# Patient Record
Sex: Female | Born: 1955 | Race: White | Hispanic: No | Marital: Married | Smoking: Never smoker
Health system: Southern US, Community
[De-identification: ages and names within clinical notes are randomized; demographics above are authoritative.]

## PROBLEM LIST (undated history)

## (undated) DIAGNOSIS — J302 Other seasonal allergic rhinitis: Secondary | ICD-10-CM

## (undated) DIAGNOSIS — K219 Gastro-esophageal reflux disease without esophagitis: Secondary | ICD-10-CM

## (undated) DIAGNOSIS — C50919 Malignant neoplasm of unspecified site of unspecified female breast: Secondary | ICD-10-CM

## (undated) HISTORY — PX: VEIN SURGERY: SHX48

## (undated) HISTORY — PX: BREAST BIOPSY: SHX20

## (undated) HISTORY — PX: BREAST EXCISIONAL BIOPSY: SUR124

## (undated) HISTORY — PX: ABDOMINAL HYSTERECTOMY: SHX81

## (undated) HISTORY — PX: BREAST LUMPECTOMY: SHX2

---

## 2003-09-12 ENCOUNTER — Ambulatory Visit (HOSPITAL_BASED_OUTPATIENT_CLINIC_OR_DEPARTMENT_OTHER): Admission: RE | Admit: 2003-09-12 | Discharge: 2003-09-12 | Payer: Self-pay | Admitting: Family Medicine

## 2004-06-12 ENCOUNTER — Encounter: Admission: RE | Admit: 2004-06-12 | Discharge: 2004-06-12 | Payer: Self-pay | Admitting: Family Medicine

## 2004-08-17 ENCOUNTER — Ambulatory Visit (HOSPITAL_COMMUNITY): Admission: RE | Admit: 2004-08-17 | Discharge: 2004-08-17 | Payer: Self-pay | Admitting: Gastroenterology

## 2005-06-20 ENCOUNTER — Encounter: Admission: RE | Admit: 2005-06-20 | Discharge: 2005-06-20 | Payer: Self-pay | Admitting: Family Medicine

## 2005-09-11 ENCOUNTER — Encounter: Admission: RE | Admit: 2005-09-11 | Discharge: 2005-09-11 | Payer: Self-pay | Admitting: Family Medicine

## 2006-10-18 ENCOUNTER — Encounter: Admission: RE | Admit: 2006-10-18 | Discharge: 2006-10-18 | Payer: Self-pay | Admitting: Family Medicine

## 2007-10-22 ENCOUNTER — Ambulatory Visit (HOSPITAL_COMMUNITY): Admission: RE | Admit: 2007-10-22 | Discharge: 2007-10-22 | Payer: Self-pay | Admitting: Family Medicine

## 2008-10-25 ENCOUNTER — Ambulatory Visit (HOSPITAL_COMMUNITY): Admission: RE | Admit: 2008-10-25 | Discharge: 2008-10-25 | Payer: Self-pay | Admitting: Family Medicine

## 2008-11-01 ENCOUNTER — Encounter: Admission: RE | Admit: 2008-11-01 | Discharge: 2008-11-01 | Payer: Self-pay | Admitting: Family Medicine

## 2009-04-25 ENCOUNTER — Encounter: Admission: RE | Admit: 2009-04-25 | Discharge: 2009-04-25 | Payer: Self-pay | Admitting: Family Medicine

## 2009-06-21 ENCOUNTER — Emergency Department (HOSPITAL_COMMUNITY): Admission: EM | Admit: 2009-06-21 | Discharge: 2009-06-21 | Payer: Self-pay | Admitting: Family Medicine

## 2009-11-09 ENCOUNTER — Encounter: Admission: RE | Admit: 2009-11-09 | Discharge: 2009-11-09 | Payer: Self-pay | Admitting: Family Medicine

## 2010-06-09 NOTE — Procedures (Signed)
NAME:  Kendra Norman, Kendra Norman           ACCOUNT NO.:  [id number]   MEDICAL RECORD NO.:  [id number]          PATIENT TYPE:  OUT   LOCATION:  SLEEP CENTER                 FACILITY:  [facility]   PHYSICIAN:  Clinton D. Maple Hudson, M.D. DATE OF BIRTH:  [birth year]   DATE OF ADMISSION:  09/12/2003  DATE OF DISCHARGE:  09/12/2003                              NOCTURNAL POLYSOMNOGRAM   REFERRING PHYSICIAN:  Dr. Maryelizabeth Rowan   INDICATION FOR STUDY:  Hypersomnia with sleep apnea, complaint of snoring.   EPWORTH SLEEPINESS SCORE:  10/24   NECK SIZE:  14 inches   BODY MASS INDEX:  27   WEIGHT:  190 pounds   MEDICATION LIST:  Prilosec, Zyrtec, Estratest, Naprosyn, multivitamins,  Tylenol, Flex-a-min.  No sleep med was taken for this study.   SLEEP ARCHITECTURE:  Total sleep time 338 minutes with sleep efficiency 65%.  Stage I was 15%, Stage II 68%, Stages III and IV 5%, REM was 12% of total  sleep time.  Latency to sleep onset 118 minutes.  Latency to REM was 223  minutes.  Awake after sleep onset 72 minutes.  Arousal index 36.9/hr.   RESPIRATORY DATA:  RDI 2.5/hr reflecting a total of 14 hypopneas.  This is  within normal limits.  Events were not positional.  She slept mostly on her  left side.  REM RDI was 2.9/hr.   OXYGEN DATA:  Mild snoring with normal oxygenation.  Mean oxygen saturation  through the study was 98% and lowest recorded saturation was 91%.   CARDIAC DATA:  Normal sinus rhythm.   MOVEMENT/PARASOMNIA:  One hundred fourteen limb jerks were recorded of which  27 were associated with arousal or awakening for a periodic limb movement  with arousal index of 4.8/hr.  Bathroom x1.   IMPRESSION/RECOMMENDATION:  Unremarkable respiratory pattern with occasional  hypopneas, respiratory disturbance index 2.5/hr which is within normal  limits.  Mild snoring and normal oxygenation.  Sleep was quite fragmented by  nonspecific brief arousals and awakenings.  If this is representative of her  home pattern she may  benefit from treatment for insomnia.  Periodic limb movement syndrome was  recorded with an arousal index of 4.8/hr.  Consider a therapeutic trial of  clonazepam or Requip if this appears clinically significant.                                   ______________________________                                Rennis Chris. Maple Hudson, M.D.                                Diplomate, American Board of Sleep Medicine    CDY/MEDQ  D:  09/19/2003 10:24:10  T:  09/20/2003 12:03:35  Job:  [id number]

## 2010-10-06 ENCOUNTER — Other Ambulatory Visit (HOSPITAL_COMMUNITY): Payer: Self-pay | Admitting: Family Medicine

## 2010-10-06 DIAGNOSIS — Z1231 Encounter for screening mammogram for malignant neoplasm of breast: Secondary | ICD-10-CM

## 2010-11-14 ENCOUNTER — Ambulatory Visit (HOSPITAL_COMMUNITY)
Admission: RE | Admit: 2010-11-14 | Discharge: 2010-11-14 | Disposition: A | Discharge status: Home / Self Care | Source: Ambulatory Visit | Attending: Family Medicine | Admitting: Family Medicine

## 2010-11-14 DIAGNOSIS — Z1231 Encounter for screening mammogram for malignant neoplasm of breast: Secondary | ICD-10-CM | POA: Insufficient documentation

## 2010-11-23 ENCOUNTER — Inpatient Hospital Stay (INDEPENDENT_AMBULATORY_CARE_PROVIDER_SITE_OTHER)
Admission: RE | Admit: 2010-11-23 | Discharge: 2010-11-23 | Disposition: A | Discharge status: Home / Self Care | Source: Ambulatory Visit | Attending: Family Medicine | Admitting: Family Medicine

## 2010-11-23 DIAGNOSIS — M7020 Olecranon bursitis, unspecified elbow: Secondary | ICD-10-CM

## 2011-04-25 ENCOUNTER — Emergency Department (INDEPENDENT_AMBULATORY_CARE_PROVIDER_SITE_OTHER)

## 2011-04-25 ENCOUNTER — Emergency Department (INDEPENDENT_AMBULATORY_CARE_PROVIDER_SITE_OTHER)
Admission: EM | Admit: 2011-04-25 | Discharge: 2011-04-25 | Disposition: A | Discharge status: Home / Self Care | Source: Home / Self Care | Attending: Emergency Medicine | Admitting: Emergency Medicine

## 2011-04-25 ENCOUNTER — Encounter (HOSPITAL_COMMUNITY): Payer: Self-pay | Admitting: Emergency Medicine

## 2011-04-25 DIAGNOSIS — IMO0002 Reserved for concepts with insufficient information to code with codable children: Secondary | ICD-10-CM

## 2011-04-25 DIAGNOSIS — S62619A Displaced fracture of proximal phalanx of unspecified finger, initial encounter for closed fracture: Secondary | ICD-10-CM

## 2011-04-25 HISTORY — DX: Gastro-esophageal reflux disease without esophagitis: K21.9

## 2011-04-25 HISTORY — DX: Other seasonal allergic rhinitis: J30.2

## 2011-04-25 MED ORDER — IBUPROFEN 600 MG PO TABS: 600.0000 mg | ORAL_TABLET | Freq: Four times a day (QID) | ORAL | Status: AC | PRN

## 2011-04-25 MED ORDER — OXYCODONE-ACETAMINOPHEN 5-325 MG PO TABS: 1.0000 | ORAL_TABLET | Freq: Four times a day (QID) | ORAL | Status: AC | PRN

## 2011-04-25 NOTE — ED Provider Notes (Signed)
History     CSN: [id number]  Arrival date & time 04/25/11  0800   First MD Initiated Contact with Patient 04/25/11 0805      Chief Complaint  Patient presents with  . Hand Pain    (Consider location/radiation/quality/duration/timing/severity/associated sxs/prior treatment) HPI Comments: Patient is a right-handed female who states that she was running, and fell on the lateral aspect of her left hand last night. Also sustained multiple superficial abrasions on arm, legs. Now reports pain, bruising, swelling at the fifth metacarpal and at the left little finger. No numbness, paresthesias, weakness. Recent hand, wrist, forearm, elbow without injury. Patient has been applying ice and took Tylenol last night with mild relief. Pain is worse with use and palpation.  ROS as noted in HPI. All other ROS negative.   Patient is a 56 y.o. female presenting with hand pain. The history is provided by the patient.  Hand Pain This is a new problem. The current episode started yesterday. The problem occurs constantly. The problem has not changed since onset.She has tried acetaminophen for the symptoms.    Past Medical History  Diagnosis Date  . GERD (gastroesophageal reflux disease)   . Seasonal allergies     Past Surgical History  Procedure Date  . Abdominal hysterectomy     History reviewed. No pertinent family history.  History  Substance Use Topics  . Smoking status: Never Smoker   . Smokeless tobacco: Not on file  . Alcohol Use: Yes    OB History    Grav Para Term Preterm Abortions TAB SAB Ect Mult Living                  Review of Systems  Allergies  Review of patient's allergies indicates no known allergies.  Home Medications   Current Outpatient Rx  Name Route Sig Dispense Refill  . EST ESTROGENS-METHYLTEST 0.625-1.25 MG PO TABS Oral Take 1 tablet by mouth daily.    Marland Kitchen OMEPRAZOLE 10 MG PO CPDR Oral Take 10 mg by mouth daily.    Marland Kitchen PREDNISONE 2.5 MG PO TABS Oral Take  2.5 mg by mouth daily.    . IBUPROFEN 600 MG PO TABS Oral Take 1 tablet (600 mg total) by mouth every 6 (six) hours as needed for pain. 30 tablet 0  . OXYCODONE-ACETAMINOPHEN 5-325 MG PO TABS Oral Take 1-2 tablets by mouth every 6 (six) hours as needed for pain. 20 tablet 0    BP 124/77  Pulse 84  Temp(Src) 97 F (36.1 C) (Oral)  Resp 18  SpO2 100%  Physical Exam  Nursing note and vitals reviewed. Constitutional: She is oriented to person, place, and time. She appears well-developed and well-nourished. No distress.  HENT:  Head: Normocephalic and atraumatic.  Eyes: Conjunctivae and EOM are normal.  Neck: Normal range of motion.  Cardiovascular: Normal rate.   Pulmonary/Chest: Effort normal.  Abdominal: She exhibits no distension.  Musculoskeletal: Normal range of motion.       Hands:      Bruising, tenderness at fifth MCP and base of left little finger, PIP. Motor and sensation otherwise intact in median/radial/ulnar distribution. Refill is 2 seconds. Wrist and hand, wrist, forearm, elbow within normal limits.  Neurological: She is alert and oriented to person, place, and time.  Skin: Skin is warm and dry.  Psychiatric: She has a normal mood and affect. Her behavior is normal. Judgment and thought content normal.    ED Course  Procedures (including critical care time)  Labs  Reviewed - No data to display Dg Hand Complete Left  04/25/2011  *RADIOLOGY REPORT*  Clinical Data: Fall, pain, swelling laterally  LEFT HAND - COMPLETE 3+ VIEW  Comparison: None.  Findings: There is an acute transverse nondisplaced fracture of the left fifth finger proximal phalanx close to the MCP joint. Fracture does not appear intra-articular.  No associated subluxation or dislocation.  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  IMPRESSION: Acute nondisplaced fracture left fifth proximal phalanx.  Osteoarthritis  Original Report Authenticated By: Judie Petit. Ruel Favors, M.D.     1. Fracture of proximal phalanx of left hand     X-ray reviewed by myself. Nondisplaced Fracture base of proximal phalanx. Full report per radiologist  MDM  Patient has bruising, tenderness at MCP, PIP. Will place in a ulnar gutter splint, even if patient has no fracture Patient declined medication. X-ray to rule out fracture.  Discussed imaging results with the patient. Will have her followup with Dr. Mina Marble, hand on call, in several days. Patient agrees with plan  Luiz Blare, MD 04/25/11 [phone]

## 2011-04-25 NOTE — Discharge Instructions (Signed)
Take the medication as written. Take 1 gram of tylenol with the motrin up to 4 times a day as needed for pain and fever. This is an effective combination for pain. Take the percocet only for severe pain. Do not take the tylenol and percocet as they both have tylenol in them and too much can hurt your liver. Return if you get worse, have a  fever >100.4, or for any concerns.   Go to www.goodrx.com to look up your medications. This will give you a list of where you can find your prescriptions at the most affordable prices.

## 2011-04-25 NOTE — ED Notes (Signed)
Ppt. Stated, I was running and I fell and i have some abrasions everywhere, but my lt. Little finger hurts the worse

## 2011-10-11 ENCOUNTER — Ambulatory Visit (INDEPENDENT_AMBULATORY_CARE_PROVIDER_SITE_OTHER): Admitting: Sports Medicine

## 2011-10-11 ENCOUNTER — Ambulatory Visit
Admission: RE | Admit: 2011-10-11 | Discharge: 2011-10-11 | Disposition: A | Discharge status: Home / Self Care | Source: Ambulatory Visit | Attending: Sports Medicine | Admitting: Sports Medicine

## 2011-10-11 VITALS — BP 110/70 | Ht 70.0 in | Wt 186.0 lb

## 2011-10-11 DIAGNOSIS — M545 Low back pain, unspecified: Secondary | ICD-10-CM

## 2011-10-12 NOTE — Progress Notes (Addendum)
  Subjective:    Patient ID: Kendra Norman, female    DOB: 11/01/1955, 56 y.o.   MRN: [id number]  HPI chief complaint: Low back and left hamstring pain  56 year old female comes in today complaining of several months of low back pain and left hamstring pain. Back in 2009 she had an episode of low back pain that responded to oral prednisone. Was asymptomatic up until last October. While running her left leg gave way and she fell hyperextending her left knee and hyperflexing her left hip. Since that time she's had intermittent pain is worse with activity. She enjoys running and noted the pain is worse with running [place]. She also has pain with going from a seated to standing position. Pain is aching in quality and localized across the low back. Pain will radiate into the left hamstring and down to the left knee. No radiating pain past the knee. No associated numbness or tingling. No groin pain. Patient states that she had x-rays back in 2009 which were normal per her report. They were x-rays of her lumbar spine. Her primary care physician has recently placed her on oral prednisone but has been only minimally effective. She takes 500 mg of Naprosyn daily and this does seem to help somewhat her pain.  Medical history significant for reflux disease, restless leg syndrome, and seasonal allergies. Medications include Prilosec, Estratest Claritin-D, Naprosyn, glucosamine, aspirin, calcium, multivitamin, Colace, and MiraLax. She also takes Valium 2.5 mg when necessary for muscle spasm No known drug allergies Socially she does not smoke, drinks alcohol on occasion. And works as an Charity fundraiser for [employer]    Review of Systems     Objective:   Physical Exam Well-developed, well-nourished. No acute distress. Awake alert and oriented x3. Vital signs are reviewed.  Lumbar spine shows full lumbar mobility. Left-sided low back pain with extension. No tenderness to palpation or percussion along the lumbar midline. No  spasm. Left hip demonstrates smooth painless hip range of motion with a negative log roll. No tenderness over the greater trochanteric bursa. She has a negative straight leg raise bilaterally. Strength is 5/5 both lower extremities. Reflexes are trace but equal at the Achilles and patellar tendons bilaterally. Sensation is intact to light-touch distally. Left hamstring shows no palpable tenderness or defect. Good hamstring strength. No tenderness at the ischial tuberosity.        Assessment & Plan:  1. Low back pain with radiating pain into the left hamstring  I am suspicious that facet arthropathy may be responsible for her discomfort. She has no pain with flexion of her lumbar spine and all of her symptoms seem to be reproducible with extension. I've asked that she modify her workouts and avoid any activity that places her back and extension. I think she should also try to avoid hills when running. I'm going to get an AP and lateral x-ray of her lumbar spine and I'll see her back in the office in 4 weeks. If her symptoms persist and her pain warrants, we may consider merits of further diagnostic imaging of her lumbar spine.

## 2011-10-23 ENCOUNTER — Telehealth: Payer: Self-pay | Admitting: *Deleted

## 2011-10-23 NOTE — Telephone Encounter (Signed)
Message copied by Mora Bellman on Tue Oct 23, 2011  3:29 PM ------      Message from: Ralene Cork      Created: Tue Oct 23, 2011  2:51 PM      Regarding: RE: Herby Abraham RESULTS      Contact: [phone]       Xrays show a little arthritis but not bad. Tell her to be sure to follow up with me as scheduled.      ----- Message -----         From: Mora Bellman, RN         Sent: 10/23/2011  10:35 AM           To: Ralene Cork, DO      Subject: Annell Greening: XRAY RESULTS                                                     ----- Message -----         From: Lizbeth Bark         Sent: 10/23/2011  10:06 AM           To: Mora Bellman, RN      Subject: XRAY RESULTS                                             Pt would like her xray results.  She saw Dr. Margaretha Sheffield

## 2011-10-23 NOTE — Telephone Encounter (Signed)
Spoke with pt- gave her x-ray results.  She states her back is still bothering her, she will discuss with Dr. Margaretha Sheffield 11/07/11.

## 2011-10-27 ENCOUNTER — Encounter (HOSPITAL_COMMUNITY): Payer: Self-pay | Admitting: *Deleted

## 2011-10-27 ENCOUNTER — Emergency Department (INDEPENDENT_AMBULATORY_CARE_PROVIDER_SITE_OTHER)
Admission: EM | Admit: 2011-10-27 | Discharge: 2011-10-27 | Disposition: A | Discharge status: Home / Self Care | Source: Home / Self Care | Attending: Family Medicine | Admitting: Family Medicine

## 2011-10-27 DIAGNOSIS — M545 Low back pain, unspecified: Secondary | ICD-10-CM

## 2011-10-27 DIAGNOSIS — M62830 Muscle spasm of back: Secondary | ICD-10-CM

## 2011-10-27 DIAGNOSIS — M5380 Other specified dorsopathies, site unspecified: Secondary | ICD-10-CM

## 2011-10-27 MED ORDER — NAPROXEN 500 MG PO TABS: 500.0000 mg | ORAL_TABLET | Freq: Two times a day (BID) | ORAL | Status: DC

## 2011-10-27 MED ORDER — HYDROCODONE-ACETAMINOPHEN 5-500 MG PO TABS: 1.0000 | ORAL_TABLET | Freq: Three times a day (TID) | ORAL | Status: DC | PRN

## 2011-10-27 MED ORDER — CYCLOBENZAPRINE HCL 10 MG PO TABS: 10.0000 mg | ORAL_TABLET | Freq: Two times a day (BID) | ORAL | Status: DC | PRN

## 2011-10-27 NOTE — ED Provider Notes (Signed)
History     CSN: [id number]  Arrival date & time 10/27/11  1041   First MD Initiated Contact with Patient 10/27/11 1139      Chief Complaint  Patient presents with  . Back Pain    (Consider location/radiation/quality/duration/timing/severity/associated sxs/prior treatment) HPI Comments: 56 year old female with history of chronic low back pain. Comes today complaining of low back pain exacerbation since yesterday. Patient reports she woke up with pain in both sides of her low back but more on the left. Pain worse and with movement. Denies dysuria, frequency or hematuria. No headache fever or chills. She denies any known injury or recent falls. No recent pulling or lifting. She is a runner but has been cutting on her running routine due to intermittent back pain episodes. She's been followed by a orthopedist for chronic back pain. Has taken naproxen heating pads with short lived relief. Denies pain radiation to lower extremities. Denies  numbnes low extremity numbness,  tingling or weakness   Past Medical History  Diagnosis Date  . GERD (gastroesophageal reflux disease)   . Seasonal allergies     Past Surgical History  Procedure Date  . Abdominal hysterectomy   . Vein surgery   . Breast biopsy     No family history on file.  History  Substance Use Topics  . Smoking status: Never Smoker   . Smokeless tobacco: Not on file  . Alcohol Use: Yes    OB History    Grav Para Term Preterm Abortions TAB SAB Ect Mult Living                  Review of Systems  Constitutional: Negative for fever, chills and appetite change.  Gastrointestinal: Negative for nausea, vomiting, abdominal pain and diarrhea.  Genitourinary: Negative for dysuria, hematuria and flank pain.  Musculoskeletal: Positive for back pain.  Skin: Negative for rash.  Neurological: Negative for headaches.  All other systems reviewed and are negative.    Allergies  Review of patient's allergies indicates no  known allergies.  Home Medications   Current Outpatient Rx  Name Route Sig Dispense Refill  . ASPIRIN 81 MG PO TABS Oral Take 81 mg by mouth daily.    . CYCLOBENZAPRINE HCL 10 MG PO TABS Oral Take 1 tablet (10 mg total) by mouth 2 (two) times daily as needed for muscle spasms. 20 tablet 0  . EST ESTROGENS-METHYLTEST 0.625-1.25 MG PO TABS Oral Take 1 tablet by mouth daily.    Marland Kitchen HYDROCODONE-ACETAMINOPHEN 5-500 MG PO TABS Oral Take 1 tablet by mouth every 8 (eight) hours as needed for pain. 15 tablet 0  . NAPROXEN 500 MG PO TABS Oral Take 1 tablet (500 mg total) by mouth 2 (two) times daily with a meal. 20 tablet 0  . OMEPRAZOLE 10 MG PO CPDR Oral Take 10 mg by mouth daily.    Marland Kitchen PREDNISONE 2.5 MG PO TABS Oral Take 2.5 mg by mouth daily.      BP 120/67  Pulse 81  Temp 97.9 F (36.6 C) (Oral)  Resp 16  SpO2 100%  Physical Exam  Nursing note and vitals reviewed. Constitutional: She is oriented to person, place, and time. She appears well-developed and well-nourished. No distress.  HENT:  Head: Normocephalic and atraumatic.  Neck: Neck supple.  Cardiovascular: Normal heart sounds.   Pulmonary/Chest: Breath sounds normal.  Abdominal: Soft. There is no tenderness.       No CVT  Musculoskeletal:       Spine central:  No obvious scoliosis or kyphosis. No pain over bone processes in entire spine.  Good range of motion.  Able to bend forward (flexion) past 90 degrees and posteriorly (extension) 25 degrees.  Normal bilateral lateralization 25 degress. Despite good range of motion there is reported pain in left lower lateral back with lateralization towards left side specially if rotation of torso in same direction at same time. Increased muscle tone and tenderness to palpation in low lumbar  Paravertebral muscles.      Neurological: She is alert and oriented to person, place, and time.       Symmtric DTRs bilateral in lower extremities. No saddle anesthesia Intact superficial and deep  proprioception in bilateral lower extremities.  Skin: No rash noted.    ED Course  Procedures (including critical care time)  Labs Reviewed - No data to display No results found.   1. Low back pain   2. Spasm of muscle, back       MDM  Normal neurologic examination. Prescribed Flexeril and Vicodin. Refilled naproxen. Low back rehabilitation exercises discussed with patient and provided in writing. Supportive/preventive care discussed with patient and provided in writing. Asked to followup with her orthopedist if not improving or worsening symptoms despite following treatment        Sharin Grave, MD 10/30/11 1146

## 2011-10-27 NOTE — ED Notes (Signed)
Pt  Reports     Low  Back  Pain         - she  Reports       She  Woke up  With  The  Symptoms       This  Am       She  denys  Any  spedefic  Injury       She  Reports  Symptoms  Not  releived   By  otc  meds      And  Heat     -  Pt  Has  History  Of  Back  Problems  In  Past          - pt  denys  Any  Urinary  Symptoms      -  Pt         Reports  Pain is  Worse  On  Movement

## 2011-10-29 ENCOUNTER — Other Ambulatory Visit (HOSPITAL_COMMUNITY): Payer: Self-pay | Admitting: Family Medicine

## 2011-10-29 DIAGNOSIS — Z1231 Encounter for screening mammogram for malignant neoplasm of breast: Secondary | ICD-10-CM

## 2011-10-30 ENCOUNTER — Ambulatory Visit (INDEPENDENT_AMBULATORY_CARE_PROVIDER_SITE_OTHER): Admitting: Sports Medicine

## 2011-10-30 ENCOUNTER — Encounter: Payer: Self-pay | Admitting: Sports Medicine

## 2011-10-30 VITALS — BP 146/90 | HR 83 | Ht 70.0 in | Wt 186.0 lb

## 2011-10-30 DIAGNOSIS — M545 Low back pain, unspecified: Secondary | ICD-10-CM

## 2011-10-30 NOTE — Progress Notes (Signed)
  Subjective:    Patient ID: Kendra Norman, female    DOB: [birth year], 56 y.o.   MRN: [id number]  HPI Patient comes in today with worsening low back pain. Recent x-rays showed some minimal degenerative changes but nothing severe. She had an acute episode of back spasms which required a visit to the urgent care last weekend. She was given prescriptions for Naprosyn, Flexeril, and Vicodin. She has not been taking the Vicodin. Her pain is worse with activity particularly with running even though she may be modifications I suggested and has been running a softer surface and avoiding hills. Still getting pain in the left hamstring with running as well but no associated numbness or tingling.    Review of Systems     Objective:   Physical Exam Well-developed, well-nourished. No acute distress. Awake alert and oriented x3  Lumbar spine shows full range of motion but pain with flexion and extension. She has a mild amount of spasm diffusely of the paraspinal muscles. Positive straight leg raise on the left, negative on the right.. Strength is 5/5 both lower extremities. Reflexes are trace but equal at the Achilles and patellar tendons bilaterally. Sensation is intact to light-touch grossly. Palpation of the left hamstring shows no palpable defect. No tenderness to palpation. No soft tissue swelling. Patient walks without a severe limp       Assessment & Plan:  1. 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 After reviewing that study I will give her a call. The pain in her left hamstring may be referred from her lumbar spine. We may discuss the merits of a diagnostic/therapeutic lumbar ESI. She will continue on her Naprosyn and when necessary Flexeril. She will avoid aggravating activities and we will delineate further treatment based on her MRI findings.

## 2011-10-30 NOTE — Patient Instructions (Addendum)
You have been scheduled for an appointment for MRI of your lumbar spine on 11/01/11 at 1pm at [facility].  Please arrive at radiology on 1st floor at 12:45pm.    Dr. Margaretha Sheffield will contact you with MRI results  Thank you for seeing [place] today!

## 2011-11-01 ENCOUNTER — Ambulatory Visit (HOSPITAL_COMMUNITY)
Admission: RE | Admit: 2011-11-01 | Discharge: 2011-11-01 | Disposition: A | Discharge status: Home / Self Care | Source: Ambulatory Visit | Attending: Sports Medicine | Admitting: Sports Medicine

## 2011-11-01 DIAGNOSIS — IMO0002 Reserved for concepts with insufficient information to code with codable children: Secondary | ICD-10-CM | POA: Insufficient documentation

## 2011-11-01 DIAGNOSIS — M545 Low back pain, unspecified: Secondary | ICD-10-CM

## 2011-11-01 DIAGNOSIS — D1809 Hemangioma of other sites: Secondary | ICD-10-CM | POA: Insufficient documentation

## 2011-11-05 ENCOUNTER — Telehealth: Payer: Self-pay | Admitting: *Deleted

## 2011-11-05 ENCOUNTER — Telehealth: Payer: Self-pay | Admitting: Sports Medicine

## 2011-11-05 NOTE — Telephone Encounter (Signed)
I spoke with the patient on the phone today regarding MRI findings of her lumbar spine. She has multilevel degenerative disc disease with some mild foraminal impingement but nothing marked. I still think she may benefit from a lumbar ESI both for diagnostic as well as therapeutic reasons. At this point, I've elected to refer her to Dr. Maurice Small for his input. I will refer her for evaluation and treatment and I will defer further treatment to his discretion.

## 2011-11-05 NOTE — Telephone Encounter (Signed)
Message copied by Mora Bellman on Mon Nov 05, 2011  4:14 PM ------      Message from: Ralene Cork      Created: Mon Nov 05, 2011  1:27 PM      Regarding: dr Maurice Small       Please refer to Dr Maurice Small for evaluation and treatment of lumbar DDD      ----- Message -----         From: Rad Results In Interface         Sent: 11/01/2011   2:31 PM           To: Ralene Cork, DO

## 2011-11-05 NOTE — Telephone Encounter (Signed)
Referred tues 11/12 @ 1:30pm for paperwork  Dr. Maurice Small @ [facility] Left pt a VM regarding appt info.

## 2011-11-07 ENCOUNTER — Ambulatory Visit: Admitting: Sports Medicine

## 2011-11-07 NOTE — Telephone Encounter (Signed)
Called back to [facility]- they said have pt call Monday and might be able to work her in this Tues with Dr. Maurice Small.  If not- moved her appt to 11/29/11 at 9am.  Pt notified.

## 2011-11-20 ENCOUNTER — Ambulatory Visit (HOSPITAL_COMMUNITY)
Admission: RE | Admit: 2011-11-20 | Discharge: 2011-11-20 | Disposition: A | Discharge status: Home / Self Care | Source: Ambulatory Visit | Attending: Family Medicine | Admitting: Family Medicine

## 2011-11-20 DIAGNOSIS — Z1231 Encounter for screening mammogram for malignant neoplasm of breast: Secondary | ICD-10-CM | POA: Insufficient documentation

## 2012-01-18 ENCOUNTER — Ambulatory Visit: Attending: Physical Medicine and Rehabilitation | Admitting: Physical Therapy

## 2012-01-18 DIAGNOSIS — M545 Low back pain, unspecified: Secondary | ICD-10-CM | POA: Insufficient documentation

## 2012-01-18 DIAGNOSIS — IMO0001 Reserved for inherently not codable concepts without codable children: Secondary | ICD-10-CM | POA: Insufficient documentation

## 2012-01-21 ENCOUNTER — Ambulatory Visit: Admitting: Physical Therapy

## 2012-01-25 ENCOUNTER — Ambulatory Visit: Attending: Physical Medicine and Rehabilitation

## 2012-01-25 DIAGNOSIS — M545 Low back pain, unspecified: Secondary | ICD-10-CM | POA: Insufficient documentation

## 2012-01-25 DIAGNOSIS — IMO0001 Reserved for inherently not codable concepts without codable children: Secondary | ICD-10-CM | POA: Insufficient documentation

## 2012-01-29 ENCOUNTER — Ambulatory Visit: Admitting: Physical Therapy

## 2012-02-01 ENCOUNTER — Ambulatory Visit: Admitting: Physical Therapy

## 2012-02-04 ENCOUNTER — Ambulatory Visit: Admitting: Physical Therapy

## 2012-02-07 ENCOUNTER — Ambulatory Visit: Admitting: Physical Therapy

## 2012-02-12 ENCOUNTER — Ambulatory Visit: Admitting: Physical Therapy

## 2012-02-15 ENCOUNTER — Ambulatory Visit: Admitting: Physical Therapy

## 2012-02-19 ENCOUNTER — Ambulatory Visit: Admitting: Physical Therapy

## 2012-02-26 ENCOUNTER — Ambulatory Visit: Attending: Sports Medicine | Admitting: Physical Therapy

## 2012-02-26 DIAGNOSIS — IMO0001 Reserved for inherently not codable concepts without codable children: Secondary | ICD-10-CM | POA: Insufficient documentation

## 2012-02-26 DIAGNOSIS — M545 Low back pain, unspecified: Secondary | ICD-10-CM | POA: Insufficient documentation

## 2012-03-04 ENCOUNTER — Ambulatory Visit: Admitting: Physical Therapy

## 2012-03-08 ENCOUNTER — Other Ambulatory Visit: Payer: Self-pay

## 2012-10-27 ENCOUNTER — Other Ambulatory Visit (HOSPITAL_COMMUNITY): Payer: Self-pay | Admitting: Family Medicine

## 2012-10-27 DIAGNOSIS — Z1231 Encounter for screening mammogram for malignant neoplasm of breast: Secondary | ICD-10-CM

## 2012-11-21 ENCOUNTER — Ambulatory Visit (HOSPITAL_COMMUNITY)
Admission: RE | Admit: 2012-11-21 | Discharge: 2012-11-21 | Disposition: A | Discharge status: Home / Self Care | Source: Ambulatory Visit | Attending: Family Medicine | Admitting: Family Medicine

## 2012-11-21 DIAGNOSIS — Z1231 Encounter for screening mammogram for malignant neoplasm of breast: Secondary | ICD-10-CM | POA: Insufficient documentation

## 2012-11-27 ENCOUNTER — Other Ambulatory Visit: Payer: Self-pay

## 2013-07-09 ENCOUNTER — Ambulatory Visit: Attending: Family Medicine | Admitting: Physical Therapy

## 2013-07-09 DIAGNOSIS — M545 Low back pain, unspecified: Secondary | ICD-10-CM | POA: Insufficient documentation

## 2013-07-09 DIAGNOSIS — IMO0001 Reserved for inherently not codable concepts without codable children: Secondary | ICD-10-CM | POA: Insufficient documentation

## 2013-07-09 DIAGNOSIS — M25659 Stiffness of unspecified hip, not elsewhere classified: Secondary | ICD-10-CM | POA: Diagnosis not present

## 2013-07-15 ENCOUNTER — Ambulatory Visit: Admitting: Physical Therapy

## 2013-07-15 DIAGNOSIS — IMO0001 Reserved for inherently not codable concepts without codable children: Secondary | ICD-10-CM | POA: Diagnosis not present

## 2013-07-31 ENCOUNTER — Ambulatory Visit: Attending: Family Medicine | Admitting: Physical Therapy

## 2013-07-31 DIAGNOSIS — M25659 Stiffness of unspecified hip, not elsewhere classified: Secondary | ICD-10-CM | POA: Insufficient documentation

## 2013-07-31 DIAGNOSIS — M545 Low back pain, unspecified: Secondary | ICD-10-CM | POA: Insufficient documentation

## 2013-07-31 DIAGNOSIS — IMO0001 Reserved for inherently not codable concepts without codable children: Secondary | ICD-10-CM | POA: Insufficient documentation

## 2013-08-10 ENCOUNTER — Ambulatory Visit: Admitting: Physical Therapy

## 2013-08-18 ENCOUNTER — Ambulatory Visit: Admitting: Physical Therapy

## 2013-08-24 ENCOUNTER — Ambulatory Visit: Admitting: Physical Therapy

## 2013-08-27 ENCOUNTER — Ambulatory Visit: Attending: Family Medicine | Admitting: Physical Therapy

## 2013-08-27 DIAGNOSIS — M25659 Stiffness of unspecified hip, not elsewhere classified: Secondary | ICD-10-CM | POA: Insufficient documentation

## 2013-08-27 DIAGNOSIS — M545 Low back pain, unspecified: Secondary | ICD-10-CM | POA: Diagnosis not present

## 2013-08-27 DIAGNOSIS — IMO0001 Reserved for inherently not codable concepts without codable children: Secondary | ICD-10-CM | POA: Insufficient documentation

## 2013-08-31 ENCOUNTER — Ambulatory Visit: Admitting: Physical Therapy

## 2013-08-31 DIAGNOSIS — IMO0001 Reserved for inherently not codable concepts without codable children: Secondary | ICD-10-CM | POA: Diagnosis not present

## 2013-09-08 ENCOUNTER — Ambulatory Visit: Admitting: Physical Therapy

## 2013-09-08 DIAGNOSIS — IMO0001 Reserved for inherently not codable concepts without codable children: Secondary | ICD-10-CM | POA: Diagnosis not present

## 2013-09-11 ENCOUNTER — Encounter: Admitting: Physical Therapy

## 2013-09-15 ENCOUNTER — Ambulatory Visit: Admitting: Physical Therapy

## 2013-09-15 DIAGNOSIS — IMO0001 Reserved for inherently not codable concepts without codable children: Secondary | ICD-10-CM | POA: Diagnosis not present

## 2013-09-18 ENCOUNTER — Ambulatory Visit: Admitting: Physical Therapy

## 2013-09-18 DIAGNOSIS — IMO0001 Reserved for inherently not codable concepts without codable children: Secondary | ICD-10-CM | POA: Diagnosis not present

## 2013-10-01 ENCOUNTER — Ambulatory Visit: Attending: Family Medicine | Admitting: Physical Therapy

## 2013-10-01 DIAGNOSIS — IMO0001 Reserved for inherently not codable concepts without codable children: Secondary | ICD-10-CM | POA: Diagnosis not present

## 2013-10-01 DIAGNOSIS — M25659 Stiffness of unspecified hip, not elsewhere classified: Secondary | ICD-10-CM | POA: Insufficient documentation

## 2013-10-01 DIAGNOSIS — M545 Low back pain, unspecified: Secondary | ICD-10-CM | POA: Insufficient documentation

## 2013-10-06 ENCOUNTER — Ambulatory Visit: Admitting: Physical Therapy

## 2013-10-06 DIAGNOSIS — IMO0001 Reserved for inherently not codable concepts without codable children: Secondary | ICD-10-CM | POA: Diagnosis not present

## 2013-10-15 ENCOUNTER — Ambulatory Visit: Admitting: Physical Therapy

## 2013-10-15 DIAGNOSIS — IMO0001 Reserved for inherently not codable concepts without codable children: Secondary | ICD-10-CM | POA: Diagnosis not present

## 2013-10-19 ENCOUNTER — Other Ambulatory Visit (HOSPITAL_COMMUNITY): Payer: Self-pay | Admitting: Family Medicine

## 2013-10-19 DIAGNOSIS — Z1231 Encounter for screening mammogram for malignant neoplasm of breast: Secondary | ICD-10-CM

## 2013-10-20 ENCOUNTER — Ambulatory Visit: Admitting: Physical Therapy

## 2013-10-20 DIAGNOSIS — IMO0001 Reserved for inherently not codable concepts without codable children: Secondary | ICD-10-CM | POA: Diagnosis not present

## 2013-11-06 ENCOUNTER — Encounter: Admitting: Physical Therapy

## 2013-11-06 ENCOUNTER — Other Ambulatory Visit: Payer: Self-pay

## 2013-11-12 ENCOUNTER — Encounter: Admitting: Physical Therapy

## 2013-11-25 ENCOUNTER — Ambulatory Visit (HOSPITAL_COMMUNITY)

## 2014-01-27 ENCOUNTER — Ambulatory Visit (HOSPITAL_COMMUNITY)
Admission: RE | Admit: 2014-01-27 | Discharge: 2014-01-27 | Disposition: A | Discharge status: Home / Self Care | Source: Ambulatory Visit | Attending: Family Medicine | Admitting: Family Medicine

## 2014-01-27 ENCOUNTER — Ambulatory Visit (HOSPITAL_COMMUNITY)

## 2014-01-27 DIAGNOSIS — Z1231 Encounter for screening mammogram for malignant neoplasm of breast: Secondary | ICD-10-CM | POA: Diagnosis not present

## 2014-03-03 ENCOUNTER — Emergency Department (INDEPENDENT_AMBULATORY_CARE_PROVIDER_SITE_OTHER)
Admission: EM | Admit: 2014-03-03 | Discharge: 2014-03-03 | Disposition: A | Discharge status: Home / Self Care | Source: Home / Self Care | Attending: Family Medicine | Admitting: Family Medicine

## 2014-03-03 ENCOUNTER — Encounter (HOSPITAL_COMMUNITY): Payer: Self-pay

## 2014-03-03 DIAGNOSIS — J209 Acute bronchitis, unspecified: Secondary | ICD-10-CM

## 2014-03-03 HISTORY — DX: Malignant neoplasm of unspecified site of unspecified female breast: C50.919

## 2014-03-03 MED ORDER — AZITHROMYCIN 250 MG PO TABS: ORAL_TABLET | ORAL | Status: DC

## 2014-03-03 MED ORDER — HYDROCOD POLST-CHLORPHEN POLST 10-8 MG/5ML PO LQCR: 5.0000 mL | Freq: Two times a day (BID) | ORAL | Status: DC | PRN

## 2014-03-03 NOTE — Discharge Instructions (Signed)
Only start the antibiotic if you start to feel sicker and develop symptoms of pneumonia that we discussed.  Acute Bronchitis Bronchitis is inflammation of the airways that extend from the windpipe into the lungs (bronchi). The inflammation often causes mucus to develop. This leads to a cough, which is the most common symptom of bronchitis.  In acute bronchitis, the condition usually develops suddenly and goes away over time, usually in a couple weeks. Smoking, allergies, and asthma can make bronchitis worse. Repeated episodes of bronchitis may cause further lung problems.  CAUSES Acute bronchitis is most often caused by the same virus that causes a cold. The virus can spread from person to person (contagious) through coughing, sneezing, and touching contaminated objects. SIGNS AND SYMPTOMS   Cough.   Fever.   Coughing up mucus.   Body aches.   Chest congestion.   Chills.   Shortness of breath.   Sore throat.  DIAGNOSIS  Acute bronchitis is usually diagnosed through a physical exam. Your health care provider will also ask you questions about your medical history. Tests, such as chest X-rays, are sometimes done to rule out other conditions.  TREATMENT  Acute bronchitis usually goes away in a couple weeks. Oftentimes, no medical treatment is necessary. Medicines are sometimes given for relief of fever or cough. Antibiotic medicines are usually not needed but may be prescribed in certain situations. In some cases, an inhaler may be recommended to help reduce shortness of breath and control the cough. A cool mist vaporizer may also be used to help thin bronchial secretions and make it easier to clear the chest.  HOME CARE INSTRUCTIONS  Get plenty of rest.   Drink enough fluids to keep your urine clear or pale yellow (unless you have a medical condition that requires fluid restriction). Increasing fluids may help thin your respiratory secretions (sputum) and reduce chest  congestion, and it will prevent dehydration.   Take medicines only as directed by your health care provider.  If you were prescribed an antibiotic medicine, finish it all even if you start to feel better.  Avoid smoking and secondhand smoke. Exposure to cigarette smoke or irritating chemicals will make bronchitis worse. If you are a smoker, consider using nicotine gum or skin patches to help control withdrawal symptoms. Quitting smoking will help your lungs heal faster.   Reduce the chances of another bout of acute bronchitis by washing your hands frequently, avoiding people with cold symptoms, and trying not to touch your hands to your mouth, nose, or eyes.   Keep all follow-up visits as directed by your health care provider.  SEEK MEDICAL CARE IF: Your symptoms do not improve after 1 week of treatment.  SEEK IMMEDIATE MEDICAL CARE IF:  You develop an increased fever or chills.   You have chest pain.   You have severe shortness of breath.  You have bloody sputum.   You develop dehydration.  You faint or repeatedly feel like you are going to pass out.  You develop repeated vomiting.  You develop a severe headache. MAKE SURE YOU:   Understand these instructions.  Will watch your condition.  Will get help right away if you are not doing well or get worse. Document Released: 02/16/2004 Document Revised: 05/25/2013 Document Reviewed: 07/01/2012 [facility] Patient Information 2015 [place], [place]. This information is not intended to replace advice given to you by your health care provider. Make sure you discuss any questions you have with your health care provider.

## 2014-03-03 NOTE — ED Provider Notes (Signed)
CSN: [id number]     Arrival date & time 03/03/14  0920 History   First MD Initiated Contact with Patient 03/03/14 1019     Chief Complaint  Patient presents with  . URI   (Consider location/radiation/quality/duration/timing/severity/associated sxs/prior Treatment) HPI         59 year old female presents complaining of cough, congestion, sore throat, and bilateral ear discomfort. This initially started one week ago. She initially had chills and body aches but those have mostly got better. She felt like she was getting better but last night the cough got a lot worse and kept her up all night. No chest pain or shortness of breath, no fever, no nausea or vomiting. No recent travel or sick contacts. Over-the-counter medications are not helping significantly with her symptoms  Past Medical History  Diagnosis Date  . GERD (gastroesophageal reflux disease)   . Seasonal allergies   . Breast cancer    Past Surgical History  Procedure Laterality Date  . Abdominal hysterectomy    . Vein surgery    . Breast biopsy     History reviewed. No pertinent family history. History  Substance Use Topics  . Smoking status: Never Smoker   . Smokeless tobacco: Never Used  . Alcohol Use: Yes   OB History    No data available     Review of Systems  HENT: Positive for congestion, ear pain and sore throat. Negative for ear discharge.   Respiratory: Positive for cough.   Cardiovascular: Negative for chest pain.  Gastrointestinal: Negative for nausea, vomiting and diarrhea.  All other systems reviewed and are negative.   Allergies  Review of patient's allergies indicates no known allergies.  Home Medications   Prior to Admission medications   Medication Sig Start Date End Date Taking? Authorizing Provider  aspirin 81 MG tablet Take 81 mg by mouth daily.   Yes Historical Provider, MD  estrogen-methylTESTOSTERone (ESTRATEST HS) 0.625-1.25 MG per tablet Take 1 tablet by mouth daily.   Yes Historical  Provider, MD  loratadine-pseudoephedrine (CLARITIN-D 24-HOUR) 10-240 MG per 24 hr tablet Take 1 tablet by mouth daily.   Yes Historical Provider, MD  naproxen (NAPROSYN) 500 MG tablet Take 1 tablet (500 mg total) by mouth 2 (two) times daily with a meal. 10/27/11  Yes Adlih Moreno-Coll, MD  omeprazole (PRILOSEC) 10 MG capsule Take 10 mg by mouth daily.   Yes Historical Provider, MD  azithromycin (ZITHROMAX Z-PAK) 250 MG tablet Use as directed 03/03/14   Liam Graham, PA-C  chlorpheniramine-HYDROcodone (TUSSIONEX PENNKINETIC ER) 10-8 MG/5ML LQCR Take 5 mLs by mouth every 12 (twelve) hours as needed for cough. 03/03/14   Liam Graham, PA-C  cyclobenzaprine (FLEXERIL) 10 MG tablet Take 1 tablet (10 mg total) by mouth 2 (two) times daily as needed for muscle spasms. 10/27/11   Adlih Moreno-Coll, MD  HYDROcodone-acetaminophen (VICODIN) 5-500 MG per tablet Take 1 tablet by mouth every 8 (eight) hours as needed for pain. 10/27/11   Adlih Moreno-Coll, MD  predniSONE (DELTASONE) 2.5 MG tablet Take 2.5 mg by mouth daily.    Historical Provider, MD   BP 125/85 mmHg  Pulse 78  Temp(Src) 97.8 F (36.6 C) (Oral)  Resp 16  SpO2 99% Physical Exam  Constitutional: She is oriented to person, place, and time. Vital signs are normal. She appears well-developed and well-nourished. No distress.  HENT:  Head: Normocephalic and atraumatic.  Right Ear: External ear normal.  Left Ear: External ear normal.  Nose: Nose normal.  Mouth/Throat:  Oropharynx is clear and moist. No oropharyngeal exudate.  Eyes: Conjunctivae are normal. Right eye exhibits no discharge. Left eye exhibits no discharge.  Neck: Normal range of motion. Neck supple.  Cardiovascular: Normal rate, regular rhythm and normal heart sounds.   Pulmonary/Chest: Effort normal and breath sounds normal. No respiratory distress.  Lymphadenopathy:    She has no cervical adenopathy.  Neurological: She is alert and oriented to person, place, and time. She  has normal strength. Coordination normal.  Skin: Skin is warm and dry. No rash noted. She is not diaphoretic.  Psychiatric: She has a normal mood and affect. Judgment normal.  Nursing note and vitals reviewed.   ED Course  Procedures (including critical care time) Labs Review Labs Reviewed - No data to display  Imaging Review No results found.   MDM   1. Acute bronchitis, unspecified organism    Physical exam is entirely normal. Most likely viral bronchitis. Treat with Tussionex cough syrup, over-the-counter medications. I will give her a prescription for azithromycin to take if she starts to get worse, but advised her to not take this for the cough, it will not help. 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 She may follow-up as needed.   New Prescriptions   AZITHROMYCIN (ZITHROMAX Z-PAK) 250 MG TABLET    Use as directed   CHLORPHENIRAMINE-HYDROCODONE (TUSSIONEX PENNKINETIC ER) 10-8 MG/5ML LQCR    Take 5 mLs by mouth every 12 (twelve) hours as needed for cough.      Liam Graham, PA-C 03/03/14 1045

## 2014-03-03 NOTE — ED Notes (Signed)
C/o cough, congestion since 2-5. C/o unable to sleep last night

## 2014-12-31 ENCOUNTER — Telehealth: Payer: Self-pay | Admitting: Family

## 2014-12-31 DIAGNOSIS — J0120 Acute ethmoidal sinusitis, unspecified: Secondary | ICD-10-CM

## 2014-12-31 MED ORDER — AMOXICILLIN-POT CLAVULANATE 875-125 MG PO TABS: 1.0000 | ORAL_TABLET | Freq: Two times a day (BID) | ORAL | Status: AC

## 2014-12-31 NOTE — Progress Notes (Signed)
We are sorry that you are not feeling well.  Here is how we plan to help!  *To answer your question, it has been going on long enough that it would fall into the bacterial category, not viral. That being said, see treatment plan below.   Based on what you have shared with me it looks like you have sinusitis.  Sinusitis is inflammation and infection in the sinus cavities of the head.  Based on your presentation I believe you most likely have Acute Bacterial Sinusitis.  This is an infection caused by bacteria and is treated with antibiotics. I have prescribed Augmentin, an antibiotic in the penicillin family, one tablet twice daily with food, for 7 days. You may use an oral decongestant such as Mucinex D or if you have glaucoma or high blood pressure use plain Mucinex. Saline nasal spray help and can safely be used as often as needed for congestion.  If you develop worsening sinus pain, fever or notice severe headache and vision changes, or if symptoms are not better after completion of antibiotic, please schedule an appointment with a health care provider.    Sinus infections are not as easily transmitted as other respiratory infection, however we still recommend that you avoid close contact with loved ones, especially the very young and elderly.  Remember to wash your hands thoroughly throughout the day as this is the number one way to prevent the spread of infection!  Home Care:  Only take medications as instructed by your medical team.  Complete the entire course of an antibiotic.  Do not take these medications with alcohol.  A steam or ultrasonic humidifier can help congestion.  You can place a towel over your head and breathe in the steam from hot water coming from a faucet.  Avoid close contacts especially the very young and the elderly.  Cover your mouth when you cough or sneeze.  Always remember to wash your hands.  Get Help Right Away If:  You develop worsening fever or sinus  pain.  You develop a severe head ache or visual changes.  Your symptoms persist after you have completed your treatment plan.  Make sure you  Understand these instructions.  Will watch your condition.  Will get help right away if you are not doing well or get worse.  Your e-visit answers were reviewed by a board certified advanced clinical practitioner to complete your personal care plan.  Depending on the condition, your plan could have included both over the counter or prescription medications.  If there is a problem please reply  once you have received a response from your provider.  Your safety is important to [place].  If you have drug allergies check your prescription carefully.    You can use MyChart to ask questions about today's visit, request a non-urgent call back, or ask for a work or school excuse for 24 hours related to this e-Visit. If it has been greater than 24 hours you will need to follow up with your provider, or enter a new e-Visit to address those concerns.  You will get an e-mail in the next two days asking about your experience.  I hope that your e-visit has been valuable and will speed your recovery. Thank you for using e-visits.

## 2015-01-04 ENCOUNTER — Other Ambulatory Visit: Payer: Self-pay

## 2015-01-04 DIAGNOSIS — Z1231 Encounter for screening mammogram for malignant neoplasm of breast: Secondary | ICD-10-CM

## 2015-02-10 ENCOUNTER — Ambulatory Visit
Admission: RE | Admit: 2015-02-10 | Discharge: 2015-02-10 | Disposition: A | Discharge status: Home / Self Care | Source: Ambulatory Visit

## 2015-02-10 DIAGNOSIS — Z1231 Encounter for screening mammogram for malignant neoplasm of breast: Secondary | ICD-10-CM

## 2015-06-24 ENCOUNTER — Encounter: Payer: Self-pay | Admitting: Podiatry

## 2015-06-24 ENCOUNTER — Ambulatory Visit (INDEPENDENT_AMBULATORY_CARE_PROVIDER_SITE_OTHER)

## 2015-06-24 ENCOUNTER — Ambulatory Visit (INDEPENDENT_AMBULATORY_CARE_PROVIDER_SITE_OTHER): Admitting: Podiatry

## 2015-06-24 VITALS — Ht 70.0 in | Wt 211.0 lb

## 2015-06-24 DIAGNOSIS — M2041 Other hammer toe(s) (acquired), right foot: Secondary | ICD-10-CM

## 2015-06-24 DIAGNOSIS — M79671 Pain in right foot: Secondary | ICD-10-CM

## 2015-06-24 DIAGNOSIS — M779 Enthesopathy, unspecified: Secondary | ICD-10-CM

## 2015-06-24 MED ORDER — TRIAMCINOLONE ACETONIDE 10 MG/ML IJ SUSP
10.0000 mg | Freq: Once | INTRAMUSCULAR | Status: AC
  Administered 2015-06-24: 10 mg

## 2015-06-24 NOTE — Progress Notes (Signed)
   Subjective:    Patient ID: Kendra Norman, female    DOB: 02/27/1955, 60 y.o.   MRN: [id number]  HPI Chief Complaint  Patient presents with  . Foot Pain    Right foot; plantar forefoot; pt stated, "Feels like has Morton's Neuroma; Toes 2&3 hurt and feels like a pinching feeling in toes"; x5-6 weeks      Review of Systems  HENT: Positive for nosebleeds.   All other systems reviewed and are negative.      Objective:   Physical Exam        Assessment & Plan:

## 2015-06-27 NOTE — Progress Notes (Signed)
Subjective:     Patient ID: Kendra Norman, female   DOB: April 24, 1955, 60 y.o.   MRN: [id number]  HPI patient presents stating she's getting a lot of pain under her forefoot right and it's been going on now for several months and is worse when she stands on it or walks   Review of Systems  All other systems reviewed and are negative.      Objective:   Physical Exam  Constitutional: She is oriented to person, place, and time.  Cardiovascular: Intact distal pulses.   Musculoskeletal: Normal range of motion.  Neurological: She is oriented to person, place, and time.  Skin: Skin is warm.  Nursing note and vitals reviewed.  neurovascular status intact muscle strength adequate range of motion within normal limits with patient found to have exquisite irritation right second MPJ with fluid buildup around the joint and pain when palpated     Assessment:     Inflammatory capsulitis second MPJ right with fluid buildup    Plan:     H&P condition reviewed and at this point recommended capsular injection and explained risk to patient. I did a proximal nerve block of the area I then aspirated the second MPJ getting out of small amount of clear fluid and injected with half cc of dexamethasone with a small amount of Kenalog and applied thick padding. Instructed on not going nonweightbearing and wearing supportive shoes  X-ray report was negative for signs of fracture with mild deviation of the second digit noted

## 2015-06-30 ENCOUNTER — Ambulatory Visit (INDEPENDENT_AMBULATORY_CARE_PROVIDER_SITE_OTHER): Admitting: Podiatry

## 2015-06-30 DIAGNOSIS — M779 Enthesopathy, unspecified: Secondary | ICD-10-CM | POA: Diagnosis not present

## 2015-06-30 DIAGNOSIS — M79671 Pain in right foot: Secondary | ICD-10-CM

## 2015-06-30 DIAGNOSIS — M2041 Other hammer toe(s) (acquired), right foot: Secondary | ICD-10-CM | POA: Diagnosis not present

## 2015-06-30 NOTE — Progress Notes (Signed)
Subjective:     Patient ID: Kendra Norman, female   DOB: [birth year], 60 y.o.   MRN: [id number]  HPI patient presents stating I'm improved but still hurting if I'm active or playing golf   Review of Systems     Objective:   Physical Exam Neurovascular status intact muscle strength adequate with continued discomfort in the second metatarsophalangeal joint right with inflammation fluid buildup upon deep palpation. Patient is noted to have mild elevation of the digit    Assessment:     Inflammatory capsulitis right second MPJ improved but still present    Plan:     H&P conditions reviewed and went ahead today and scanned for custom orthotics to reduce stress on the joint surface. Dispensed metatarsal pads with instructions on usage and reappoint when orthotics are ready

## 2015-07-27 ENCOUNTER — Ambulatory Visit (INDEPENDENT_AMBULATORY_CARE_PROVIDER_SITE_OTHER): Admitting: Podiatry

## 2015-07-27 DIAGNOSIS — M79671 Pain in right foot: Secondary | ICD-10-CM

## 2015-07-27 DIAGNOSIS — M779 Enthesopathy, unspecified: Secondary | ICD-10-CM

## 2015-07-27 DIAGNOSIS — M2041 Other hammer toe(s) (acquired), right foot: Secondary | ICD-10-CM

## 2015-07-27 NOTE — Progress Notes (Signed)
   Subjective:    Patient ID: Kendra Norman, female    DOB: 09-28-1955, 60 y.o.   MRN: [id number]  HPI "It still hurts but I want to see how the orthotics do before I see Dr. Paulla Dolly again."  Patient presents to pick up orthotics.  Wearing instructions were given.    Review of Systems     Objective:   Physical Exam        Assessment & Plan:

## 2015-07-27 NOTE — Patient Instructions (Signed)

## 2015-08-24 ENCOUNTER — Ambulatory Visit: Admitting: Podiatry

## 2015-09-07 ENCOUNTER — Ambulatory Visit (INDEPENDENT_AMBULATORY_CARE_PROVIDER_SITE_OTHER)

## 2015-09-07 ENCOUNTER — Ambulatory Visit (INDEPENDENT_AMBULATORY_CARE_PROVIDER_SITE_OTHER): Admitting: Podiatry

## 2015-09-07 ENCOUNTER — Encounter: Payer: Self-pay | Admitting: Podiatry

## 2015-09-07 VITALS — BP 135/81 | HR 67 | Resp 16

## 2015-09-07 DIAGNOSIS — M779 Enthesopathy, unspecified: Secondary | ICD-10-CM

## 2015-09-07 DIAGNOSIS — M2041 Other hammer toe(s) (acquired), right foot: Secondary | ICD-10-CM | POA: Diagnosis not present

## 2015-09-07 MED ORDER — TRIAMCINOLONE ACETONIDE 10 MG/ML IJ SUSP
10.0000 mg | Freq: Once | INTRAMUSCULAR | Status: AC
  Administered 2015-09-07: 10 mg

## 2015-09-07 NOTE — Progress Notes (Signed)
Subjective:     Patient ID: Kendra Norman, female   DOB: 12/07/1955, 60 y.o.   MRN: [id number]  HPI patient presents stating that her foot continues to be sore and it's making it difficult for her to walk comfortably   Review of Systems     Objective:   Physical Exam  Neurovascular status intact muscle strength adequate range of motion within normal limits with patient still having inflammatory changes around the second metatarsophalangeal joint with fluid buildup that is making it very hard for her to be comfortable. States that it has not gotten better with previous treatments    Assessment:     Inflammatory capsulitis that so far has not responded to medication and to orthotic therapy    Plan:     Reviewed condition at great length and patient would like to have correction at one point but needs something temporary as she has several trips planned. I've discussed surgery and I recommended shortening osteotomy along with flushing of the joint and I explained procedure and she wants to get this done in future border to try to hold off and give her temporary relief. I did a proximal nerve block right I then aspirated the joint getting out a small amount of clear fluid and injected with quarter cc dexamethasone Kenalog and applied air fracture walker to completely immobilize. Reappoint for [place] to recheck again in October with surgery tenably scheduled for November  X-ray report indicates that the bone is in good structural position but slightly long gaited with what appears to be distention of the joint

## 2015-09-08 ENCOUNTER — Telehealth: Payer: Self-pay | Admitting: *Deleted

## 2015-09-08 NOTE — Telephone Encounter (Signed)
Pt states saw Dr. Paulla Dolly yesterday for capsulitis, and was put in a boot for 10 -14 days then to go back in to  Shoes with orthotics,  Pt asked was she to wear the boot until she came back for appt. Dr. Paulla Dolly states stay in boot for 2 weeks then begin to wear orthotics in athletic shoes as long as comfortable, once uncomfortable go back in the boot, until she is wear athletic shoes with orthotics all day. Informed pt.

## 2015-10-19 ENCOUNTER — Ambulatory Visit (INDEPENDENT_AMBULATORY_CARE_PROVIDER_SITE_OTHER): Admitting: Podiatry

## 2015-10-19 DIAGNOSIS — M779 Enthesopathy, unspecified: Secondary | ICD-10-CM | POA: Diagnosis not present

## 2015-10-19 DIAGNOSIS — M79671 Pain in right foot: Secondary | ICD-10-CM

## 2015-10-19 NOTE — Patient Instructions (Signed)
Pre-Operative Instructions  Congratulations, you have decided to take an important step to improving your quality of life.  You can be assured that the doctors of [facility] will be with you every step of the way.  1. Plan to be at the surgery center/hospital at least 1 (one) hour prior to your scheduled time unless otherwise directed by the surgical center/hospital staff.  You must have a responsible adult accompany you, remain during the surgery and drive you home.  Make sure you have directions to the surgical center/hospital and know how to get there on time. 2. For hospital based surgery you will need to obtain a history and physical form from your family physician within 1 month prior to the date of surgery- we will give you a form for you primary physician.  3. We make every effort to accommodate the date you request for surgery.  There are however, times where surgery dates or times have to be moved.  We will contact you as soon as possible if a change in schedule is required.   4. No Aspirin/Ibuprofen for one week before surgery.  If you are on aspirin, any non-steroidal anti-inflammatory medications (Mobic, Aleve, Ibuprofen) you should stop taking it 7 days prior to your surgery.  You make take Tylenol  For pain prior to surgery.  5. Medications- If you are taking daily heart and blood pressure medications, seizure, reflux, allergy, asthma, anxiety, pain or diabetes medications, make sure the surgery center/hospital is aware before the day of surgery so they may notify you which medications to take or avoid the day of surgery. 6. No food or drink after midnight the night before surgery unless directed otherwise by surgical center/hospital staff. 7. No alcoholic beverages 24 hours prior to surgery.  No smoking 24 hours prior to or 24 hours after surgery. 8. Wear loose pants or shorts- loose enough to fit over bandages, boots, and casts. 9. No slip on shoes, sneakers are best. 10. Bring  your boot with you to the surgery center/hospital.  Also bring crutches or a walker if your physician has prescribed it for you.  If you do not have this equipment, it will be provided for you after surgery. 11. If you have not been contracted by the surgery center/hospital by the day before your surgery, call to confirm the date and time of your surgery. 12. Leave-time from work may vary depending on the type of surgery you have.  Appropriate arrangements should be made prior to surgery with your employer. 13. Prescriptions will be provided immediately following surgery by your doctor.  Have these filled as soon as possible after surgery and take the medication as directed. 14. Remove nail polish on the operative foot. 15. Wash the night before surgery.  The night before surgery wash the foot and leg well with the antibacterial soap provided and water paying special attention to beneath the toenails and in between the toes.  Rinse thoroughly with water and dry well with a towel.  Perform this wash unless told not to do so by your physician.  Enclosed: 1 Ice pack (please put in freezer the night before surgery)   1 Hibiclens skin cleaner   Pre-op Instructions  If you have any questions regarding the instructions, do not hesitate to call our office.  [place]: [address] [place], [place] [address] [phone]  [place]: [address] [place], [place] [address] [phone]  [place]: [address]  [place], [place] [address] [phone]   Dr.   Norman Regal DPM, Dr. Matthew Wagoner DPM, Dr. M. Todd Hyatt DPM, Dr. Titorya Stover DPM 

## 2015-10-20 NOTE — Progress Notes (Signed)
Subjective:     Patient ID: Kendra Norman, female   DOB: April 04, 1955, 60 y.o.   MRN: [id number]  HPI patient complains of continued discomfort in the right second MPJ and states that immobilization and padding has not been successful along with the previous injections   Review of Systems     Objective:   Physical Exam Neurovascular status intact muscle strength adequate with inflammatory changes second MPJ right which we may remain quite discomforting despite numerous conservative treatments    Assessment:     Chronic capsulitis second MPJ right    Plan:     Discussed condition at great length and failure so far to respond to conservative treatment. I have recommended at this time consideration of shortening osteotomy to decompress the joint and patient wants this done due to long-term pain and I allowed her to read consent form reviewing alternative treatments and complications. She understands there is no guarantee as far as success of procedure and that total recovery can take 6 months to one year and she signs consent form after extensive review. She was encouraged to call with questions and is scheduled for after she returns from her vacation

## 2015-11-30 ENCOUNTER — Telehealth: Payer: Self-pay | Admitting: *Deleted

## 2015-11-30 NOTE — Telephone Encounter (Signed)
"  Dr. Paulla Dolly is doing surgery on me December 14.  When I talked to [facility] they told me you guys would be the ones to tell me which medications to take.  Is it okay for me to continue taking the baby Aspirin and Naprosyn?  Please give me a call."

## 2015-12-01 NOTE — Telephone Encounter (Signed)
Stop the baby aspirin 3 days before and naprosyn couple days before. Has to be careful about taking both medications together for long periods of time

## 2015-12-02 NOTE — Telephone Encounter (Signed)
I called and left patient a message of Dr. Mellody Drown response.

## 2015-12-05 ENCOUNTER — Telehealth: Payer: Self-pay | Admitting: *Deleted

## 2015-12-05 NOTE — Telephone Encounter (Signed)
I received your call on Friday about taking the Naprosyn and my Aspirin.  By time I got your message on Friday it was too late to call. I had another question.  I have not received anything about my insurance, whether or not it will cover my surgery.  I don't think it needs to be authorized but not sure.  I don't want to be surprised and asked for $500 or something like that."  I have it right here in front of me to check.  [employer] does not normally have to be authorized.  "Right, I think because the surgery is done outpatient.  Thank you so much."

## 2015-12-06 ENCOUNTER — Encounter: Payer: Self-pay | Admitting: Podiatry

## 2015-12-06 DIAGNOSIS — M21541 Acquired clubfoot, right foot: Secondary | ICD-10-CM | POA: Diagnosis not present

## 2015-12-14 ENCOUNTER — Ambulatory Visit (INDEPENDENT_AMBULATORY_CARE_PROVIDER_SITE_OTHER)

## 2015-12-14 ENCOUNTER — Ambulatory Visit (INDEPENDENT_AMBULATORY_CARE_PROVIDER_SITE_OTHER): Admitting: Podiatry

## 2015-12-14 DIAGNOSIS — M779 Enthesopathy, unspecified: Secondary | ICD-10-CM | POA: Diagnosis not present

## 2015-12-14 DIAGNOSIS — Z9889 Other specified postprocedural states: Secondary | ICD-10-CM

## 2015-12-14 NOTE — Progress Notes (Signed)
Subjective:     Patient ID: Kendra Norman, female   DOB: January 12, 1956, 60 y.o.   MRN: [id number]  HPI patient states she's doing real well with her right foot with minimal discomfort   Review of Systems     Objective:   Physical Exam Neurovascular status intact with patient noted to have well-healed surgical site right second metatarsal negative Homans sign noted and moderate elevation of the second digit    Assessment:     Doing well overall osteotomy right second metatarsal with moderate lifting of the toe    Plan:     Applied sterile dressing instructed on elevation and dispensed a BioSkin ankle brace to lower the second digit with instructions on usage. I then went ahead and dispensed surgical shoe and advised on continued elevation compression  X-ray indicates excellent healing second metatarsal right with screw in place no signs of movement and good alignment with elevation of the toe noted

## 2015-12-20 ENCOUNTER — Telehealth: Payer: Self-pay | Admitting: *Deleted

## 2015-12-20 NOTE — Telephone Encounter (Signed)
Pt asked if when she was to wear the Bioskin and when she was to wear the ace wrap. I spoke with Gretta Arab, RN and she states Dr. Paulla Dolly has pt wear the Bioskin at night and the ace wrap during the day. I informed the pt,pt states understanding.

## 2015-12-30 ENCOUNTER — Encounter: Payer: Self-pay | Admitting: Podiatry

## 2016-01-04 ENCOUNTER — Ambulatory Visit (INDEPENDENT_AMBULATORY_CARE_PROVIDER_SITE_OTHER): Admitting: Podiatry

## 2016-01-04 ENCOUNTER — Encounter: Payer: Self-pay | Admitting: Podiatry

## 2016-01-04 ENCOUNTER — Ambulatory Visit (INDEPENDENT_AMBULATORY_CARE_PROVIDER_SITE_OTHER)

## 2016-01-04 VITALS — BP 107/72 | HR 71 | Resp 16

## 2016-01-04 DIAGNOSIS — Z9889 Other specified postprocedural states: Secondary | ICD-10-CM

## 2016-01-04 DIAGNOSIS — M7751 Other enthesopathy of right foot: Secondary | ICD-10-CM | POA: Diagnosis not present

## 2016-01-04 NOTE — Progress Notes (Signed)
Subjective:     Patient ID: Kendra Norman, female   DOB: 1955-05-02, 60 y.o.   MRN: [id number]  HPI patient states the right foot is doing well with mild swelling   Review of Systems     Objective:   Physical Exam Neurovascular status intact muscle strength adequate with patient's right foot doing well with the second toe slightly elevated with minimal discomfort and swelling of the second MPJ    Assessment:     Doing well post osteotomy second metatarsal right with mild elevation of the toe    Plan:     Instructed on continuing to pull the second toe down reviewed x-rays with patient and applied anklet to provide compression. Explained elevation and reappoint  X-ray report indicate screws in place with no indications of movement with good alignment of the underlying metatarsal

## 2016-01-09 NOTE — Progress Notes (Signed)
DOS 11.14.2017 Shortening Osteotomy with Screw Fixation 2nd Metatarsal Right

## 2016-02-17 ENCOUNTER — Ambulatory Visit (INDEPENDENT_AMBULATORY_CARE_PROVIDER_SITE_OTHER)

## 2016-02-17 ENCOUNTER — Ambulatory Visit (INDEPENDENT_AMBULATORY_CARE_PROVIDER_SITE_OTHER): Payer: Self-pay | Admitting: Podiatry

## 2016-02-17 DIAGNOSIS — Z9889 Other specified postprocedural states: Secondary | ICD-10-CM

## 2016-02-18 NOTE — Progress Notes (Signed)
Subjective:     Patient ID: Margretta Ditty, female   DOB: 03-28-1955, 61 y.o.   MRN: [id number]  HPI patient states she's doing real well with the right foot and having minimal discomfort   Review of Systems     Objective:   Physical Exam Neurovascular status intact muscle strength adequate with patient noted to have well-healing surgical sites right foot with wound edges well coapted and minimal edema and no pain    Assessment:     Doing well with foot surgery right    Plan:     Final x-ray reviewed and allow patient to return to normal activity and reappoint as needed  X-ray indicates screw was in place no indications of movement with good alignment

## 2016-02-21 ENCOUNTER — Other Ambulatory Visit: Payer: Self-pay | Admitting: Obstetrics and Gynecology

## 2016-02-21 DIAGNOSIS — Z1231 Encounter for screening mammogram for malignant neoplasm of breast: Secondary | ICD-10-CM

## 2016-03-12 ENCOUNTER — Ambulatory Visit: Payer: PRIVATE HEALTH INSURANCE

## 2016-03-14 ENCOUNTER — Ambulatory Visit
Admission: RE | Admit: 2016-03-14 | Discharge: 2016-03-14 | Disposition: A | Discharge status: Home / Self Care | Payer: PRIVATE HEALTH INSURANCE | Source: Ambulatory Visit | Attending: Obstetrics and Gynecology | Admitting: Obstetrics and Gynecology

## 2016-03-14 DIAGNOSIS — Z1231 Encounter for screening mammogram for malignant neoplasm of breast: Secondary | ICD-10-CM

## 2016-06-06 ENCOUNTER — Telehealth: Admitting: Family

## 2016-06-06 DIAGNOSIS — L255 Unspecified contact dermatitis due to plants, except food: Secondary | ICD-10-CM

## 2016-06-06 MED ORDER — PREDNISONE 10 MG (21) PO TBPK: ORAL_TABLET | ORAL | 0 refills | Status: DC

## 2016-06-06 NOTE — Progress Notes (Signed)
E Visit for Rash  We are sorry that you are not feeling well. Here is how we plan to help!  Based on what you shared with me it looks like you have contact dermatitis.  Contact dermatitis is a skin rash caused by something that touches the skin and causes irritation or inflammation.  Your skin may be red, swollen, dry, cracked, and itch.  The rash should go away in a few days but can last a few weeks.  If you get a rash, it's important to figure out what caused it so the irritant can be avoided in the future.  Sterapred 10 mg dose pak    HOME CARE:   Take cool showers and avoid direct sunlight.  Apply cool compress or wet dressings.  Take a bath in an oatmeal bath.  Sprinkle content of one Aveeno packet under running faucet with comfortably warm water.  Bathe for 15-20 minutes, 1-2 times daily.  Pat dry with a towel. Do not rub the rash.  Use hydrocortisone cream.  Take an antihistamine like Benadryl for widespread rashes that itch.  The adult dose of Benadryl is 25-50 mg by mouth 4 times daily.  Caution:  This type of medication may cause sleepiness.  Do not drink alcohol, drive, or operate dangerous machinery while taking antihistamines.  Do not take these medications if you have prostate enlargement.  Read package instructions thoroughly on all medications that you take.  GET HELP RIGHT AWAY IF:   Symptoms don't go away after treatment.  Severe itching that persists.  If you rash spreads or swells.  If you rash begins to smell.  If it blisters and opens or develops a yellow-brown crust.  You develop a fever.  You have a sore throat.  You become short of breath.  MAKE SURE YOU:  Understand these instructions. Will watch your condition. Will get help right away if you are not doing well or get worse.  Thank you for choosing an e-visit. Your e-visit answers were reviewed by a board certified advanced clinical practitioner to complete your personal care plan.  Depending upon the condition, your plan could have included both over the counter or prescription medications. Please review your pharmacy choice. Be sure that the pharmacy you have chosen is open so that you can pick up your prescription now.  If there is a problem you may message your provider in [place] to have the prescription routed to another pharmacy. Your safety is important to [place]. If you have drug allergies check your prescription carefully.  For the next 24 hours, you can use MyChart to ask questions about today's visit, request a non-urgent call back, or ask for a work or school excuse from your e-visit provider. You will get an email in the next two days asking about your experience. I hope that your e-visit has been valuable and will speed your recovery.

## 2017-02-04 ENCOUNTER — Other Ambulatory Visit: Payer: Self-pay | Admitting: Obstetrics and Gynecology

## 2017-02-04 DIAGNOSIS — Z1231 Encounter for screening mammogram for malignant neoplasm of breast: Secondary | ICD-10-CM

## 2017-03-18 ENCOUNTER — Ambulatory Visit
Admission: RE | Admit: 2017-03-18 | Discharge: 2017-03-18 | Disposition: A | Discharge status: Home / Self Care | Payer: PRIVATE HEALTH INSURANCE | Source: Ambulatory Visit | Attending: Obstetrics and Gynecology | Admitting: Obstetrics and Gynecology

## 2017-03-18 DIAGNOSIS — Z1231 Encounter for screening mammogram for malignant neoplasm of breast: Secondary | ICD-10-CM

## 2018-01-22 ENCOUNTER — Telehealth: Payer: PRIVATE HEALTH INSURANCE | Admitting: Family

## 2018-01-22 DIAGNOSIS — J019 Acute sinusitis, unspecified: Secondary | ICD-10-CM

## 2018-01-22 MED ORDER — AMOXICILLIN-POT CLAVULANATE 875-125 MG PO TABS: 1.0000 | ORAL_TABLET | Freq: Two times a day (BID) | ORAL | 0 refills | Status: DC

## 2018-01-22 MED ORDER — AMOXICILLIN-POT CLAVULANATE 875-125 MG PO TABS
1.0000 | ORAL_TABLET | Freq: Two times a day (BID) | ORAL | 0 refills | Status: DC
Start: 2018-01-22 — End: 2018-01-22

## 2018-01-22 NOTE — Addendum Note (Signed)
Addended by: Evelina Dun A on: 01/22/2018 03:01 PM   Modules accepted: Orders

## 2018-01-22 NOTE — Progress Notes (Signed)

## 2018-02-28 ENCOUNTER — Other Ambulatory Visit: Payer: Self-pay | Admitting: Obstetrics and Gynecology

## 2018-02-28 DIAGNOSIS — Z1231 Encounter for screening mammogram for malignant neoplasm of breast: Secondary | ICD-10-CM

## 2018-04-02 ENCOUNTER — Other Ambulatory Visit: Payer: Self-pay

## 2018-04-02 ENCOUNTER — Ambulatory Visit
Admission: RE | Admit: 2018-04-02 | Discharge: 2018-04-02 | Disposition: A | Discharge status: Home / Self Care | Source: Ambulatory Visit | Attending: Obstetrics and Gynecology | Admitting: Obstetrics and Gynecology

## 2018-04-02 DIAGNOSIS — Z1231 Encounter for screening mammogram for malignant neoplasm of breast: Secondary | ICD-10-CM

## 2018-10-16 ENCOUNTER — Other Ambulatory Visit: Payer: Self-pay

## 2018-10-16 ENCOUNTER — Ambulatory Visit (INDEPENDENT_AMBULATORY_CARE_PROVIDER_SITE_OTHER): Payer: Medicare Other | Admitting: Sports Medicine

## 2018-10-16 VITALS — BP 124/80 | Ht 70.0 in | Wt 216.0 lb

## 2018-10-16 DIAGNOSIS — S39012A Strain of muscle, fascia and tendon of lower back, initial encounter: Secondary | ICD-10-CM | POA: Diagnosis not present

## 2018-10-16 NOTE — Assessment & Plan Note (Addendum)
History and exam consistent with lumbar muscular strain with some components and history suggestive of radicular pathology.  In acute setting, conservative approach to management was decided on with discussion with patient on options. Hip pathology was ruled out with an equivocal hip exam.  With a history of breast cancer, suspicion for malignancy contribution is high. If patient is not improved in short time with conservative measures, will have low threshold to move forward with diagnostic imaging of lumbar/pelvic region. - refer to physical therapy - continue activity as tolerated - may use ice and/or heat as comfortable - continue to use NSAIDs modestly for pain - return in about 4 weeks to discuss progression

## 2018-10-16 NOTE — Progress Notes (Signed)
PCP: [facility]  Subjective:   CC: Patient is a 63 y.o. female here for right lower back pain.  HPI: Kendra Norman is a 63 year old female here for new onset right lower back pain.  Approximately a month ago patient was swinging a golf club for a long drive and noticed a popping sensation in her back.  She has this constant aching in that portion which has intermediate changes of severity and reminds her of her previous back problems years ago.  Patient also describes this new sensation as a burning of her hip and thigh.  Some days patient can walk long distances without any aggravation and other days she cannot even walk a quarter of a mile without aggravating pain.  Surprisingly, swinging a golf club has no effect on the pain at this time.  Patient states that pain is worsened by walking and standing for prolonged periods of time.  Patient denies any radiation down her legs, weakness, numbness or tingling, loss of control of bowel or bladder.  Patient was prescribed a steroid pack from PCP which had some initial improvement but has not resolved her symptoms.  ROS: See HPI above. I have personally reviewed pertinent past medical history, surgical, family, and social history as appropriate.     Objective:  BP 124/80   Ht 5\' 10"  (1.778 m)   Wt 216 lb (98 kg)   BMI 30.99 kg/m   Physical Exam: Gen: NAD, comfortable in exam room  Right hip: Observation: Negative for erythema, edema, deformity Palpation: Negative for tenderness along greater trochanter and IT band ROM: Full flexion, extension, abduction Strength: Full in flexion, extension, abduction and abduction.  Patient endorses tenderness to gluteus area with resisted abduction Sensation: Intact Special tests: Negative logroll, Corky Sox, Fadir  Lumbosacral spine: Observation: Negative for erythema, edema, deformity Palpation:Palpation of lumbosacral spine negative for tenderness ROM: Patient had painless  flexion, extension, side bending, rotation of lumbar spine. Special tests: Negative straight leg raise test bilaterally   Assessment & Plan:  Low back pain likely secondary to lumbar strain  Patient's symptoms are similar to what she experienced in 2013.  She will start formal physical therapy and will follow-up in 4 weeks.  Consider imaging if no improvement.  Doristine Mango, [facility] PGY-2  Patient seen and evaluated with the resident.  I agree with the above plan of care.  Patient's symptoms are similar to the pain she experienced in 2013.  She will start physical therapy and will follow-up with me in 4 weeks.  If symptoms persist or worsen then I would consider merits of updated imaging of her lumbar spine.  Call with questions or concerns in the interim.

## 2018-10-22 ENCOUNTER — Other Ambulatory Visit: Payer: Self-pay

## 2018-10-22 ENCOUNTER — Ambulatory Visit: Payer: Medicare Other | Attending: Sports Medicine

## 2018-10-22 DIAGNOSIS — M545 Low back pain, unspecified: Secondary | ICD-10-CM

## 2018-10-22 DIAGNOSIS — R252 Cramp and spasm: Secondary | ICD-10-CM | POA: Insufficient documentation

## 2018-10-22 NOTE — Patient Instructions (Signed)
Perform all exercises below:  Hold _20___ seconds. Repeat _3___ times.  Do __3__ sessions per day. CAUTION: Movement should be gentle, steady and slow.  Knee to Chest  Lying supine, bend involved knee to chest. Perform with each leg.    Lumbar Rotation: Caudal - Bilateral (Supine)  Feet and knees together, arms outstretched, rotate knees left, turning head in opposite direction, until stretch is felt.      HIP: Hamstrings - Short Sitting   Rest leg on raised surface. Keep knee straight. Lift chest.  Piriformis Stretch, Sitting       Copyright  VHI. All rights reserved.    [facility] [address], [place], [place] [address] Phone # [phone] Fax [phone]

## 2018-10-22 NOTE — Therapy (Signed)
[facility] [address] [place], [place], [address] Phone: [phone]   Fax:  [phone]  Physical Therapy Evaluation  Patient Details  Name: Kendra Norman MRN: [id number] Date of Birth: 22-Mar-1955 Referring Provider (PT): Lilia Argue, MD   Encounter Date: 10/22/2018  PT End of Session - 10/22/18 1008    Visit Number  1    Date for PT Re-Evaluation  12/17/18    Authorization Type  Medicare/Tricare    PT Start Time  0931    PT Stop Time  1009    PT Time Calculation (min)  38 min    Activity Tolerance  Patient tolerated treatment well    Behavior During Therapy  [facility] for tasks assessed/performed       Past Medical History:  Diagnosis Date  . Breast cancer ([place])   . GERD (gastroesophageal reflux disease)   . Seasonal allergies     Past Surgical History:  Procedure Laterality Date  . ABDOMINAL HYSTERECTOMY    . BREAST BIOPSY    . BREAST EXCISIONAL BIOPSY Bilateral   . VEIN SURGERY      There were no vitals filed for this visit.   Subjective Assessment - 10/22/18 0936    Subjective  Pt reports to PT with LBP that began after hitting a golf club and jarred the club on the ground.  Pt had pain at that time and continued to play that round. Pt took oral steroids and this helped the pain during that time and took a break from playing golf.   Pt is now experiencing LBP and Lt gluteal pain with walking. Pt played golf for the first time yesterday and experienced LBP and Lt gluteal pain that radiates to the anterior thigh.    How long can you stand comfortably?  no limits now    How long can you walk comfortably?  no limits now    Diagnostic tests  none    Patient Stated Goals  reduce LBP, play golf without LBP after, walk without pain.    Currently in Pain?  Yes    Pain Score  0-No pain   up to 7/10 with exaccerbated   Pain Location  Back    Pain Orientation  Left    Pain Descriptors / Indicators   Stabbing;Aching;Sore    Pain Type  Chronic pain    Pain Radiating Towards  Lt buttock, lateral and anterior thigh    Pain Onset  More than a month ago    Pain Frequency  Intermittent    Aggravating Factors   after playing golf, standing in the kitchen to cook, walking long periods- this varies    Pain Relieving Factors  heat/ice, Naproxen, Biofreeze, sitting down         [facility] PT Assessment - 10/22/18 0001      Assessment   Medical Diagnosis  strain of lumbar region    Referring Provider (PT)  Lilia Argue, MD    Onset Date/Surgical Date  09/21/18    Next MD Visit  10/2018    Prior Therapy  none      Precautions   Precautions  None      Restrictions   Weight Bearing Restrictions  No      Balance Screen   Has the patient fallen in the past 6 months  No    Has the patient had a decrease in activity level because of a fear of falling?   No  Is the patient reluctant to leave their home because of a fear of falling?   No      Home Film/video editor residence    Living Arrangements  Spouse/significant other    Luana to enter    Home Layout  Two level      Prior Function   Level of Independence  Independent    Vocation  Retired    Office manager, walk, Programmer, applications   Overall Cognitive Status  Within Functional Limits for tasks assessed      Observation/Other Assessments   Focus on Therapeutic Outcomes (FOTO)   35% limitation      Posture/Postural Control   Posture/Postural Control  Postural limitations    Postural Limitations  Flexed trunk      ROM / Strength   AROM / PROM / Strength  AROM;PROM;Strength      AROM   Overall AROM   Within functional limits for tasks performed    Overall AROM Comments  full lumbar A/ROM without pain.  Reduced lumbar segmental mobility with sidebending bilaterally      PROM   Overall PROM   Deficits    Overall PROM Comments  hamstring flexibility limited by 25% bilaterally       Strength   Overall Strength  Within functional limits for tasks performed    Overall Strength Comments  4+/5 hip, 5/5 knee strength                Objective measurements completed on examination: See above findings.              PT Education - 10/22/18 1005    Education Details  HEP- Medbridge not availaible    Person(s) Educated  Patient    Methods  Explanation;Demonstration;Handout    Comprehension  Verbalized understanding;Returned demonstration       PT Short Term Goals - 10/22/18 0950      PT SHORT TERM GOAL #1   Title  be independent in initiation    Time  4    Period  Weeks    Status  New    Target Date  11/19/18      PT SHORT TERM GOAL #2   Title  report a 30% reduction in LBP with standing and walking    Time  4    Period  Weeks    Status  New    Target Date  11/19/18      PT SHORT TERM GOAL #3   Title  play 9 holes of golf and report < or = to 4/10 LBP after    Time  4    Period  Weeks    Status  New    Target Date  11/19/18      PT SHORT TERM GOAL #4   Title  walk for exercise with < or = to 3/10 LBP    Time  4    Period  Weeks    Status  New    Target Date  11/19/18        PT Long Term Goals - 10/22/18 0951      PT LONG TERM GOAL #1   Title  be independent in advanced HEP    Time  8    Period  Weeks    Status  New    Target Date  12/17/18      PT LONG TERM GOAL #  2   Title  reduce FOTO to < or = to 25% limitation    Time  8    Period  Weeks    Status  New    Target Date  12/17/18      PT LONG TERM GOAL #3   Title  walk for exercise without limitation due to LBP    Time  8    Period  Weeks    Status  New    Target Date  12/17/18      PT LONG TERM GOAL #4   Title  play a full round of golf and report < or = to 2/10 LBP after    Time  8    Period  Weeks    Status  New    Target Date  12/17/18      PT LONG TERM GOAL #5   Title  report < or = to 2/10 max LBP with standing to prepare a meal    Time  8     Period  Weeks    Status  New    Target Date  12/17/18             Plan - 10/22/18 1113    Clinical Impression Statement  Pt presents to PT with onset of LBP that began ~4 weeks ago when swinging a golf club.  Pt continued to play golf for a couple of week and had pain.  Pt took oral steroid and took a break from golf with some pain reduction at this time.  Pt returned to playing golf and had pain after her round.  Pt reports up to 7/10 LBP and Lt gluteal/thigh pain that is most prominent with walking and standing.  Pt demonstrates full and pain free lumbar A/ROM today.  Pt demonstrates reduced hamstring length bilaterally without pain. Pt with palpable tenderness over Lt lumbar paraspinals L3-5 and reduced PA mobility in lumbar spinal segments.  Pt will benefit from skilled PT for manual, flexibility, strength and body mechanics training to allow for return to golf and standing/walking without increased pain.    Examination-Activity Limitations  Stand;Locomotion Level    Stability/Clinical Decision Making  Stable/Uncomplicated    Clinical Decision Making  Low    Rehab Potential  Excellent    PT Frequency  2x / week    PT Duration  8 weeks    PT Treatment/Interventions  ADLs/Self Care Home Management;Electrical Stimulation;Moist Heat;Gait training;Neuromuscular re-education;Therapeutic exercise;Therapeutic activities;Functional mobility training;Patient/family education;Manual techniques;Taping;Dry needling;Spinal Manipulations;Joint Manipulations    PT Next Visit Plan  dry needling to bil lumbar spine and Lt gluteals, review HEP, hamstring/ITB flexibility    PT Home Exercise Plan  medbridge not working- in [employer] and Agree with Plan of Care  Patient       Patient will benefit from skilled therapeutic intervention in order to improve the following deficits and impairments:  Decreased activity tolerance, Difficulty walking, Impaired flexibility, Increased muscle spasms, Improper  body mechanics  Visit Diagnosis: Acute bilateral low back pain without sciatica - Plan: PT plan of care cert/re-cert  Cramp and spasm - Plan: PT plan of care cert/re-cert     Problem List Patient Active Problem List   Diagnosis Date Noted  . Strain of muscle, fascia and tendon of lower back, initial encounter 10/16/2018    Sigurd Sos, PT 10/22/18 11:25 AM  [facility] [address], [place], [place], [address] Phone: [phone]  Fax:  [phone]  Name: ABELLA EUCEDA MRN: [id number] Date of Birth: 1955-03-18

## 2018-10-31 ENCOUNTER — Other Ambulatory Visit: Payer: Self-pay

## 2018-10-31 ENCOUNTER — Ambulatory Visit: Payer: Medicare Other | Attending: Sports Medicine | Admitting: Physical Therapy

## 2018-10-31 DIAGNOSIS — R252 Cramp and spasm: Secondary | ICD-10-CM

## 2018-10-31 DIAGNOSIS — M545 Low back pain, unspecified: Secondary | ICD-10-CM

## 2018-10-31 NOTE — Therapy (Signed)
[facility] [address] [place], [place], [address] Phone: [phone]   Fax:  [phone]  Physical Therapy Treatment  Patient Details  Name: Kendra Norman MRN: [id number] Date of Birth: 1955-06-29 Referring Provider (PT): Lilia Argue, MD   Encounter Date: 10/31/2018  PT End of Session - 10/31/18 1206    Visit Number  2    Date for PT Re-Evaluation  12/17/18    Authorization Type  Medicare/Tricare    PT Start Time  0845    PT Stop Time  0928    PT Time Calculation (min)  43 min    Activity Tolerance  Patient tolerated treatment well       Past Medical History:  Diagnosis Date  . Breast cancer ([place])   . GERD (gastroesophageal reflux disease)   . Seasonal allergies     Past Surgical History:  Procedure Laterality Date  . ABDOMINAL HYSTERECTOMY    . BREAST BIOPSY    . BREAST EXCISIONAL BIOPSY Bilateral   . VEIN SURGERY      There were no vitals filed for this visit.  Subjective Assessment - 10/31/18 0845    Subjective  I played golf yesterday so I'm a little sore.  It doesn't bother me on the golf course but it bothers me later.  Going to water aerobics today.   Had Dn in the past.  It was painful but helpful.     Patient Stated Goals  reduce LBP, play golf without LBP after, walk without pain.    Currently in Pain?  Yes    Pain Score  2     Pain Location  Back    Pain Orientation  Left    Pain Type  Chronic pain    Pain Radiating Towards  lateral hip                       [facility] Adult PT Treatment/Exercise - 10/31/18 0001      Knee/Hip Exercises: Stretches   ITB Stretch  Left;2 reps;30 seconds    ITB Stretch Limitations  standing    Other Knee/Hip Stretches  review of initial HEP     Other Knee/Hip Stretches  quadruped rocks with left bias and quadruped piriformis stretch per HEP       Moist Heat Therapy   Number Minutes Moist Heat  5 Minutes    Moist Heat Location  Lumbar  Spine;Hip      Manual Therapy   Joint Mobilization  left hip long axis distraction, inferior hip mob and AP in slight internal rotation 3x 30s sec grade 3/4    Soft tissue mobilization  left QL, paraspinals, gluteals, piriformis       Trigger Point Dry Needling - 10/31/18 0001    Consent Given?  Yes    Education Handout Provided  Previously provided    Muscles Treated Back/Hip  Gluteus minimus;Gluteus medius;Gluteus maximus;Piriformis    Dry Needling Comments  left     Other Dry Needling  lumbar paraspinal threading in sidelying position;  hip muscles also performed in sidelying     Gluteus Minimus Response  Twitch response elicited;Palpable increased muscle length    Gluteus Medius Response  Twitch response elicited;Palpable increased muscle length    Gluteus Maximus Response  Twitch response elicited;Palpable increased muscle length    Piriformis Response  Twitch response elicited;Palpable increased muscle length           PT Education -  10/31/18 0931    Education Details  Access Code: [email or web address] URL: [email or web address] Date: 10/31/2018 Prepared by: Ruben Im  Exercises Standing ITB Stretch - 3 reps - 1 sets - 30 hold - 1x daily - 7x weekly Quadruped Rockback Posterior Hip Capsule Stretch - 3 reps - 1 sets - 30 hold - 1x daily - 7x weekly Hip External Rotation Stretch (Piriformis Stretch, Quadruped Figure 4 Stretch) - 3 reps - 1 sets - 30 hold - 1x daily - 7x weekly    Person(s) Educated  Patient    Methods  Explanation;Demonstration;Handout    Comprehension  Returned demonstration;Verbalized understanding       PT Short Term Goals - 10/22/18 0950      PT SHORT TERM GOAL #1   Title  be independent in initiation    Time  4    Period  Weeks    Status  New    Target Date  11/19/18      PT SHORT TERM GOAL #2   Title  report a 30% reduction in LBP with standing and walking    Time  4    Period  Weeks    Status  New    Target Date  11/19/18      PT  SHORT TERM GOAL #3   Title  play 9 holes of golf and report < or = to 4/10 LBP after    Time  4    Period  Weeks    Status  New    Target Date  11/19/18      PT SHORT TERM GOAL #4   Title  walk for exercise with < or = to 3/10 LBP    Time  4    Period  Weeks    Status  New    Target Date  11/19/18        PT Long Term Goals - 10/22/18 0951      PT LONG TERM GOAL #1   Title  be independent in advanced HEP    Time  8    Period  Weeks    Status  New    Target Date  12/17/18      PT LONG TERM GOAL #2   Title  reduce FOTO to < or = to 25% limitation    Time  8    Period  Weeks    Status  New    Target Date  12/17/18      PT LONG TERM GOAL #3   Title  walk for exercise without limitation due to LBP    Time  8    Period  Weeks    Status  New    Target Date  12/17/18      PT LONG TERM GOAL #4   Title  play a full round of golf and report < or = to 2/10 LBP after    Time  8    Period  Weeks    Status  New    Target Date  12/17/18      PT LONG TERM GOAL #5   Title  report < or = to 2/10 max LBP with standing to prepare a meal    Time  8    Period  Weeks    Status  New    Target Date  12/17/18            Plan - 10/31/18 1201    Clinical Impression Statement  The patient reports decreasing  intensity of pain today even after playing golf yesterday.  She has numerous tender points in left upper lumbar paraspinals, gluteals, piriformis muscles.  Following manual therapy and DN much improved joint and soft tissue mobility noted.  Demonstrates good compliance with initial HEP.  Therapist closely monitoring response with all treatment interventions.    Rehab Potential  Excellent    PT Frequency  2x / week    PT Treatment/Interventions  ADLs/Self Care Home Management;Electrical Stimulation;Moist Heat;Gait training;Neuromuscular re-education;Therapeutic exercise;Therapeutic activities;Functional mobility training;Patient/family education;Manual techniques;Taping;Dry  needling;Spinal Manipulations;Joint Manipulations    PT Next Visit Plan  assess response to dry needling #1 to bil lumbar spine and Lt gluteals, review HEP and progress, hamstring/ITB flexibility       Patient will benefit from skilled therapeutic intervention in order to improve the following deficits and impairments:  Decreased activity tolerance, Difficulty walking, Impaired flexibility, Increased muscle spasms, Improper body mechanics  Visit Diagnosis: Acute bilateral low back pain without sciatica  Cramp and spasm     Problem List Patient Active Problem List   Diagnosis Date Noted  . Strain of muscle, fascia and tendon of lower back, initial encounter 10/16/2018   Ruben Im, PT 10/31/18 12:08 PM Phone: [phone] Fax: [phone] Alvera Singh 10/31/2018, 12:07 PM  [facility] [address] [place], [place], [address] Phone: [phone]   Fax:  [phone]  Name: Kendra Norman MRN: [id number] Date of Birth: August 14, 1955

## 2018-10-31 NOTE — Patient Instructions (Signed)
Access Code: [email or web address]  URL: [email or web address]  Date: 10/31/2018  Prepared by: Ruben Im   Exercises  Standing ITB Stretch - 3 reps - 1 sets - 30 hold - 1x daily - 7x weekly  Quadruped Rockback Posterior Hip Capsule Stretch - 3 reps - 1 sets - 30 hold - 1x daily - 7x weekly  Hip External Rotation Stretch (Piriformis Stretch, Quadruped Figure 4 Stretch) - 3 reps - 1 sets - 30 hold - 1x daily - 7x weekly

## 2018-11-04 ENCOUNTER — Encounter: Payer: Self-pay | Admitting: Physical Therapy

## 2018-11-04 ENCOUNTER — Other Ambulatory Visit: Payer: Self-pay

## 2018-11-04 ENCOUNTER — Ambulatory Visit: Payer: Medicare Other | Admitting: Physical Therapy

## 2018-11-04 DIAGNOSIS — M545 Low back pain, unspecified: Secondary | ICD-10-CM

## 2018-11-04 DIAGNOSIS — R252 Cramp and spasm: Secondary | ICD-10-CM

## 2018-11-04 NOTE — Therapy (Signed)
[facility] [address], [place], [place], [address] Phone: [phone]   Fax:  [phone]  Physical Therapy Treatment  Patient Details  Name: Kendra Norman MRN: [id number] Date of Birth: 09/03/1955 Referring Provider (PT): Lilia Argue, MD   Encounter Date: 11/04/2018  PT End of Session - 11/04/18 2038    Visit Number  3    Date for PT Re-Evaluation  12/17/18    Authorization Type  Medicare/Tricare    PT Start Time  1519    PT Stop Time  1605    PT Time Calculation (min)  46 min    Activity Tolerance  Patient tolerated treatment well       Past Medical History:  Diagnosis Date  . Breast cancer ([place])   . GERD (gastroesophageal reflux disease)   . Seasonal allergies     Past Surgical History:  Procedure Laterality Date  . ABDOMINAL HYSTERECTOMY    . BREAST BIOPSY    . BREAST EXCISIONAL BIOPSY Bilateral   . VEIN SURGERY      There were no vitals filed for this visit.  Subjective Assessment - 11/04/18 1520    Subjective  Not as sore b/c I haven't played golf b/c of the rain.  Tournament this weekend.  No problem after the needling.   Did well with water aerobics this morning.  I like Med[employer] Go app.    Currently in Pain?  Yes    Pain Score  1     Pain Location  Back    Pain Orientation  Left    Pain Type  Chronic pain                       [facility] Adult PT Treatment/Exercise - 11/04/18 0001      Knee/Hip Exercises: Standing   SLS with Vectors  right and left SLS with contralateral 4 ways     Other Standing Knee Exercises  "stand tall" 10x rigth/left       Knee/Hip Exercises: Sidelying   Clams  with and without green band10x each       Moist Heat Therapy   Number Minutes Moist Heat  5 Minutes    Moist Heat Location  Lumbar Spine;Hip      Manual Therapy   Soft tissue mobilization  Addaday instrument assisted soft tissue work left QL, paraspinals, gluteals, piriformis        Trigger Point Dry Needling - 11/04/18 0001    Consent Given?  Yes    Muscles Treated Back/Hip  Lumbar multifidi    Lumbar multifidi Response  Palpable increased muscle length           PT Education - 11/04/18 1609    Education Details  Access Code: [email or web address] URL: [email or web address] Date: 11/04/2018 Prepared by: Ruben Im  Exercises Standing ITB Stretch - 3 reps - 1 sets - 30 hold - 1x daily - 7x weekly Quadruped Rockback Posterior Hip Capsule Stretch - 3 reps - 1 sets - 30 hold - 1x daily - 7x weekly Hip External Rotation Stretch (Piriformis Stretch, Quadruped Figure 4 Stretch) - 3 reps - 1 sets - 30 hold - 1x daily - 7x weekly Single Knee to Chest Stretch - 3 reps - 1 sets - 30 hold - 1x daily - 7x weekly Supine Lower Trunk Rotation - 3 reps - 1 sets - 30 hold - 1x daily - 7x weekly Seated Hamstring Stretch - 3 reps - 1  sets - 30 hold - 1x daily - 7x weekly Seated Piriformis Stretch - 3 reps - 1 sets - 30 hold - 1x daily - 7x weekly Single Leg Stance with Support - 10 reps - 1 sets - 1x daily - 7x weekly Clamshell with Resistance - 10 reps - 1 sets - 1x daily - 7x weekly    Person(s) Educated  Patient    Methods  Explanation;Demonstration;Handout    Comprehension  Returned demonstration;Verbalized understanding       PT Short Term Goals - 10/22/18 0950      PT SHORT TERM GOAL #1   Title  be independent in initiation    Time  4    Period  Weeks    Status  New    Target Date  11/19/18      PT SHORT TERM GOAL #2   Title  report a 30% reduction in LBP with standing and walking    Time  4    Period  Weeks    Status  New    Target Date  11/19/18      PT SHORT TERM GOAL #3   Title  play 9 holes of golf and report < or = to 4/10 LBP after    Time  4    Period  Weeks    Status  New    Target Date  11/19/18      PT SHORT TERM GOAL #4   Title  walk for exercise with < or = to 3/10 LBP    Time  4    Period  Weeks    Status  New    Target Date  11/19/18         PT Long Term Goals - 10/22/18 0951      PT LONG TERM GOAL #1   Title  be independent in advanced HEP    Time  8    Period  Weeks    Status  New    Target Date  12/17/18      PT LONG TERM GOAL #2   Title  reduce FOTO to < or = to 25% limitation    Time  8    Period  Weeks    Status  New    Target Date  12/17/18      PT LONG TERM GOAL #3   Title  walk for exercise without limitation due to LBP    Time  8    Period  Weeks    Status  New    Target Date  12/17/18      PT LONG TERM GOAL #4   Title  play a full round of golf and report < or = to 2/10 LBP after    Time  8    Period  Weeks    Status  New    Target Date  12/17/18      PT LONG TERM GOAL #5   Title  report < or = to 2/10 max LBP with standing to prepare a meal    Time  8    Period  Weeks    Status  New    Target Date  12/17/18            Plan - 11/04/18 1609    Clinical Impression Statement  The patient reports she has no hip discomfort today and mild left tighness/tenderness only.  She does have tender points along lumbar paraspinals and she is receptive to DN  this area today in conjunction with manual therapy.  No tender points identified in gluteals or pirformis today.  She is receptive to initiation of glute medius strengthening in standing and in sideyling.  She needs moderate verbal and tactile cues to avoid compensatory trunk rotation.  She would benefit from a progression of strengthening as patient had a previous episode of a similar issue.  Improved core strength would decrease incidence of recurrence.    PT Frequency  2x / week    PT Duration  8 weeks    PT Treatment/Interventions  ADLs/Self Care Home Management;Electrical Stimulation;Moist Heat;Gait training;Neuromuscular re-education;Therapeutic exercise;Therapeutic activities;Functional mobility training;Patient/family education;Manual techniques;Taping;Dry needling;Spinal Manipulations;Joint Manipulations    PT Next Visit Plan  assess  response to dry needling #2 to bil lumbar spine and previously Lt gluteals, review HEP and progress gluteal strengthening    PT Home Exercise Plan  Access Code: [email or web address] URL: [email or web address] Date: 11/04/2018 Prepared by: Ruben Im  Exercises Standing ITB Stretch - 3 reps - 1 sets - 30 hold - 1x daily - 7x weekly Quadruped Rockback Posterior Hip Capsule Stretch - 3 reps - 1 sets - 30 hold - 1x daily - 7x weekly Hip External Rotation Stretch (Piriformis Stretch, Quadruped Figure 4 Stretch) - 3 reps - 1 sets - 30 hold - 1x daily - 7x weekly Single Knee to Chest Stretch - 3 reps - 1 sets - 30 hold - 1x daily - 7x weekly Supine Lower Trunk Rotation - 3 reps - 1 sets - 30 hold - 1x daily - 7x weekly Seated Hamstring Stretch - 3 reps - 1 sets - 30 hold - 1x daily - 7x weekly Seated Piriformis Stretch - 3 reps - 1 sets - 30 hold - 1x daily - 7x weekly Single Leg Stance with Support - 10 reps - 1 sets - 1x daily - 7x weekly Clamshell with Resistance - 10 reps - 1 sets - 1x daily - 7x weekly       Patient will benefit from skilled therapeutic intervention in order to improve the following deficits and impairments:  Decreased activity tolerance, Difficulty walking, Impaired flexibility, Increased muscle spasms, Improper body mechanics  Visit Diagnosis: Acute bilateral low back pain without sciatica  Cramp and spasm     Problem List Patient Active Problem List   Diagnosis Date Noted  . Strain of muscle, fascia and tendon of lower back, initial encounter 10/16/2018   Ruben Im, PT 11/04/18 8:46 PM Phone: [phone] Fax: [phone] Alvera Singh 11/04/2018, 8:45 PM   [facility] [address] [place], [place], [address] Phone: [phone]   Fax:  [phone]  Name: Kendra Norman MRN: [id number] Date of Birth: Nov 22, 1955

## 2018-11-04 NOTE — Patient Instructions (Signed)
Access Code: [email or web address]  URL: [email or web address]  Date: 11/04/2018  Prepared by: Ruben Im   Exercises  Standing ITB Stretch - 3 reps - 1 sets - 30 hold - 1x daily - 7x weekly  Quadruped Rockback Posterior Hip Capsule Stretch - 3 reps - 1 sets - 30 hold - 1x daily - 7x weekly  Hip External Rotation Stretch (Piriformis Stretch, Quadruped Figure 4 Stretch) - 3 reps - 1 sets - 30 hold - 1x daily - 7x weekly  Single Knee to Chest Stretch - 3 reps - 1 sets - 30 hold - 1x daily - 7x weekly  Supine Lower Trunk Rotation - 3 reps - 1 sets - 30 hold - 1x daily - 7x weekly  Seated Hamstring Stretch - 3 reps - 1 sets - 30 hold - 1x daily - 7x weekly  Seated Piriformis Stretch - 3 reps - 1 sets - 30 hold - 1x daily - 7x weekly  Single Leg Stance with Support - 10 reps - 1 sets - 1x daily - 7x weekly  Clamshell with Resistance - 10 reps - 1 sets - 1x daily - 7x weekly

## 2018-11-07 ENCOUNTER — Ambulatory Visit: Payer: Medicare Other | Admitting: Physical Therapy

## 2018-11-07 ENCOUNTER — Other Ambulatory Visit: Payer: Self-pay

## 2018-11-07 DIAGNOSIS — R252 Cramp and spasm: Secondary | ICD-10-CM

## 2018-11-07 DIAGNOSIS — M545 Low back pain, unspecified: Secondary | ICD-10-CM

## 2018-11-07 NOTE — Therapy (Signed)
[facility] [address], [place], [place], [address] Phone: [phone]   Fax:  [phone]  Physical Therapy Treatment  Patient Details  Name: Kendra Norman MRN: [id number] Date of Birth: 11-Mar-1955 Referring Provider (PT): Lilia Argue, MD   Encounter Date: 11/07/2018  PT End of Session - 11/07/18 1314    Visit Number  4    Date for PT Re-Evaluation  12/17/18    Authorization Type  Medicare/Tricare    PT Start Time  0755    PT Stop Time  0842    PT Time Calculation (min)  47 min    Activity Tolerance  Patient tolerated treatment well       Past Medical History:  Diagnosis Date  . Breast cancer ([place])   . GERD (gastroesophageal reflux disease)   . Seasonal allergies     Past Surgical History:  Procedure Laterality Date  . ABDOMINAL HYSTERECTOMY    . BREAST BIOPSY    . BREAST EXCISIONAL BIOPSY Bilateral   . VEIN SURGERY      There were no vitals filed for this visit.  Subjective Assessment - 11/07/18 0757    Subjective  Left tightness in back but not painful after playing golf yesterday.  Playing in a golf tournament this weekend.    Patient Stated Goals  reduce LBP, play golf without LBP after, walk without pain.    Currently in Pain?  Yes    Pain Location  Back                       [facility] Adult PT Treatment/Exercise - 11/07/18 0001      Therapeutic Activites    Lifting  hip hinge with golf club 2x 10;  dead lift chair 8# 10x dead lift to mid shin 8# 10x       Lumbar Exercises: Quadruped   Opposite Arm/Leg Raise  Right arm/Left leg;Left arm/Right leg;5 reps      Knee/Hip Exercises: Stretches   Other Knee/Hip Stretches  doorway psoas stretch with UE movements 3x5     Other Knee/Hip Stretches  childs pose with bias       Knee/Hip Exercises: Standing   SLS  SLS with opp hand holding green band diagonals 2x 10 each side     Other Standing Knee Exercises  single leg dead lift  with other foot on wall 8# 10x     Other Standing Knee Exercises  comprehensive review of HEP including shuffling to group them by position as patient likes to follow along and do them with the [place] app       Manual Therapy   Soft tissue mobilization  Addaday to left lumbar region with childs pose stretch              PT Education - 11/07/18 0838    Education Details  Access Code: [email or web address] URL: [email or web address] Date: 11/07/2018 Prepared by: Ruben Im  Exercises Standing ITB Stretch - 3 reps - 1 sets - 30 hold - 1x daily - 7x weekly Seated Piriformis Stretch - 3 reps - 1 sets - 30 hold - 1x daily - 7x weekly Seated Hamstring Stretch - 3 reps - 1 sets - 30 hold - 1x daily - 7x weekly Single Knee to Chest Stretch - 3 reps - 1 sets - 30 hold - 1x daily - 7x weekly Supine Lower Trunk Rotation - 3 reps - 1 sets - 30 hold - 1x daily -  7x weekly Clamshell with Resistance - 10 reps - 1 sets - 1x daily - 7x weekly Quadruped Rockback Posterior Hip Capsule Stretch - 3 reps - 1 sets - 30 hold - 1x daily - 7x weekly Hip External Rotation Stretch (Piriformis Stretch, Quadruped Figure 4 Stretch) - 3 reps - 1 sets - 30 hold - 1x daily - 7x weekly Bird Dog - 10 reps - 1 sets - 1x daily - 7x weekly Child's Pose with Sidebending - 3 reps - 1 sets - 30 hold - 1x daily - 7x weekly Single Leg Stance with Support - 10 reps - 1 sets - 1x daily - 7x weekly Doorway Hip Flexor Stretch with Chair - 5 reps - 6 sets - 1x daily - 7x weekly Standing Hip Hinge with Dowel - 10 reps - 1 sets - 1x daily - 7x weekly Half Dead Lift with Kettlebell - 10 reps - 1 sets - 1x daily - 7x weekly Single Leg Deadlift with Kettlebell - 10 reps - 2 sets - 1x daily - 7x weekly    Person(s) Educated  Patient    Methods  Explanation;Demonstration;Handout    Comprehension  Returned demonstration;Verbalized understanding       PT Short Term Goals - 10/22/18 0950      PT SHORT TERM GOAL #1   Title  be independent in  initiation    Time  4    Period  Weeks    Status  New    Target Date  11/19/18      PT SHORT TERM GOAL #2   Title  report a 30% reduction in LBP with standing and walking    Time  4    Period  Weeks    Status  New    Target Date  11/19/18      PT SHORT TERM GOAL #3   Title  play 9 holes of golf and report < or = to 4/10 LBP after    Time  4    Period  Weeks    Status  New    Target Date  11/19/18      PT SHORT TERM GOAL #4   Title  walk for exercise with < or = to 3/10 LBP    Time  4    Period  Weeks    Status  New    Target Date  11/19/18        PT Long Term Goals - 10/22/18 0951      PT LONG TERM GOAL #1   Title  be independent in advanced HEP    Time  8    Period  Weeks    Status  New    Target Date  12/17/18      PT LONG TERM GOAL #2   Title  reduce FOTO to < or = to 25% limitation    Time  8    Period  Weeks    Status  New    Target Date  12/17/18      PT LONG TERM GOAL #3   Title  walk for exercise without limitation due to LBP    Time  8    Period  Weeks    Status  New    Target Date  12/17/18      PT LONG TERM GOAL #4   Title  play a full round of golf and report < or = to 2/10 LBP after    Time  8  Period  Weeks    Status  New    Target Date  12/17/18      PT LONG TERM GOAL #5   Title  report < or = to 2/10 max LBP with standing to prepare a meal    Time  8    Period  Weeks    Status  New    Target Date  12/17/18            Plan - 11/07/18 [id number]    Clinical Impression Statement  The patient continues to report decreasing back and hip pain as she returns to playing more golf.  She is able to progress intensity of lumbo/pelvic/hip strengthening today without production of pain.  She is receptive to initiation of hip hinging and dead lifting for strengthening and functional mobility for golf.  Therapist providing verbal and tactile cues for positioning and technique.    Rehab Potential  Excellent    PT Frequency  2x / week    PT  Duration  8 weeks    PT Treatment/Interventions  ADLs/Self Care Home Management;Electrical Stimulation;Moist Heat;Gait training;Neuromuscular re-education;Therapeutic exercise;Therapeutic activities;Functional mobility training;Patient/family education;Manual techniques;Taping;Dry needling;Spinal Manipulations;Joint Manipulations    PT Next Visit Plan  DN as needed for lumbar and hip;  core stabilization progression;  review hip hinge and dead lifting    PT Home Exercise Plan  Access Code: [email or web address]         Patient will benefit from skilled therapeutic intervention in order to improve the following deficits and impairments:  Decreased activity tolerance, Difficulty walking, Impaired flexibility, Increased muscle spasms, Improper body mechanics  Visit Diagnosis: Acute bilateral low back pain without sciatica  Cramp and spasm     Problem List Patient Active Problem List   Diagnosis Date Noted  . Strain of muscle, fascia and tendon of lower back, initial encounter 10/16/2018   Ruben Im, PT 11/07/18 1:23 PM Phone: [phone] Fax: [phone] Alvera Singh 11/07/2018, 1:23 PM  [facility] [address], [place], [place], [address] Phone: [phone]   Fax:  [phone]  Name: Kendra Norman MRN: [id number] Date of Birth: 1955/03/08

## 2018-11-07 NOTE — Patient Instructions (Signed)
Access Code: [email or web address]  URL: [email or web address]  Date: 11/07/2018  Prepared by: Ruben Im   Exercises  Standing ITB Stretch - 3 reps - 1 sets - 30 hold - 1x daily - 7x weekly  Seated Piriformis Stretch - 3 reps - 1 sets - 30 hold - 1x daily - 7x weekly  Seated Hamstring Stretch - 3 reps - 1 sets - 30 hold - 1x daily - 7x weekly  Single Knee to Chest Stretch - 3 reps - 1 sets - 30 hold - 1x daily - 7x weekly  Supine Lower Trunk Rotation - 3 reps - 1 sets - 30 hold - 1x daily - 7x weekly  Clamshell with Resistance - 10 reps - 1 sets - 1x daily - 7x weekly  Quadruped Rockback Posterior Hip Capsule Stretch - 3 reps - 1 sets - 30 hold - 1x daily - 7x weekly  Hip External Rotation Stretch (Piriformis Stretch, Quadruped Figure 4 Stretch) - 3 reps - 1 sets - 30 hold - 1x daily - 7x weekly  Bird Dog - 10 reps - 1 sets - 1x daily - 7x weekly  Child's Pose with Sidebending - 3 reps - 1 sets - 30 hold - 1x daily - 7x weekly  Single Leg Stance with Support - 10 reps - 1 sets - 1x daily - 7x weekly  Doorway Hip Flexor Stretch with Chair - 5 reps - 6 sets - 1x daily - 7x weekly  Standing Hip Hinge with Dowel - 10 reps - 1 sets - 1x daily - 7x weekly  Half Dead Lift with Kettlebell - 10 reps - 1 sets - 1x daily - 7x weekly  Single Leg Deadlift with Kettlebell - 10 reps - 2 sets - 1x daily - 7x weekly

## 2018-11-17 ENCOUNTER — Ambulatory Visit: Payer: Medicare Other

## 2018-11-17 ENCOUNTER — Other Ambulatory Visit: Payer: Self-pay

## 2018-11-17 DIAGNOSIS — M545 Low back pain, unspecified: Secondary | ICD-10-CM

## 2018-11-17 DIAGNOSIS — R252 Cramp and spasm: Secondary | ICD-10-CM

## 2018-11-17 NOTE — Therapy (Signed)
[facility] [address], [place], [place], [address] Phone: [phone]   Fax:  [phone]  Physical Therapy Treatment  Patient Details  Name: Kendra Norman MRN: [id number] Date of Birth: [birth year] Referring Provider (PT): Lilia Argue, MD   Encounter Date: 11/17/2018  PT End of Session - 11/17/18 0843    Visit Number  5    Date for PT Re-Evaluation  12/17/18    Authorization Type  Medicare/Tricare    PT Start Time  0801    PT Stop Time  0852    PT Time Calculation (min)  51 min    Activity Tolerance  Patient tolerated treatment well    Behavior During Therapy  [facility] for tasks assessed/performed       Past Medical History:  Diagnosis Date  . Breast cancer ([place])   . GERD (gastroesophageal reflux disease)   . Seasonal allergies     Past Surgical History:  Procedure Laterality Date  . ABDOMINAL HYSTERECTOMY    . BREAST BIOPSY    . BREAST EXCISIONAL BIOPSY Bilateral   . VEIN SURGERY      There were no vitals filed for this visit.  Subjective Assessment - 11/17/18 0802    Subjective  I didn't have pain last week until after I played golf.  No pain in the legs at all, just LBP.    Patient Stated Goals  reduce LBP, play golf without LBP after, walk without pain.    Currently in Pain?  Yes    Pain Score  3     Pain Location  Back    Pain Orientation  Left    Pain Descriptors / Indicators  Sharp    Pain Type  Chronic pain    Pain Frequency  Intermittent    Aggravating Factors   after playing golf, walking, standing too long    Pain Relieving Factors  heat/ice, Naproxen, Biofreeze                       [facility] Adult PT Treatment/Exercise - 11/17/18 0001      Knee/Hip Exercises: Stretches   Active Hamstring Stretch  Left;Right;3 reps;20 seconds    Piriformis Stretch  Both;3 reps;20 seconds      Moist Heat Therapy   Number Minutes Moist Heat  10 Minutes    Moist Heat Location  Lumbar  Spine;Hip      Manual Therapy   Manual Therapy  Joint mobilization;Soft tissue mobilization    Soft tissue mobilization  elongation and trigger point release to bil lumbar spine and gluteals       Trigger Point Dry Needling - 11/17/18 0001    Consent Given?  Yes    Muscles Treated Back/Hip  Lumbar multifidi    Gluteus Minimus Response  Twitch response elicited;Palpable increased muscle length    Gluteus Medius Response  Twitch response elicited;Palpable increased muscle length    Gluteus Maximus Response  Twitch response elicited;Palpable increased muscle length    Piriformis Response  Twitch response elicited;Palpable increased muscle length    Lumbar multifidi Response  Palpable increased muscle length;Twitch response elicited             PT Short Term Goals - 11/17/18 0804      PT SHORT TERM GOAL #1   Title  be independent in initiation of HEP    Status  Achieved      PT SHORT TERM GOAL #2   Title  report a 30% reduction in LBP with standing and walking    Baseline  50-80% overall improvement      PT SHORT TERM GOAL #3   Title  play 9 holes of golf and report < or = to 4/10 LBP after    Baseline  played 18 holes with increased walking- 6/10, played a par 3 course- no pain    Status  Achieved      PT SHORT TERM GOAL #4   Title  walk for exercise with < or = to 3/10 LBP    Status  Achieved        PT Long Term Goals - 10/22/18 0951      PT LONG TERM GOAL #1   Title  be independent in advanced HEP    Time  8    Period  Weeks    Status  New    Target Date  12/17/18      PT LONG TERM GOAL #2   Title  reduce FOTO to < or = to 25% limitation    Time  8    Period  Weeks    Status  New    Target Date  12/17/18      PT LONG TERM GOAL #3   Title  walk for exercise without limitation due to LBP    Time  8    Period  Weeks    Status  New    Target Date  12/17/18      PT LONG TERM GOAL #4   Title  play a full round of golf and report < or = to 2/10 LBP after     Time  8    Period  Weeks    Status  New    Target Date  12/17/18      PT LONG TERM GOAL #5   Title  report < or = to 2/10 max LBP with standing to prepare a meal    Time  8    Period  Weeks    Status  New    Target Date  12/17/18            Plan - 11/17/18 0813    Clinical Impression Statement  Pt is making steady progress toward goals.  Pt reports 50-80% overall improvement in lumbar symptoms since the start of care.  Pt is able to walk for exercise without increased pain. Pt played golf (18 holes) and reports 3/10 LBP now.  Pt denies any LE pain at this time and pain has centralized to the lumbar spine.  Session focused on flexibility and manual therapy to address lumbar and gluteal tension.  Pt with tension and trigger points in lumbar spine and proximal gluteals and demonstrated improved tissue mobility and reduced pain after manual therapy today.  Pt will continue to benefit from skilled PT to address core and hip strength, flexibility and manual therapy to address LBP.    Rehab Potential  Excellent    PT Frequency  2x / week    PT Duration  8 weeks    PT Treatment/Interventions  ADLs/Self Care Home Management;Electrical Stimulation;Moist Heat;Gait training;Neuromuscular re-education;Therapeutic exercise;Therapeutic activities;Functional mobility training;Patient/family education;Manual techniques;Taping;Dry needling;Spinal Manipulations;Joint Manipulations    PT Next Visit Plan  assess response to dry needling, core stabilization, review hip hinge and dead lift    PT Home Exercise Plan  [email or web address]    Consulted and Agree with Plan of Care  Patient       Patient  will benefit from skilled therapeutic intervention in order to improve the following deficits and impairments:  Decreased activity tolerance, Difficulty walking, Impaired flexibility, Increased muscle spasms, Improper body mechanics  Visit Diagnosis: Cramp and spasm  Acute bilateral low back pain without  sciatica     Problem List Patient Active Problem List   Diagnosis Date Noted  . Strain of muscle, fascia and tendon of lower back, initial encounter 10/16/2018     Sigurd Sos, PT 11/17/18 8:45 AM  [facility] [address], [place], [place], [address] Phone: [phone]   Fax:  [phone]  Name: HILDE CASHMORE MRN: [id number] Date of Birth: [birth year]

## 2018-11-18 ENCOUNTER — Ambulatory Visit: Payer: Medicare Other | Admitting: Sports Medicine

## 2018-11-19 ENCOUNTER — Ambulatory Visit: Payer: Medicare Other

## 2018-11-19 ENCOUNTER — Other Ambulatory Visit: Payer: Self-pay

## 2018-11-19 DIAGNOSIS — M545 Low back pain, unspecified: Secondary | ICD-10-CM

## 2018-11-19 DIAGNOSIS — R252 Cramp and spasm: Secondary | ICD-10-CM

## 2018-11-19 NOTE — Therapy (Signed)
[facility] [address], [place], [place], [address] Phone: [phone]   Fax:  [phone]  Physical Therapy Treatment  Patient Details  Name: Kendra Norman MRN: [id number] Date of Birth: Jan 03, 1956 Referring Provider (PT): Lilia Argue, MD   Encounter Date: 11/19/2018  PT End of Session - 11/19/18 0927    Visit Number  6    Date for PT Re-Evaluation  12/17/18    Authorization Type  Medicare/Tricare    PT Start Time  0846    PT Stop Time  0931    PT Time Calculation (min)  45 min    Activity Tolerance  Patient tolerated treatment well    Behavior During Therapy  [facility] for tasks assessed/performed       Past Medical History:  Diagnosis Date  . Breast cancer ([place])   . GERD (gastroesophageal reflux disease)   . Seasonal allergies     Past Surgical History:  Procedure Laterality Date  . ABDOMINAL HYSTERECTOMY    . BREAST BIOPSY    . BREAST EXCISIONAL BIOPSY Bilateral   . VEIN SURGERY      There were no vitals filed for this visit.  Subjective Assessment - 11/19/18 0853    Subjective  I am having increased LBP today.  I felt better after dry needling.    Currently in Pain?  Yes    Pain Score  3     Pain Location  Back    Pain Orientation  Left    Pain Descriptors / Indicators  Sharp    Pain Type  Chronic pain    Pain Onset  More than a month ago    Pain Frequency  Intermittent    Aggravating Factors   walking, standing too long    Pain Relieving Factors  heat/ice, Naproxen, Biofreeze         [facility] PT Assessment - 11/19/18 0001      Assessment   Medical Diagnosis  strain of lumbar region    Referring Provider (PT)  Lilia Argue, MD    Onset Date/Surgical Date  09/21/18      Observation/Other Assessments   Focus on Therapeutic Outcomes (FOTO)   33% limitation                   [facility] Adult PT Treatment/Exercise - 11/19/18 0001      Exercises   Exercises  Lumbar      Lumbar Exercises: Stretches   Active Hamstring Stretch  Left;Right;3 reps;20 seconds   supine with strap   Piriformis Stretch  Both;3 reps;20 seconds      Lumbar Exercises: Aerobic   Nustep  Level 2x 8 minutes    PT present to assess     Lumbar Exercises: Standing   Other Standing Lumbar Exercises  dead lift 10# x10      Lumbar Exercises: Supine   Ab Set  20 reps;5 seconds    AB Set Limitations  with ball squeeze      Lumbar Exercises: Sidelying   Clam  Both;20 reps   green band   Other Sidelying Lumbar Exercises  open book x 10 bil       Manual Therapy   Manual Therapy  Soft tissue mobilization;Myofascial release    Manual therapy comments  Addaday to lumbar paraspinals and gluteals               PT Short Term Goals - 11/17/18 [id number]      PT  SHORT TERM GOAL #1   Title  be independent in initiation of HEP    Status  Achieved      PT SHORT TERM GOAL #2   Title  report a 30% reduction in LBP with standing and walking    Baseline  50-80% overall improvement      PT SHORT TERM GOAL #3   Title  play 9 holes of golf and report < or = to 4/10 LBP after    Baseline  played 18 holes with increased walking- 6/10, played a par 3 course- no pain    Status  Achieved      PT SHORT TERM GOAL #4   Title  walk for exercise with < or = to 3/10 LBP    Status  Achieved        PT Long Term Goals - 10/22/18 0951      PT LONG TERM GOAL #1   Title  be independent in advanced HEP    Time  8    Period  Weeks    Status  New    Target Date  12/17/18      PT LONG TERM GOAL #2   Title  reduce FOTO to < or = to 25% limitation    Time  8    Period  Weeks    Status  New    Target Date  12/17/18      PT LONG TERM GOAL #3   Title  walk for exercise without limitation due to LBP    Time  8    Period  Weeks    Status  New    Target Date  12/17/18      PT LONG TERM GOAL #4   Title  play a full round of golf and report < or = to 2/10 LBP after    Time  8    Period  Weeks     Status  New    Target Date  12/17/18      PT LONG TERM GOAL #5   Title  report < or = to 2/10 max LBP with standing to prepare a meal    Time  8    Period  Weeks    Status  New    Target Date  12/17/18            Plan - 11/19/18 [id number]    Clinical Impression Statement  Pt is making steady progress toward goals.  Pt reports 50-80% overall improvement in lumbar symptoms since the start of care.  Pt is able to walk for exercise without increased pain. Pt has had increased LBP over the past 2 days.  Pt denies any LE pain at this time and pain has centralized to the lumbar spine.  Session focused on flexibility, core strength, manual therapy to address lumbar and gluteal tension.  Pt requires tactile and verbal cues for technique with hamstring stretch and sidelying clam.  Pt will continue to benefit from skilled PT to address core and hip strength, flexibility and manual therapy to address LBP.    PT Frequency  2x / week    PT Duration  8 weeks    PT Treatment/Interventions  ADLs/Self Care Home Management;Electrical Stimulation;Moist Heat;Gait training;Neuromuscular re-education;Therapeutic exercise;Therapeutic activities;Functional mobility training;Patient/family education;Manual techniques;Taping;Dry needling;Spinal Manipulations;Joint Manipulations    PT Next Visit Plan  dry needling to lumbar and gluteals, core strength, flexibility    PT Home Exercise Plan  [id number]    Recommended Other  Services  initial certification is signed    Consulted and Agree with Plan of Care  Patient       Patient will benefit from skilled therapeutic intervention in order to improve the following deficits and impairments:  Decreased activity tolerance, Difficulty walking, Impaired flexibility, Increased muscle spasms, Improper body mechanics  Visit Diagnosis: Cramp and spasm  Acute bilateral low back pain without sciatica     Problem List Patient Active Problem List   Diagnosis Date Noted  .  Strain of muscle, fascia and tendon of lower back, initial encounter 10/16/2018     Sigurd Sos, PT 11/19/18 9:40 AM  [facility] [address] [place], [place], [address] Phone: [phone]   Fax:  [phone]  Name: SHONYA REWERTS MRN: [id number] Date of Birth: 21-Mar-1955

## 2018-11-25 ENCOUNTER — Ambulatory Visit (INDEPENDENT_AMBULATORY_CARE_PROVIDER_SITE_OTHER): Payer: Medicare Other | Admitting: Sports Medicine

## 2018-11-25 ENCOUNTER — Other Ambulatory Visit: Payer: Self-pay

## 2018-11-25 VITALS — BP 126/78 | Ht 70.0 in | Wt 217.0 lb

## 2018-11-25 DIAGNOSIS — S39012D Strain of muscle, fascia and tendon of lower back, subsequent encounter: Secondary | ICD-10-CM

## 2018-11-26 ENCOUNTER — Ambulatory Visit: Payer: Medicare Other | Attending: Sports Medicine | Admitting: Physical Therapy

## 2018-11-26 ENCOUNTER — Encounter: Payer: Self-pay | Admitting: Physical Therapy

## 2018-11-26 DIAGNOSIS — R252 Cramp and spasm: Secondary | ICD-10-CM | POA: Insufficient documentation

## 2018-11-26 DIAGNOSIS — M545 Low back pain, unspecified: Secondary | ICD-10-CM

## 2018-11-26 NOTE — Progress Notes (Signed)
   Subjective:    Patient ID: Kendra Norman, female    DOB: 1955/10/25, 63 y.o.   MRN: [id number]  HPI   Patient comes in today for follow-up on a lumbar strain.  Overall she seems to be improving.  She did have a setback a little over a week ago while playing golf in [place].  Since returning to [facility] her pain has improved.  She continues to work with physical therapy and has noticed some improvement with this.  She also takes naproxen sodium daily for general aches and pains but will take it twice a day if needed.  She continues to deny radiating pain into her legs.  No associated numbness or tingling.  Symptoms are worse with standing and walking and improve at rest.   Review of Systems    As above Objective:   Physical Exam  Well-developed, well-nourished.  No acute distress.  Awake alert and oriented x3.  Vital signs reviewed.  Lumbar spine: Full lumbar range of motion.  No tenderness to palpation. Neurological exam: No focal neurological deficit of either lower extremity grossly.      Assessment & Plan:   Improving low back pain secondary to lumbar strain versus facet arthropathy  Given the patient's overall improvement I am going to hold on any further work-up or treatment at this time.  I think she should continue working with physical therapy until ready for discharge to home exercise program.  She may need to discontinue golf for a few weeks to let this episode of pain settle down but if she finds that she is unable to return to her previous level of activity after this period of relative rest that I recommended that we consider diagnostic imaging at that time before deciding on further treatment.  Patient is in agreement with that plan.  Follow-up as needed.

## 2018-11-26 NOTE — Therapy (Signed)
[facility] [address], [place], [place], [address] Phone: [phone]   Fax:  [phone]  Physical Therapy Treatment  Patient Details  Name: Kendra Norman MRN: [id number] Date of Birth: 1955/11/08 Referring Provider (PT): Lilia Argue, MD   Encounter Date: 11/26/2018  PT End of Session - 11/26/18 0924    Visit Number  7    Date for PT Re-Evaluation  12/17/18    Authorization Type  Medicare/Tricare    PT Start Time  0845    PT Stop Time  0923    PT Time Calculation (min)  38 min    Activity Tolerance  Patient tolerated treatment well;No increased pain    Behavior During Therapy  [facility] for tasks assessed/performed       Past Medical History:  Diagnosis Date  . Breast cancer ([place])   . GERD (gastroesophageal reflux disease)   . Seasonal allergies     Past Surgical History:  Procedure Laterality Date  . ABDOMINAL HYSTERECTOMY    . BREAST BIOPSY    . BREAST EXCISIONAL BIOPSY Bilateral   . VEIN SURGERY      There were no vitals filed for this visit.  Subjective Assessment - 11/26/18 0847    Subjective  Pt states that she is doing well. She has been doing her exercises and has noticed no pain today.    Currently in Pain?  No/denies    Pain Onset  More than a month ago                       [facility] Adult PT Treatment/Exercise - 11/26/18 0001      Self-Care   Self-Care  Posture    Posture  encouraged pt to alternate LE with single leg deadlift when picking her ball up from the ground      Exercises   Exercises  Knee/Hip      Lumbar Exercises: Stretches   Active Hamstring Stretch Limitations  10x5 sec hold 90/90      Lumbar Exercises: Aerobic   Nustep  L2 x8 min, PT present to discuss golf return and ways to modify       Lumbar Exercises: Standing   Other Standing Lumbar Exercises  NBOS pallof press with double red TB x10 reps each direction    Other Standing Lumbar Exercises   deadlift #10 demo with combined knee flexion x6 reps      Knee/Hip Exercises: Stretches   Other Knee/Hip Stretches  standing wiht LE on 2nd step, active assisted stretch hip IR/ER x10 reps       Knee/Hip Exercises: Seated   Other Seated Knee/Hip Exercises  Lt and Rt hip IR/ER x10 reps yellow TB              PT Education - 11/26/18 0924    Education Details  self care with golf lifts; technique with therex    Person(s) Educated  Patient    Methods  Explanation;Verbal cues    Comprehension  Verbalized understanding;Returned demonstration       PT Short Term Goals - 11/17/18 0804      PT SHORT TERM GOAL #1   Title  be independent in initiation of HEP    Status  Achieved      PT SHORT TERM GOAL #2   Title  report a 30% reduction in LBP with standing and walking    Baseline  50-80% overall improvement  PT SHORT TERM GOAL #3   Title  play 9 holes of golf and report < or = to 4/10 LBP after    Baseline  played 18 holes with increased walking- 6/10, played a par 3 course- no pain    Status  Achieved      PT SHORT TERM GOAL #4   Title  walk for exercise with < or = to 3/10 LBP    Status  Achieved        PT Long Term Goals - 10/22/18 0951      PT LONG TERM GOAL #1   Title  be independent in advanced HEP    Time  8    Period  Weeks    Status  New    Target Date  12/17/18      PT LONG TERM GOAL #2   Title  reduce FOTO to < or = to 25% limitation    Time  8    Period  Weeks    Status  New    Target Date  12/17/18      PT LONG TERM GOAL #3   Title  walk for exercise without limitation due to LBP    Time  8    Period  Weeks    Status  New    Target Date  12/17/18      PT LONG TERM GOAL #4   Title  play a full round of golf and report < or = to 2/10 LBP after    Time  8    Period  Weeks    Status  New    Target Date  12/17/18      PT LONG TERM GOAL #5   Title  report < or = to 2/10 max LBP with standing to prepare a meal    Time  8    Period  Weeks     Status  New    Target Date  12/17/18            Plan - 11/26/18 [id number]    Clinical Impression Statement  Pt reports feeling great this past week and is having no issues with her HEP. Session continued with focus on increasing hip ROM and strength in addition to standing trunk stability. Pt was able to complete standing pallof press without increase in low back/buttock pain. Therapist also provided education regarding safe mechanics while golfing and she was able to demonstrate understanding of this by the end of today's session. Pt will be golfing tomorrow, so we will follow up with her later this week to see how this goes for her.    PT Frequency  2x / week    PT Duration  8 weeks    PT Treatment/Interventions  ADLs/Self Care Home Management;Electrical Stimulation;Moist Heat;Gait training;Neuromuscular re-education;Therapeutic exercise;Therapeutic activities;Functional mobility training;Patient/family education;Manual techniques;Taping;Dry needling;Spinal Manipulations;Joint Manipulations    PT Next Visit Plan  f/u on pain with golfing; dry needling to lumbar and gluteals if needed, core strength, flexibility    PT Home Exercise Plan  [id number]    Consulted and Agree with Plan of Care  Patient       Patient will benefit from skilled therapeutic intervention in order to improve the following deficits and impairments:  Decreased activity tolerance, Difficulty walking, Impaired flexibility, Increased muscle spasms, Improper body mechanics  Visit Diagnosis: Cramp and spasm  Acute bilateral low back pain without sciatica     Problem List Patient Active Problem List   Diagnosis  Date Noted  . Strain of muscle, fascia and tendon of lower back, initial encounter 10/16/2018    9:29 AM,11/26/18 Sherol Dade PT, DPT [facility] [address], [place],  [place], [address] Phone: [phone]   Fax:  [phone]  Name: Kendra Norman MRN: [id number] Date of Birth: Apr 03, 1955

## 2018-11-28 ENCOUNTER — Ambulatory Visit: Payer: Medicare Other | Admitting: Physical Therapy

## 2018-11-28 ENCOUNTER — Other Ambulatory Visit: Payer: Self-pay

## 2018-11-28 ENCOUNTER — Encounter: Payer: Self-pay | Admitting: Physical Therapy

## 2018-11-28 DIAGNOSIS — M545 Low back pain, unspecified: Secondary | ICD-10-CM

## 2018-11-28 DIAGNOSIS — R252 Cramp and spasm: Secondary | ICD-10-CM | POA: Diagnosis not present

## 2018-11-28 NOTE — Patient Instructions (Signed)
Access Code: [email or web address]  URL: [email or web address]  Date: 11/28/2018  Prepared by: Ruben Im   Exercises  Standing ITB Stretch - 3 reps - 1 sets - 30 hold - 1x daily - 7x weekly  Seated Piriformis Stretch - 3 reps - 1 sets - 30 hold - 1x daily - 7x weekly  Seated Hamstring Stretch - 3 reps - 1 sets - 30 hold - 1x daily - 7x weekly  Single Knee to Chest Stretch - 3 reps - 1 sets - 30 hold - 1x daily - 7x weekly  Supine Lower Trunk Rotation - 3 reps - 1 sets - 30 hold - 1x daily - 7x weekly  Clamshell with Resistance - 10 reps - 1 sets - 1x daily - 7x weekly  Quadruped Rockback Posterior Hip Capsule Stretch - 3 reps - 1 sets - 30 hold - 1x daily - 7x weekly  Hip External Rotation Stretch (Piriformis Stretch, Quadruped Figure 4 Stretch) - 3 reps - 1 sets - 30 hold - 1x daily - 7x weekly  Bird Dog - 10 reps - 1 sets - 1x daily - 7x weekly  Child's Pose with Sidebending - 3 reps - 1 sets - 30 hold - 1x daily - 7x weekly  Single Leg Stance with Support - 10 reps - 1 sets - 1x daily - 7x weekly  Doorway Hip Flexor Stretch with Chair - 5 reps - 6 sets - 1x daily - 7x weekly  Standing Hip Hinge with Dowel - 10 reps - 1 sets - 1x daily - 7x weekly  Half Dead Lift with Kettlebell - 10 reps - 1 sets - 1x daily - 7x weekly  Single Leg Deadlift with Kettlebell - 10 reps - 2 sets - 1x daily - 7x weekly  Sidelying Thoracic and Shoulder Rotation - 10 reps - 1 sets - 1x daily - 7x weekly  Seated Quadratus Lumborum Stretch with Arm Overhead - 3 reps - 1 sets - 30 hold - 1x daily - 7x weekly  Sidelying Quadratus Lumborum Stretch on Table - 3 reps - 1 sets - 30 hold - 1x daily - 7x weekly  Quadratus Lumborum Stretch with Noodle Overhead - 5 reps - 1 sets - 30 hold - 1x daily - 7x weekly

## 2018-11-28 NOTE — Therapy (Signed)
[facility] [address], [place], [place], [address] Phone: [phone]   Fax:  [phone]  Physical Therapy Treatment  Patient Details  Name: Kendra Norman MRN: [id number] Date of Birth: July 27, 1955 Referring Provider (PT): Lilia Argue, MD   Encounter Date: 11/28/2018  PT End of Session - 11/28/18 1219    Visit Number  8    Date for PT Re-Evaluation  12/17/18    Authorization Type  Medicare/Tricare    PT Start Time  0929    PT Stop Time  1007    PT Time Calculation (min)  38 min    Activity Tolerance  Patient tolerated treatment well;No increased pain       Past Medical History:  Diagnosis Date  . Breast cancer ([place])   . GERD (gastroesophageal reflux disease)   . Seasonal allergies     Past Surgical History:  Procedure Laterality Date  . ABDOMINAL HYSTERECTOMY    . BREAST BIOPSY    . BREAST EXCISIONAL BIOPSY Bilateral   . VEIN SURGERY      There were no vitals filed for this visit.  Subjective Assessment - 11/28/18 0929    Subjective  Played golf yesterday.  Left sided spot of back (QL region).  No hip/buttock pain.  Feels it sit to stand.  Already did my water aerobics this morning.    Patient Stated Goals  reduce LBP, play golf without LBP after, walk without pain.    Currently in Pain?  Yes    Pain Score  3     Pain Location  Back    Pain Orientation  Left    Pain Type  Chronic pain                       [facility] Adult PT Treatment/Exercise - 11/28/18 0001      Lumbar Exercises: Stretches   Other Lumbar Stretch Exercise  standing doorway stretch 5x     Other Lumbar Stretch Exercise  sidelying over pillow for QL stretch       Lumbar Exercises: Seated   Other Seated Lumbar Exercises  seated QL stretch on left       Moist Heat Therapy   Number Minutes Moist Heat  5 Minutes   concurrent with ex instruction    Moist Heat Location  Lumbar Spine      Manual Therapy   Joint  Mobilization  neutral spine gapping grade 3/4 5x ;    Soft tissue mobilization  left QL        Trigger Point Dry Needling - 11/28/18 0001    Consent Given?  Yes    Quadratus Lumborum Response  Palpable increased muscle length           PT Education - 11/28/18 1016    Education Details  Access Code: [email or web address]  QL stretches    Person(s) Educated  Patient    Methods  Explanation;Demonstration;Handout    Comprehension  Returned demonstration;Verbalized understanding       PT Short Term Goals - 11/17/18 0804      PT SHORT TERM GOAL #1   Title  be independent in initiation of HEP    Status  Achieved      PT SHORT TERM GOAL #2   Title  report a 30% reduction in LBP with standing and walking    Baseline  50-80% overall improvement      PT SHORT TERM GOAL #3  Title  play 9 holes of golf and report < or = to 4/10 LBP after    Baseline  played 18 holes with increased walking- 6/10, played a par 3 course- no pain    Status  Achieved      PT SHORT TERM GOAL #4   Title  walk for exercise with < or = to 3/10 LBP    Status  Achieved        PT Long Term Goals - 10/22/18 0951      PT LONG TERM GOAL #1   Title  be independent in advanced HEP    Time  8    Period  Weeks    Status  New    Target Date  12/17/18      PT LONG TERM GOAL #2   Title  reduce FOTO to < or = to 25% limitation    Time  8    Period  Weeks    Status  New    Target Date  12/17/18      PT LONG TERM GOAL #3   Title  walk for exercise without limitation due to LBP    Time  8    Period  Weeks    Status  New    Target Date  12/17/18      PT LONG TERM GOAL #4   Title  play a full round of golf and report < or = to 2/10 LBP after    Time  8    Period  Weeks    Status  New    Target Date  12/17/18      PT LONG TERM GOAL #5   Title  report < or = to 2/10 max LBP with standing to prepare a meal    Time  8    Period  Weeks    Status  New    Target Date  12/17/18            Plan - 11/28/18  [id number]    Clinical Impression Statement  The patient reports a significant improvement in hip/buttock pain with only left low back pain following playing golf.  Noted with rising with sit to stand.  Decreased muscle length noted in left quadratus lumborum muscle.  Discussed ex focus to incorporate muscle lengthening of this area following DN and manual therapy.  Patient demonstrates good compliance with HEP. Anticipate she will meet remaining goals in next 3-4 visits.    PT Frequency  2x / week    PT Duration  8 weeks    PT Treatment/Interventions  ADLs/Self Care Home Management;Electrical Stimulation;Moist Heat;Gait training;Neuromuscular re-education;Therapeutic exercise;Therapeutic activities;Functional mobility training;Patient/family education;Manual techniques;Taping;Dry needling;Spinal Manipulations;Joint Manipulations    PT Next Visit Plan  assess response to DN QL and manual therapy;  core and LE strengthening    PT Home Exercise Plan  [id number]       Patient will benefit from skilled therapeutic intervention in order to improve the following deficits and impairments:  Decreased activity tolerance, Difficulty walking, Impaired flexibility, Increased muscle spasms, Improper body mechanics  Visit Diagnosis: Cramp and spasm  Acute bilateral low back pain without sciatica     Problem List Patient Active Problem List   Diagnosis Date Noted  . Strain of muscle, fascia and tendon of lower back, initial encounter 10/16/2018   Ruben Im, PT 11/28/18 12:27 PM Phone: [phone] Fax: [phone]  Alvera Singh 11/28/2018, 12:26 PM  [facility] [address]  [address] [place], [place], [address] Phone: [phone]   Fax:  [phone]  Name: Kendra Norman MRN: [id number] Date of Birth: 1955-06-05

## 2018-12-02 ENCOUNTER — Ambulatory Visit: Payer: Medicare Other

## 2018-12-09 ENCOUNTER — Other Ambulatory Visit: Payer: Self-pay

## 2018-12-09 ENCOUNTER — Ambulatory Visit: Payer: Medicare Other

## 2018-12-09 DIAGNOSIS — M545 Low back pain, unspecified: Secondary | ICD-10-CM

## 2018-12-09 DIAGNOSIS — R252 Cramp and spasm: Secondary | ICD-10-CM

## 2018-12-09 NOTE — Therapy (Signed)
[facility] [address], [place], [place], [address] Phone: [phone]   Fax:  [phone]  Physical Therapy Treatment  Patient Details  Name: Kendra Norman MRN: [id number] Date of Birth: [birth year] Referring Provider (PT): Lilia Argue, MD   Encounter Date: 12/09/2018  PT End of Session - 12/09/18 0928    Visit Number  9    Date for PT Re-Evaluation  12/17/18    Authorization Type  Medicare/Tricare    PT Start Time  0844    PT Stop Time  0927    PT Time Calculation (min)  43 min    Activity Tolerance  Patient tolerated treatment well;No increased pain    Behavior During Therapy  WFL for tasks assessed/performed       Past Medical History:  Diagnosis Date  . Breast cancer ([place])   . GERD (gastroesophageal reflux disease)   . Seasonal allergies     Past Surgical History:  Procedure Laterality Date  . ABDOMINAL HYSTERECTOMY    . BREAST BIOPSY    . BREAST EXCISIONAL BIOPSY Bilateral   . VEIN SURGERY      There were no vitals filed for this visit.  Subjective Assessment - 12/09/18 0847    Subjective  I haven't been playing golf because it has been so wet.  I didn't get my exercises done this weekend becasue we had a family crisis.    Patient Stated Goals  reduce LBP, play golf without LBP after, walk without pain.    Currently in Pain?  Yes    Pain Score  2     Pain Location  Back    Pain Orientation  Left    Pain Descriptors / Indicators  Sharp    Pain Type  Chronic pain    Pain Onset  More than a month ago    Pain Frequency  Intermittent    Aggravating Factors   walking, standing too long    Pain Relieving Factors  heat/ice, Naproxen, biofreeze                       [facility] Adult PT Treatment/Exercise - 12/09/18 0001      Lumbar Exercises: Stretches   Other Lumbar Stretch Exercise  standing doorway stretch 5x       Lumbar Exercises: Aerobic   Nustep  L2 x8 min, PT present to  discuss golf return and ways to modify       Lumbar Exercises: Seated   Other Seated Lumbar Exercises  seated QL stretch on left       Manual Therapy   Manual Therapy  Soft tissue mobilization;Myofascial release    Joint Mobilization  elongation and trigger point release to gluteals and QL on the Lt    Soft tissue mobilization  left QL        Trigger Point Dry Needling - 12/09/18 0001    Consent Given?  Yes    Lumbar multifidi Response  Palpable increased muscle length;Twitch response elicited    Quadratus Lumborum Response  Palpable increased muscle length;Twitch response elicited           PT Education - 12/09/18 0858    Education Details  verbal review of all HEP- discussing how to modify and build a daily program    Person(s) Educated  Patient    Methods  Explanation    Comprehension  Verbalized understanding       PT Short Term Goals -  11/17/18 0804      PT SHORT TERM GOAL #1   Title  be independent in initiation of HEP    Status  Achieved      PT SHORT TERM GOAL #2   Title  report a 30% reduction in LBP with standing and walking    Baseline  50-80% overall improvement      PT SHORT TERM GOAL #3   Title  play 9 holes of golf and report < or = to 4/10 LBP after    Baseline  played 18 holes with increased walking- 6/10, played a par 3 course- no pain    Status  Achieved      PT SHORT TERM GOAL #4   Title  walk for exercise with < or = to 3/10 LBP    Status  Achieved        PT Long Term Goals - 10/22/18 0951      PT LONG TERM GOAL #1   Title  be independent in advanced HEP    Time  8    Period  Weeks    Status  New    Target Date  12/17/18      PT LONG TERM GOAL #2   Title  reduce FOTO to < or = to 25% limitation    Time  8    Period  Weeks    Status  New    Target Date  12/17/18      PT LONG TERM GOAL #3   Title  walk for exercise without limitation due to LBP    Time  8    Period  Weeks    Status  New    Target Date  12/17/18      PT  LONG TERM GOAL #4   Title  play a full round of golf and report < or = to 2/10 LBP after    Time  8    Period  Weeks    Status  New    Target Date  12/17/18      PT LONG TERM GOAL #5   Title  report < or = to 2/10 max LBP with standing to prepare a meal    Time  8    Period  Weeks    Status  New    Target Date  12/17/18            Plan - 12/09/18 0904    Clinical Impression Statement  Pt reports 70% overall improvement in Lt lumbar and gluteal symptoms since the start of care.  Part of session was spent discussing current HEP, purpose of each exercise and how to build a daily routine to incorporate both strength and flexibility.  Pt with tension and trigger points over Lt quadratus and lumbar spine and demonstrated improved tissue mobility after manual and dry needling today.  Pt will attend 1 more session this week and would like to be placed on hold until 12.11.2020 and if she doesn't return will be discharged.    Rehab Potential  Excellent    PT Frequency  2x / week    PT Duration  8 weeks    PT Treatment/Interventions  ADLs/Self Care Home Management;Electrical Stimulation;Moist Heat;Gait training;Neuromuscular re-education;Therapeutic exercise;Therapeutic activities;Functional mobility training;Patient/family education;Manual techniques;Taping;Dry needling;Spinal Manipulations;Joint Manipulations    PT Next Visit Plan  1 more session, do 10th visit summary.  Do FOTO and assess goals.  Place on hold until 01/02/2019.  See how pt felt after playing  golf this week    PT Home Exercise Plan  [id number]       Patient will benefit from skilled therapeutic intervention in order to improve the following deficits and impairments:  Decreased activity tolerance, Difficulty walking, Impaired flexibility, Increased muscle spasms, Improper body mechanics  Visit Diagnosis: Acute bilateral low back pain without sciatica  Cramp and spasm     Problem List Patient Active Problem List    Diagnosis Date Noted  . Strain of muscle, fascia and tendon of lower back, initial encounter 10/16/2018    Sigurd Sos, PT 12/09/18 9:31 AM  [facility] [address], [place], [place], [address] Phone: [phone]   Fax:  [phone]  Name: Kendra Norman MRN: [id number] Date of Birth: 09-20-1955

## 2018-12-11 ENCOUNTER — Other Ambulatory Visit: Payer: Self-pay

## 2018-12-11 ENCOUNTER — Ambulatory Visit: Payer: Medicare Other | Admitting: Physical Therapy

## 2018-12-11 DIAGNOSIS — R252 Cramp and spasm: Secondary | ICD-10-CM | POA: Diagnosis not present

## 2018-12-11 DIAGNOSIS — M545 Low back pain, unspecified: Secondary | ICD-10-CM

## 2018-12-11 NOTE — Patient Instructions (Signed)
Access Code: [email or web address]  URL: [email or web address]  Date: 12/11/2018  Prepared by: Sherol Dade   Exercises  Standing ITB Stretch - 3 reps - 1 sets - 30 hold - 1x daily - 7x weekly  Seated Piriformis Stretch - 3 reps - 1 sets - 30 hold - 1x daily - 7x weekly  Seated Hamstring Stretch - 3 reps - 1 sets - 30 hold - 1x daily - 7x weekly  Clamshell with Resistance - 10 reps - 1 sets - 1x daily - 7x weekly  Bird Dog - 10 reps - 1 sets - 1x daily - 7x weekly  Child's Pose with Sidebending - 3 reps - 1 sets - 30 hold - 1x daily - 7x weekly  Doorway Hip Flexor Stretch with Chair - 5 reps - 6 sets - 1x daily - 7x weekly  Single Leg Deadlift with Kettlebell - 10 reps - 2 sets - 1x daily - 7x weekly  Sidelying Thoracic and Shoulder Rotation - 10 reps - 1 sets - 1x daily - 7x weekly  Seated Quadratus Lumborum Stretch with Arm Overhead - 3 reps - 1 sets - 30 hold - 1x daily - 7x weekly  Quadratus Lumborum Stretch with Noodle Overhead - 5 reps - 1 sets - 30 hold - 1x daily - 7x weekly    [facility] [address] [place], [place] [address] Phone # [phone] Fax [phone]

## 2018-12-11 NOTE — Therapy (Addendum)
[facility] [address] [place], [place], [address] Phone: [phone]   Fax:  [phone]  Physical Therapy Treatment  Patient Details  Name: Kendra Norman MRN: [id number] Date of Birth: 1955/08/08 Referring Provider (PT): Lilia Argue, MD  Progress Note Reporting Period 10/22/18 to 12/11/18  See note below for Objective Data and Assessment of Progress/Goals.       Encounter Date: 12/11/2018  PT End of Session - 12/11/18 1621    Visit Number  10    Date for PT Re-Evaluation  12/17/18    Authorization Type  Medicare/Tricare    PT Start Time  1617    PT Stop Time  1656    PT Time Calculation (min)  39 min    Activity Tolerance  Patient tolerated treatment well;No increased pain    Behavior During Therapy  WFL for tasks assessed/performed       Past Medical History:  Diagnosis Date  . Breast cancer ([place])   . GERD (gastroesophageal reflux disease)   . Seasonal allergies     Past Surgical History:  Procedure Laterality Date  . ABDOMINAL HYSTERECTOMY    . BREAST BIOPSY    . BREAST EXCISIONAL BIOPSY Bilateral   . VEIN SURGERY      There were no vitals filed for this visit.      [facility] PT Assessment - 12/11/18 0001      Assessment   Medical Diagnosis  strain of lumbar region    Referring Provider (PT)  Lilia Argue, MD    Onset Date/Surgical Date  09/21/18      Precautions   Precautions  None      Balance Screen   Has the patient fallen in the past 6 months  No    Has the patient had a decrease in activity level because of a fear of falling?   No    Is the patient reluctant to leave their home because of a fear of falling?   No      Observation/Other Assessments   Focus on Therapeutic Outcomes (FOTO)   18% limitation                   [facility] Adult PT Treatment/Exercise - 12/11/18 0001      Lumbar Exercises: Stretches   Other Lumbar Stretch Exercise  review of quadratus  lumborumm stretch in taylor sitting and standing      Lumbar Exercises: Standing   Other Standing Lumbar Exercises  trunk rotation with diagonal lift x10 reps each direction for HEP demo      Knee/Hip Exercises: Standing   Other Standing Knee Exercises  single leg deadlift with 1 UE support, HEP demo x5 reps     Other Standing Knee Exercises  power tower: lunge with opposite UE row x10 reps each side       Manual Therapy   Soft tissue mobilization  STM and trigger point release distal QL             PT Education - 12/11/18 1713    Education Details  reviewed and updated HEP    Person(s) Educated  Patient    Methods  Explanation;Handout    Comprehension  Verbalized understanding       PT Short Term Goals - 12/11/18 1626      PT SHORT TERM GOAL #1   Title  be independent in initiation of HEP    Status  Achieved  PT SHORT TERM GOAL #2   Title  report a 30% reduction in LBP with standing and walking    Baseline  80% overall improvement    Status  Achieved      PT SHORT TERM GOAL #3   Title  play 9 holes of golf and report < or = to 4/10 LBP after    Baseline  18 holes with 3/10 pain    Status  Achieved      PT SHORT TERM GOAL #4   Title  walk for exercise with < or = to 3/10 LBP    Status  Achieved        PT Long Term Goals - 12/11/18 1627      PT LONG TERM GOAL #1   Title  be independent in advanced HEP    Time  8    Period  Weeks    Status  Achieved      PT LONG TERM GOAL #2   Title  reduce FOTO to < or = to 25% limitation    Baseline  18% limited    Time  8    Period  Weeks    Status  Achieved      PT LONG TERM GOAL #3   Title  walk for exercise without limitation due to LBP    Time  8    Period  Weeks    Status  Achieved      PT LONG TERM GOAL #4   Title  play a full round of golf and report < or = to 2/10 LBP after    Baseline  3/10 pain    Time  8    Period  Weeks    Status  Partially Met      PT LONG TERM GOAL #5   Title  report <  or = to 2/10 max LBP with standing to prepare a meal    Time  8    Period  Weeks    Status  Achieved            Plan - 12/11/18 1656    Clinical Impression Statement  Pt has made great progress towards her goals since beginning PT. She was able to play 18 holes of golf with no more than 3/10 Lt sided low back discomfort afterwards. She has met nearly all of her short- and long-term goals and her FOTO score has improved to 18% limitation. She is consistently completing her HEP without any reported issues and has been able to return to her regular walking and other daily activity without limitation. She has some limitations in rotary trunk strength and stability likely contributing to her Lt sided low back pain following a golf match, however this will likely continue to improve with further work on her HEP moving forward. Pt would like to be placed on hold and discharged in 3-4 weeks assuming no further issues arise.    Rehab Potential  Excellent    PT Frequency  2x / week    PT Duration  8 weeks    PT Treatment/Interventions  ADLs/Self Care Home Management;Electrical Stimulation;Moist Heat;Gait training;Neuromuscular re-education;Therapeutic exercise;Therapeutic activities;Functional mobility training;Patient/family education;Manual techniques;Taping;Dry needling;Spinal Manipulations;Joint Manipulations    PT Next Visit Plan  Place on hold until 01/02/2019.    PT Home Exercise Plan  [id number]       Patient will benefit from skilled therapeutic intervention in order to improve the following deficits and impairments:  Decreased activity tolerance, Difficulty walking,  Impaired flexibility, Increased muscle spasms, Improper body mechanics  Visit Diagnosis: Acute bilateral low back pain without sciatica  Cramp and spasm     Problem List Patient Active Problem List   Diagnosis Date Noted  . Strain of muscle, fascia and tendon of lower back, initial encounter 10/16/2018    5:17  PM,12/11/18 Sherol Dade PT, DPT [place] [facility] [place] PHYSICAL THERAPY DISCHARGE SUMMARY  Visits from Start of Care: 10   Current functional level related to goals / functional outcomes: See above for current status.  Pt met all goals and will be discharged to HEP for further gains.     Remaining deficits: No significant deficits remain at this time.     Education / Equipment: HEP, posture/body mechanics Plan: Patient agrees to discharge.  Patient goals were met. Patient is being discharged due to meeting the stated rehab goals.  ?????        Sigurd Sos, PT 01/05/19 10:52 AM  [facility] [address] [place], [place], [address] Phone: [phone]   Fax:  [phone]  Name: Kendra Norman MRN: [id number] Date of Birth: 10-04-1955

## 2019-03-30 ENCOUNTER — Other Ambulatory Visit: Payer: Self-pay | Admitting: Family Medicine

## 2019-03-30 DIAGNOSIS — Z1231 Encounter for screening mammogram for malignant neoplasm of breast: Secondary | ICD-10-CM

## 2019-04-08 ENCOUNTER — Other Ambulatory Visit: Payer: Self-pay

## 2019-04-08 ENCOUNTER — Other Ambulatory Visit: Payer: Self-pay | Admitting: Family Medicine

## 2019-04-08 ENCOUNTER — Ambulatory Visit
Admission: RE | Admit: 2019-04-08 | Discharge: 2019-04-08 | Disposition: A | Discharge status: Home / Self Care | Payer: Medicare Other | Source: Ambulatory Visit

## 2019-04-08 DIAGNOSIS — Z1231 Encounter for screening mammogram for malignant neoplasm of breast: Secondary | ICD-10-CM

## 2019-07-17 ENCOUNTER — Ambulatory Visit (HOSPITAL_COMMUNITY)
Admission: RE | Admit: 2019-07-17 | Discharge: 2019-07-17 | Disposition: A | Discharge status: Home / Self Care | Payer: Medicare Other | Source: Ambulatory Visit | Attending: Emergency Medicine | Admitting: Emergency Medicine

## 2019-07-17 ENCOUNTER — Other Ambulatory Visit: Payer: Self-pay

## 2019-07-17 ENCOUNTER — Encounter (HOSPITAL_COMMUNITY): Payer: Self-pay

## 2019-07-17 VITALS — BP 126/80 | HR 80 | Temp 98.0°F | Resp 16

## 2019-07-17 DIAGNOSIS — L249 Irritant contact dermatitis, unspecified cause: Secondary | ICD-10-CM

## 2019-07-17 MED ORDER — PREDNISONE 10 MG PO TABS: 10.0000 mg | ORAL_TABLET | ORAL | 0 refills | Status: DC

## 2019-07-17 MED ORDER — TRIAMCINOLONE ACETONIDE 0.5 % EX OINT: 1.0000 "application " | TOPICAL_OINTMENT | Freq: Two times a day (BID) | CUTANEOUS | 0 refills | Status: DC

## 2019-07-17 MED ORDER — HYDROXYZINE PAMOATE 25 MG PO CAPS: 25.0000 mg | ORAL_CAPSULE | Freq: Three times a day (TID) | ORAL | 0 refills | Status: DC | PRN

## 2019-07-17 NOTE — ED Triage Notes (Signed)
Pt c/o rash x 1 week she states it started on the right side and continually spread across her chest. Pt denies any changes in hygiene or hygiene products. Pt states she has used 1% hydrocortisone cream with no relief.

## 2019-07-17 NOTE — Discharge Instructions (Signed)
Take steroid as directed with breakfast as discussed: 6-5-4-3-2-1 Vistaril at bedtime as needed. Follow up with PCP next week. Return sooner for worsening rash, pain, fever.

## 2019-07-17 NOTE — ED Provider Notes (Signed)
[facility]    CSN: [id number] Arrival date & time: 07/17/19  [place]      History   Chief Complaint Chief Complaint  Patient presents with  . Appointment  . Rash    HPI Kendra Norman is a 64 y.o. female with history of GERD, seasonal allergies presenting for weeklong course of pruritic rash.  Patient states it started on her right side after working outside, then has spread over her trunk.  Denies face, genital involvement.  Has used 1% hydrocortisone without relief.  Does not use Benadryl due to ADR, though has tolerated Vistaril well in the past.  No fever, arthralgias, myalgias, difficulty breathing or swallowing, chest pain, abdominal pain, vomiting, diarrhea.   Past Medical History:  Diagnosis Date  . GERD (gastroesophageal reflux disease)   . Seasonal allergies     Patient Active Problem List   Diagnosis Date Noted  . Strain of muscle, fascia and tendon of lower back, initial encounter 10/16/2018    Past Surgical History:  Procedure Laterality Date  . ABDOMINAL HYSTERECTOMY    . BREAST BIOPSY    . BREAST EXCISIONAL BIOPSY Bilateral    fibroadenomas - benign  . BREAST LUMPECTOMY Bilateral    fibroadenomas - benign  . VEIN SURGERY      OB History   No obstetric history on file.      Home Medications    Prior to Admission medications   Medication Sig Start Date End Date Taking? Authorizing Provider  aspirin 81 MG tablet Take 81 mg by mouth daily.    [provider]  calcium-vitamin D (CALCIUM 500/D) 500-200 MG-UNIT tablet Take by mouth.    [provider]  docusate sodium (COLACE) 100 MG capsule Take 100 mg by mouth as needed for mild constipation.    [provider]  estrogen-methylTESTOSTERone (ESTRATEST HS) 0.625-1.25 MG per tablet Take 1 tablet by mouth daily.    [provider]  fluticasone (FLONASE) 50 MCG/ACT nasal spray Place 1 spray into both nostrils daily.    [provider]    glucosamine-chondroitin (GLUCOSAMINE-CHONDROITIN DS) 500-400 MG tablet Take by mouth.    [provider]  HYDROcodone-acetaminophen (NORCO) 10-325 MG tablet Take 1 tablet by mouth every 4 (four) hours as needed.    Wallene Huh, DPM  hydrOXYzine (VISTARIL) 25 MG capsule Take 1 capsule (25 mg total) by mouth 3 (three) times daily as needed. 07/17/19   Hall-Potvin, [place], PA-C  loratadine (CLARITIN) 10 MG tablet Take 10 mg by mouth daily.    [provider]  Magnesium 250 MG TABS Take by mouth.    [provider]  Multiple Vitamin (MULTIVITAMIN) tablet Take by mouth.    [provider]  naproxen (NAPROSYN) 500 MG tablet Take 1 tablet (500 mg total) by mouth 2 (two) times daily with a meal. 10/27/11   Moreno-Coll, Adlih, MD  omeprazole (PRILOSEC) 10 MG capsule Take 10 mg by mouth daily.    [provider]  polyethylene glycol powder (MIRALAX) powder Take 1 Container by mouth once. Pt takes 1 tablespoon as needed    [provider]  predniSONE (DELTASONE) 10 MG tablet Take 1 tablet (10 mg total) by mouth as directed. Take taper as discussed 07/17/19   Hall-Potvin, [place], PA-C  psyllium (METAMUCIL) 58.6 % powder Take 1 packet by mouth 3 (three) times daily. Pt takes 2 tablespoon daily    [provider]  triamcinolone ointment (KENALOG) 0.5 % Apply 1 application topically 2 (  two) times daily. 07/17/19   Hall-Potvin, [place], PA-C  vitamin C (ASCORBIC ACID) 500 MG tablet Take 500 mg by mouth daily.    [provider]    Family History History reviewed. No pertinent family history.  Social History Social History   Tobacco Use  . Smoking status: Never Smoker  . Smokeless tobacco: Never Used  Vaping Use  . Vaping Use: Never used  Substance Use Topics  . Alcohol use: Yes    Alcohol/week: 0.0 standard drinks    Comment: occasionally  . Drug use: No     Allergies   Patient has no known allergies.   Review of  Systems As per HPI   Physical Exam Triage Vital Signs ED Triage Vitals  Enc Vitals Group     BP 07/17/19 1902 126/80     Pulse Rate 07/17/19 1902 80     Resp 07/17/19 1902 16     Temp 07/17/19 1902 98 F (36.7 C)     Temp Source 07/17/19 1902 Oral     SpO2 07/17/19 1902 99 %     Weight --      Height --      Head Circumference --      Peak Flow --      Pain Score 07/17/19 1901 0     Pain Loc --      Pain Edu? --      Excl. in [place]? --    No data found.  Updated Vital Signs BP 126/80 (BP Location: Right Arm)   Pulse 80   Temp 98 F (36.7 C) (Oral)   Resp 16   SpO2 99%   Visual Acuity Right Eye Distance:   Left Eye Distance:   Bilateral Distance:    Right Eye Near:   Left Eye Near:    Bilateral Near:     Physical Exam Constitutional:      General: She is not in acute distress. HENT:     Head: Normocephalic and atraumatic.  Eyes:     General: No scleral icterus.    Pupils: Pupils are equal, round, and reactive to light.  Cardiovascular:     Rate and Rhythm: Normal rate.  Pulmonary:     Effort: Pulmonary effort is normal.  Skin:    Coloration: Skin is not jaundiced or pale.     Findings: Rash present.     Comments: Scattered erythematous rash with linear excoriations over right side, left chest under breast, right abdomen.  No open wounds, vesicular lesions, tenderness.  Neurological:     Mental Status: She is alert and oriented to person, place, and time.      UC Treatments / Results  Labs (all labs ordered are listed, but only abnormal results are displayed) Labs Reviewed - No data to display  EKG   Radiology No results found.  Procedures Procedures (including critical care time)  Medications Ordered in UC Medications - No data to display  Initial Impression / Assessment and Plan / UC Course  I have reviewed the triage vital signs and the nursing notes.  Pertinent labs & imaging results that were available during my care of the patient  were reviewed by me and considered in my medical decision making (see chart for details).     Patient appears well in office: H&P consistent with irritant dermatitis, likely plant given recent outdoor history.  Treat supportively as outlined below and have patient follow-up with PCP next week.  Return precautions discussed, patient verbalized  understanding and is agreeable to plan. Final Clinical Impressions(s) / UC Diagnoses   Final diagnoses:  Irritant contact dermatitis, unspecified trigger     Discharge Instructions     Take steroid as directed with breakfast as discussed: 6-5-4-3-2-1 Vistaril at bedtime as needed. Follow up with PCP next week. Return sooner for worsening rash, pain, fever.    ED Prescriptions    Medication Sig Dispense Auth. Provider   predniSONE (DELTASONE) 10 MG tablet Take 1 tablet (10 mg total) by mouth as directed. Take taper as discussed 21 tablet Hall-Potvin, [place], PA-C   hydrOXYzine (VISTARIL) 25 MG capsule Take 1 capsule (25 mg total) by mouth 3 (three) times daily as needed. 30 capsule Hall-Potvin, [place], PA-C   triamcinolone ointment (KENALOG) 0.5 % Apply 1 application topically 2 (two) times daily. 30 g Hall-Potvin, [place], PA-C     PDMP not reviewed this encounter.   Hall-Potvin, [place], [place] 07/17/19 2116

## 2019-12-28 ENCOUNTER — Ambulatory Visit (INDEPENDENT_AMBULATORY_CARE_PROVIDER_SITE_OTHER): Payer: Medicare Other | Admitting: Psychology

## 2019-12-28 DIAGNOSIS — F4321 Adjustment disorder with depressed mood: Secondary | ICD-10-CM

## 2020-01-11 ENCOUNTER — Ambulatory Visit: Payer: Medicare Other | Admitting: Psychology

## 2020-01-27 ENCOUNTER — Ambulatory Visit: Payer: Medicare Other | Admitting: Psychology

## 2020-02-10 ENCOUNTER — Ambulatory Visit (INDEPENDENT_AMBULATORY_CARE_PROVIDER_SITE_OTHER): Payer: Medicare Other | Admitting: Psychology

## 2020-02-10 DIAGNOSIS — F4321 Adjustment disorder with depressed mood: Secondary | ICD-10-CM | POA: Diagnosis not present

## 2020-02-22 ENCOUNTER — Ambulatory Visit (INDEPENDENT_AMBULATORY_CARE_PROVIDER_SITE_OTHER): Payer: Medicare Other | Admitting: Psychology

## 2020-02-22 DIAGNOSIS — F4321 Adjustment disorder with depressed mood: Secondary | ICD-10-CM | POA: Diagnosis not present

## 2020-03-14 ENCOUNTER — Other Ambulatory Visit: Payer: Self-pay | Admitting: Family Medicine

## 2020-03-14 ENCOUNTER — Ambulatory Visit (INDEPENDENT_AMBULATORY_CARE_PROVIDER_SITE_OTHER): Payer: Medicare Other | Admitting: Psychology

## 2020-03-14 DIAGNOSIS — Z1231 Encounter for screening mammogram for malignant neoplasm of breast: Secondary | ICD-10-CM

## 2020-03-14 DIAGNOSIS — F4321 Adjustment disorder with depressed mood: Secondary | ICD-10-CM | POA: Diagnosis not present

## 2020-03-28 ENCOUNTER — Ambulatory Visit (INDEPENDENT_AMBULATORY_CARE_PROVIDER_SITE_OTHER): Payer: Medicare Other | Admitting: Psychology

## 2020-03-28 DIAGNOSIS — F4321 Adjustment disorder with depressed mood: Secondary | ICD-10-CM | POA: Diagnosis not present

## 2020-04-12 ENCOUNTER — Ambulatory Visit: Payer: Medicare Other | Admitting: Psychology

## 2020-04-18 ENCOUNTER — Other Ambulatory Visit: Payer: Self-pay

## 2020-04-18 ENCOUNTER — Ambulatory Visit
Admission: RE | Admit: 2020-04-18 | Discharge: 2020-04-18 | Disposition: A | Discharge status: Home / Self Care | Payer: Medicare Other | Source: Ambulatory Visit

## 2020-04-18 DIAGNOSIS — Z1231 Encounter for screening mammogram for malignant neoplasm of breast: Secondary | ICD-10-CM

## 2020-04-25 ENCOUNTER — Ambulatory Visit (INDEPENDENT_AMBULATORY_CARE_PROVIDER_SITE_OTHER): Payer: Medicare Other | Admitting: Psychology

## 2020-04-25 DIAGNOSIS — F4321 Adjustment disorder with depressed mood: Secondary | ICD-10-CM

## 2020-05-09 ENCOUNTER — Ambulatory Visit (INDEPENDENT_AMBULATORY_CARE_PROVIDER_SITE_OTHER): Payer: Medicare Other | Admitting: Psychology

## 2020-05-09 DIAGNOSIS — F4321 Adjustment disorder with depressed mood: Secondary | ICD-10-CM | POA: Diagnosis not present

## 2020-05-27 ENCOUNTER — Ambulatory Visit: Payer: Medicare Other | Admitting: Psychology

## 2020-07-04 ENCOUNTER — Ambulatory Visit: Payer: Medicare Other | Admitting: Dermatology

## 2020-07-13 ENCOUNTER — Ambulatory Visit (INDEPENDENT_AMBULATORY_CARE_PROVIDER_SITE_OTHER): Payer: Medicare Other | Admitting: Dermatology

## 2020-07-13 ENCOUNTER — Other Ambulatory Visit: Payer: Self-pay

## 2020-07-13 ENCOUNTER — Encounter: Payer: Self-pay | Admitting: Dermatology

## 2020-07-13 DIAGNOSIS — D1801 Hemangioma of skin and subcutaneous tissue: Secondary | ICD-10-CM

## 2020-07-13 DIAGNOSIS — L570 Actinic keratosis: Secondary | ICD-10-CM

## 2020-07-13 DIAGNOSIS — L821 Other seborrheic keratosis: Secondary | ICD-10-CM | POA: Diagnosis not present

## 2020-07-13 DIAGNOSIS — L738 Other specified follicular disorders: Secondary | ICD-10-CM | POA: Diagnosis not present

## 2020-07-13 DIAGNOSIS — Z1283 Encounter for screening for malignant neoplasm of skin: Secondary | ICD-10-CM | POA: Diagnosis not present

## 2020-07-24 ENCOUNTER — Encounter: Payer: Self-pay | Admitting: Dermatology

## 2020-07-24 NOTE — Progress Notes (Signed)
   Follow-Up Visit   Subjective  Kendra Norman is a 65 y.o. female who presents for the following: Annual Exam (No new concerns no h/o of irregular lesions in paper chart/).  Annual skin check, several new brown spots Location:  Duration:  Quality:  Associated Signs/Symptoms: Modifying Factors:  Severity:  Timing: Context:   Objective  Well appearing patient in no apparent distress; mood and affect are within normal limits. Left Areola, Mid Back, Right Inframammary Fold Light brown 3 to 7 mm textured flattopped papules  Abdomen (Lower Torso, Anterior) Dozens of 1 mm red vascular lesions  Head to Toe Head to toe exam today. No signs of atypical moles, melanoma or non mole skin cancer.   Mid Forehead Two millimeter cream-colored papules with tiny umbilication  Right Forearm - Posterior Four millimeter hornlike pink crust    A full examination was performed including scalp, head, eyes, ears, nose, lips, neck, chest, axillae, abdomen, back, buttocks, bilateral upper extremities, bilateral lower extremities, hands, feet, fingers, toes, fingernails, and toenails. All findings within normal limits unless otherwise noted below.  Areas beneath undergarments not fully examined.   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 so if there is growth or bleeding return for biopsy  AK (actinic keratosis) Right Forearm - Posterior  Destruction of lesion - Right Forearm - Posterior Complexity: simple   Destruction method: cryotherapy   Informed consent: discussed and consent obtained   Timeout:  patient name, date of birth, surgical site, and procedure verified Lesion destroyed using  liquid nitrogen: Yes   Cryotherapy cycles:  4 Outcome: patient tolerated procedure well with no complications   Post-procedure details: wound care instructions given        I, Kendra Monarch, MD, have reviewed all documentation for this visit.  The documentation on 07/24/20 for the exam, diagnosis, procedures, and orders are all accurate and complete.

## 2020-08-12 ENCOUNTER — Ambulatory Visit: Payer: Medicare Other

## 2020-08-16 ENCOUNTER — Ambulatory Visit: Payer: Medicare Other

## 2020-08-17 ENCOUNTER — Ambulatory Visit: Payer: Medicare Other

## 2020-08-22 ENCOUNTER — Ambulatory Visit: Payer: Self-pay | Admitting: *Deleted

## 2020-08-22 ENCOUNTER — Ambulatory Visit: Payer: Medicare Other

## 2020-08-22 ENCOUNTER — Other Ambulatory Visit (HOSPITAL_BASED_OUTPATIENT_CLINIC_OR_DEPARTMENT_OTHER): Payer: Self-pay

## 2020-08-22 NOTE — Telephone Encounter (Signed)
See triage note.

## 2020-08-22 NOTE — Telephone Encounter (Signed)
Reason for Disposition  Health Information question, no triage required and triager able to answer question  Answer Assessment - Initial Assessment Questions 1. REASON FOR CALL or QUESTION: "What is your reason for calling today?" or "How can I best help you?" or "What question do you have that I can help answer?"     I had an antibody infusion 7 days ago for Covid.    I'm scheduled for a Covid vaccine booster today.   Is it too soon?  I let her know I believe she needed to wait 90 days however I gave her the phone number to the infusion clinic information line [phone].  She thanked me very much for my help.  Protocols used: Information Only Call - No Triage-A-AH

## 2020-08-31 ENCOUNTER — Other Ambulatory Visit (HOSPITAL_BASED_OUTPATIENT_CLINIC_OR_DEPARTMENT_OTHER): Payer: Self-pay

## 2020-08-31 ENCOUNTER — Ambulatory Visit: Payer: Medicare Other | Attending: Internal Medicine

## 2020-08-31 DIAGNOSIS — Z23 Encounter for immunization: Secondary | ICD-10-CM

## 2020-08-31 MED ORDER — PFIZER-BIONT COVID-19 VAC-TRIS 30 MCG/0.3ML IM SUSP
INTRAMUSCULAR | 0 refills | Status: DC
  Filled 2020-08-31: qty 0.3, 1d supply, fill #0

## 2020-08-31 NOTE — Progress Notes (Signed)
   Covid-19 Vaccination Clinic  Name:  Kendra Norman    MRN: [id number] DOB: Jun 10, 1955  08/31/2020  [name] was observed post Covid-19 immunization for 15 minutes without incident. She was provided with Vaccine Information Sheet and instruction to access the V-Safe system.   [name] was instructed to call 911 with any severe reactions post vaccine: Difficulty breathing  Swelling of face and throat  A fast heartbeat  A bad rash all over body  Dizziness and weakness   Immunizations Administered     Name Date Dose VIS Date Route   PFIZER Comrnaty(Gray TOP) Covid-19 Vaccine 08/31/2020  9:44 AM 0.3 mL 12/31/2019 Intramuscular   Manufacturer: [place]   Lot: [id number]   NDC: [phone]

## 2020-12-21 ENCOUNTER — Other Ambulatory Visit (HOSPITAL_BASED_OUTPATIENT_CLINIC_OR_DEPARTMENT_OTHER): Payer: Self-pay

## 2020-12-21 MED ORDER — ZOSTER VAC RECOMB ADJUVANTED 50 MCG/0.5ML IM SUSR
INTRAMUSCULAR | 0 refills | Status: DC
  Filled 2020-12-21 – 2021-11-16 (×4): qty 0.5, 1d supply, fill #0

## 2021-03-01 ENCOUNTER — Other Ambulatory Visit: Payer: Self-pay

## 2021-03-01 ENCOUNTER — Ambulatory Visit (INDEPENDENT_AMBULATORY_CARE_PROVIDER_SITE_OTHER): Payer: Medicare Other

## 2021-03-01 ENCOUNTER — Ambulatory Visit (INDEPENDENT_AMBULATORY_CARE_PROVIDER_SITE_OTHER): Payer: Medicare Other | Admitting: Podiatry

## 2021-03-01 ENCOUNTER — Encounter: Payer: Self-pay | Admitting: Podiatry

## 2021-03-01 DIAGNOSIS — M722 Plantar fascial fibromatosis: Secondary | ICD-10-CM | POA: Diagnosis not present

## 2021-03-01 DIAGNOSIS — M7661 Achilles tendinitis, right leg: Secondary | ICD-10-CM

## 2021-03-01 MED ORDER — TRIAMCINOLONE ACETONIDE 10 MG/ML IJ SUSP
10.0000 mg | Freq: Once | INTRAMUSCULAR | Status: AC
  Administered 2021-03-01: 10 mg

## 2021-03-01 NOTE — Patient Instructions (Signed)

## 2021-03-01 NOTE — Progress Notes (Addendum)
Subjective:   Patient ID: Kendra Norman, female   DOB: 66 y.o.   MRN: [id number]   HPI Patient presents stating she has had acute pain in the posterior medial heel right of approximate 6 months duration which has gotten worse recently.  States it is hard for her to be active or walk and she did have an osteotomy by me about 4 to 5 years ago which has done well.  Patient does not smoke likes to be active   Review of Systems  All other systems reviewed and are negative.      Objective:  Physical Exam Vitals and nursing note reviewed.  Constitutional:      Appearance: She is well-developed.  Pulmonary:     Effort: Pulmonary effort is normal.  Musculoskeletal:        General: Normal range of motion.  Skin:    General: Skin is warm.  Neurological:     Mental Status: She is alert.    Neurovascular status intact muscle strength adequate range of motion adequate with patient found to have acute discomfort posterior aspect right heel at the insertional point of the tendon into the calcaneus with inflammation fluid around the medial band.  Moderate depression of the arch is noted and good digital perfusion well oriented x3     Assessment:  Acute Achilles tendinitis right with inflammation fluid of the medial band     Plan:  H&P reviewed condition and today did sterile prep and injected the medial side right Achilles 3 mg Kenalog 5 mg Xylocaine applied air fracture walker and before doing procedure did discuss possibilities for rupture associated with steroid injection gave instructions of the physical therapy support therapy and she will gear modifications and reappoint to recheck  X-rays indicate moderate depression of the arch plantar spur formation at insertion no indication of stress fracture

## 2021-03-16 ENCOUNTER — Telehealth: Payer: Self-pay | Admitting: *Deleted

## 2021-03-16 NOTE — Telephone Encounter (Signed)
Patient is calling because her heel is still in a lot of pain at times and having pain w/ exercises,was supposed to come out of boot in two weeks and has a f/u in March, should she stay in boot until her appointment? Please advise

## 2021-03-16 NOTE — Telephone Encounter (Signed)
Yes, at least part time.

## 2021-03-16 NOTE — Telephone Encounter (Signed)
Patient notified

## 2021-03-20 ENCOUNTER — Other Ambulatory Visit: Payer: Self-pay | Admitting: Family Medicine

## 2021-03-20 DIAGNOSIS — Z1231 Encounter for screening mammogram for malignant neoplasm of breast: Secondary | ICD-10-CM

## 2021-03-30 ENCOUNTER — Encounter: Payer: Self-pay | Admitting: Podiatry

## 2021-03-30 ENCOUNTER — Ambulatory Visit (INDEPENDENT_AMBULATORY_CARE_PROVIDER_SITE_OTHER): Payer: Medicare Other | Admitting: Podiatry

## 2021-03-30 ENCOUNTER — Other Ambulatory Visit: Payer: Self-pay

## 2021-03-30 DIAGNOSIS — M7661 Achilles tendinitis, right leg: Secondary | ICD-10-CM

## 2021-03-30 DIAGNOSIS — M722 Plantar fascial fibromatosis: Secondary | ICD-10-CM

## 2021-03-30 MED ORDER — TRIAMCINOLONE ACETONIDE 10 MG/ML IJ SUSP: 10.0000 mg | Freq: Once | INTRAMUSCULAR | Status: DC

## 2021-03-30 NOTE — Progress Notes (Signed)
Subjective:  ? ?Patient ID: Kendra Norman, female   DOB: 66 y.o.   MRN: [id number]  ? ?HPI ?Patient presents stating the area that you worked on seems to be improved but I am having pain in the outside of the heel now and I am wearing the boot that does give me some relief but still have quite a bit of discomfort ? ? ?ROS ? ? ?   ?Objective:  ?Physical Exam  ?Neuro vascular status intact with improvement on the inside of the Achilles tendon right with pain now centered on the outside portion of the band lateral and mild fluid within this area ? ?   ?Assessment:  ?Improvement medial Achilles tendinitis with discomfort lateral side of the Achilles ? ?   ?Plan:  ?Reviewed and I do think we could consider a careful lateral injection and patient completely understands chances for rupture associated with injection and I went ahead did sterile prep and injected the lateral side 3 mg dexamethasone Kenalog 5 mg Xylocaine and continue boot usage ice therapy.  We discussed physical therapy and if symptoms or not improved in the next 3 to 4 weeks patient will send me a note and I will schedule her for physical therapy and I did discuss possibilities for future PRP injections or possible shockwave therapy if symptoms do not improve ?   ? ? ?

## 2021-04-18 ENCOUNTER — Encounter: Payer: Self-pay | Admitting: Podiatry

## 2021-04-19 ENCOUNTER — Ambulatory Visit
Admission: RE | Admit: 2021-04-19 | Discharge: 2021-04-19 | Disposition: A | Discharge status: Home / Self Care | Payer: Medicare Other | Source: Ambulatory Visit | Attending: Family Medicine | Admitting: Family Medicine

## 2021-04-19 ENCOUNTER — Other Ambulatory Visit: Payer: Self-pay

## 2021-04-19 DIAGNOSIS — Z1231 Encounter for screening mammogram for malignant neoplasm of breast: Secondary | ICD-10-CM

## 2021-04-20 ENCOUNTER — Telehealth: Payer: Self-pay | Admitting: Podiatry

## 2021-04-20 NOTE — Telephone Encounter (Signed)
Pt was giving an insert for her shoe to elevate her heel and she wants to know if she could stop by today to pick up a couple more to have on hand. She states it really helps and she wants to be sure she has some on hand.  ? ?Please advise. ?

## 2021-04-21 NOTE — Telephone Encounter (Signed)
yes

## 2021-04-21 NOTE — Telephone Encounter (Signed)
Left voice message for patient to stop by to pick up extra inserts for elevation of heel. ?

## 2021-04-25 ENCOUNTER — Telehealth: Payer: Self-pay | Admitting: *Deleted

## 2021-04-25 NOTE — Telephone Encounter (Signed)
Patient is calling because after she stopped wearing the boot as instructed, the pain came back worse than ever.Should she be reevaluated for this problem again? Please advise. ?

## 2021-04-25 NOTE — Telephone Encounter (Signed)
Patient notified and has been scheduled on 05/03/21 w/ Dr Paulla Dolly.

## 2021-05-03 ENCOUNTER — Telehealth: Payer: Self-pay | Admitting: Podiatry

## 2021-05-03 ENCOUNTER — Ambulatory Visit (INDEPENDENT_AMBULATORY_CARE_PROVIDER_SITE_OTHER): Payer: Medicare Other

## 2021-05-03 ENCOUNTER — Ambulatory Visit (INDEPENDENT_AMBULATORY_CARE_PROVIDER_SITE_OTHER): Payer: Medicare Other | Admitting: Podiatry

## 2021-05-03 DIAGNOSIS — M7661 Achilles tendinitis, right leg: Secondary | ICD-10-CM

## 2021-05-03 MED ORDER — PREDNISONE 10 MG PO TABS: ORAL_TABLET | ORAL | 0 refills | Status: DC

## 2021-05-03 NOTE — Telephone Encounter (Signed)
Pt called wanting to know if it's ok to take with  ?

## 2021-05-03 NOTE — Progress Notes (Signed)
Subjective:  ? ?Patient ID: Kendra Norman, female   DOB: 66 y.o.   MRN: [id number]  ? ?HPI ?Patient states she is still having a lot of pain in the back of her right heel and she has had 2 physical therapy sessions so far and it still very inflamed and is this way despite wearing boot ? ? ?ROS ? ? ?   ?Objective:  ?Physical Exam  ?Neurovascular status intact muscle strength is adequate no indications of tendon dysfunction tear with quite a bit of inflammation of the posterior aspect of the right heel insertional point Achilles into the calcaneus ? ?   ?Assessment:  ?Continued acute inflammatory Achilles tendinitis right that so far has not settled down conservatively immobilization previous injection treatment ? ?   ?Plan:  ?H&P x-ray reviewed and continue physical therapy placed on Sterapred DS Dosepak and I did have Dr. Posey Pronto see this patient with me and were both in agreement that PRP may be of benefit and I am referring her to him for PRP injection.  Will be seen back may require MRI if symptoms persist ? ?X-rays indicate posterior spur formation that does not change no indications of dislodgment of the spur or other pathology ?   ? ? ?

## 2021-05-03 NOTE — Telephone Encounter (Signed)
Pt called wanting to know if it's ok to take predniSONE (DELTASONE) 10 MG tablet  ? with naproxen (NAPROSYN) 500 MG tablet, or does she need to stop?  ? ?She has also confirmed following through with her PRP injection.  ? ?Please advise.   ?

## 2021-05-05 NOTE — Telephone Encounter (Signed)
I would hold off on the naprosyn while taking dospak and then resume when finished

## 2021-05-10 ENCOUNTER — Ambulatory Visit (INDEPENDENT_AMBULATORY_CARE_PROVIDER_SITE_OTHER): Payer: Medicare Other | Admitting: Podiatry

## 2021-05-10 DIAGNOSIS — M7661 Achilles tendinitis, right leg: Secondary | ICD-10-CM

## 2021-05-10 DIAGNOSIS — M722 Plantar fascial fibromatosis: Secondary | ICD-10-CM

## 2021-05-11 ENCOUNTER — Other Ambulatory Visit (HOSPITAL_BASED_OUTPATIENT_CLINIC_OR_DEPARTMENT_OTHER): Payer: Self-pay

## 2021-05-16 NOTE — Progress Notes (Signed)
PRP injection was injected in the right Achilles tendon in standard technique according to the technique guide.  4 cc of injection was performed. ?

## 2021-05-27 ENCOUNTER — Other Ambulatory Visit: Payer: Self-pay | Admitting: Podiatry

## 2021-05-27 DIAGNOSIS — S86011A Strain of right Achilles tendon, initial encounter: Secondary | ICD-10-CM

## 2021-05-27 NOTE — Progress Notes (Signed)
Spoke with patient by phone she put her heel down yesterday and felt a severe sharp pain cannot put any weight on it, did not feel a pop can move her ankle up and down.  Advised to stay off foot is much as possible use knee scooter or crutches, ice and elevate and she is taking Naprosyn and Tylenol for pain control.  [facility], concern for rupture, ordered from [facility] imaging she has an appointment coming with Dr. Posey Pronto ?

## 2021-05-30 ENCOUNTER — Ambulatory Visit
Admission: RE | Admit: 2021-05-30 | Discharge: 2021-05-30 | Disposition: A | Discharge status: Home / Self Care | Payer: Medicare Other | Source: Ambulatory Visit | Attending: Podiatry | Admitting: Podiatry

## 2021-05-30 ENCOUNTER — Telehealth: Payer: Self-pay | Admitting: Podiatry

## 2021-05-30 DIAGNOSIS — S86011A Strain of right Achilles tendon, initial encounter: Secondary | ICD-10-CM

## 2021-05-30 NOTE — Telephone Encounter (Signed)
Patient called and stated that Dr. Sherryle Lis ordered at [facility] due to her ruptured achillis. She is scheduled for today at 12:30. Patient has appointment tomorrow, will Dr. Posey Pronto be able to have those results in the morning. ?

## 2021-05-31 ENCOUNTER — Ambulatory Visit: Payer: Medicare Other | Admitting: Podiatry

## 2021-06-01 ENCOUNTER — Ambulatory Visit (INDEPENDENT_AMBULATORY_CARE_PROVIDER_SITE_OTHER): Payer: Medicare Other | Admitting: Podiatry

## 2021-06-01 DIAGNOSIS — M21861 Other specified acquired deformities of right lower leg: Secondary | ICD-10-CM | POA: Diagnosis not present

## 2021-06-01 DIAGNOSIS — S86011A Strain of right Achilles tendon, initial encounter: Secondary | ICD-10-CM

## 2021-06-01 DIAGNOSIS — M9261 Juvenile osteochondrosis of tarsus, right ankle: Secondary | ICD-10-CM

## 2021-06-01 DIAGNOSIS — M7661 Achilles tendinitis, right leg: Secondary | ICD-10-CM | POA: Diagnosis not present

## 2021-06-01 DIAGNOSIS — R2689 Other abnormalities of gait and mobility: Secondary | ICD-10-CM

## 2021-06-04 ENCOUNTER — Encounter: Payer: Self-pay | Admitting: Podiatry

## 2021-06-04 NOTE — Progress Notes (Signed)
?  Subjective:  ?Patient ID: Kendra Norman, female    DOB: 1955-08-10,  MRN: [id number] ? ?Chief Complaint  ?Patient presents with  ? Consult  ?  SURGERY CONSULT  ? ? ?66 y.o. female presents with the above complaint. History confirmed with patient.  She returns today for urgent visit after review of MRI I spoke with her over the phone over the weekend she fell and injured the foot on Friday and MRI was completed Tuesday.  She is here for surgical planning visit and consultation as her primary provider here Dr. Posey Pronto will be out of town for the next 2 weeks.  The foot is been quite painful she is been icing and elevating and taking Aleve as needed.  She has been staying off of it and in the boot ? ?Objective:  ?Physical Exam: ?warm, good capillary refill, no trophic changes or ulcerative lesions, normal DP and PT pulses, and normal sensory exam. ? ?Right Foot: soft tissue swelling noted over the posterior Achilles at the insertion there is minor ecchymosis here as well.  Tender to palpation ? ?Prior x-ray shows Haglund deformity ? ? ?IMPRESSION: ?1. Complete insertional rupture of the Achilles tendon with moderate ?retraction. Underlying Achilles tendinosis with reactive edema in ?the calcaneal tuberosity. ?2. Mild attenuation of the flexor digitorum longus tendon within the ?plantar aspect of the midfoot without evidence of focal tear. The ?additional ankle tendons appear normal. ?3. 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 ?4. Mild tibiotalar and midfoot degenerative changes. ?  ?  ?Electronically Signed ?  By: Richardean Sale M.D. ?  On: 05/30/2021 13:08 ?Assessment:  ? ?1. Rupture of right Achilles tendon, initial encounter   ?2. Achilles tendinitis, right leg   ?3. Gastrocnemius equinus of right lower extremity   ?4. Haglund's deformity, right   ?5. Inability to bear weight   ? ? ? ?Plan:  ?Patient was evaluated and treated and all questions answered. ? ? ?Reviewed  clinical exam and radiographic findings with the patient and her husband in person today.  We reviewed the results of the MRI.  We discussed that she has a rupture of the Achilles tendon that is insertional in nature and has avulsed from the bone.  I recommended surgical intervention in the next week.  Surgical we discussed repair of the insertional rupture, gastrocnemius recession, resection of the underlying Haglund's deformity and FHL transfer.  We discussed the risk benefits and potential complications of this procedure including but not limited to  pain, swelling, infection, scar, numbness which may be temporary or permanent, chronic pain, stiffness, nerve pain or damage, wound healing problems, bone healing problems including delayed or non-union.  All questions were addressed.  Informed consent was signed and reviewed.  Surgery will be scheduled for next week. ? ? ?Surgical plan: ? ?Procedure: ?-Right leg repair of the insertional Achilles rupture, gastrocnemius recession, resection Haglund's deformity and FHL transfer. ? ?Location: ?-[facility] ? ?Anesthesia plan: ?-General anesthesia with regional block in prone position ? ?Postoperative pain plan: ?- Tylenol 1000 mg every 6 hours, gabapentin 300 mg every 8 hours x5 days, oxycodone 5 mg 1-2 tabs every 6 hours only as needed ? ?DVT prophylaxis: ?-Xarelto 10 mg nightly ? ?WB Restrictions / DME needs: ?-NWB in postop cast ? ? ? ?No follow-ups on file.  ? ?

## 2021-06-07 ENCOUNTER — Other Ambulatory Visit: Payer: Self-pay | Admitting: Podiatry

## 2021-06-07 DIAGNOSIS — M216X1 Other acquired deformities of right foot: Secondary | ICD-10-CM | POA: Diagnosis not present

## 2021-06-07 DIAGNOSIS — M2021 Hallux rigidus, right foot: Secondary | ICD-10-CM | POA: Diagnosis not present

## 2021-06-07 DIAGNOSIS — S86011A Strain of right Achilles tendon, initial encounter: Secondary | ICD-10-CM | POA: Diagnosis not present

## 2021-06-07 DIAGNOSIS — M7661 Achilles tendinitis, right leg: Secondary | ICD-10-CM | POA: Diagnosis not present

## 2021-06-07 MED ORDER — OXYCODONE HCL 5 MG PO TABS
5.0000 mg | ORAL_TABLET | ORAL | 0 refills | Status: AC | PRN
Start: 2021-06-07 — End: 2021-06-14

## 2021-06-07 MED ORDER — ACETAMINOPHEN 500 MG PO TABS: 1000.0000 mg | ORAL_TABLET | Freq: Four times a day (QID) | ORAL | 0 refills | Status: AC | PRN

## 2021-06-07 MED ORDER — RIVAROXABAN 10 MG PO TABS: 10.0000 mg | ORAL_TABLET | Freq: Every day | ORAL | 0 refills | Status: DC

## 2021-06-07 MED ORDER — GABAPENTIN 300 MG PO CAPS: 300.0000 mg | ORAL_CAPSULE | Freq: Three times a day (TID) | ORAL | 0 refills | Status: DC

## 2021-06-07 NOTE — Progress Notes (Unsigned)
06/07/21 Achilles repair, gastrocnemius recession, Haglund resection and FHL transfer ?

## 2021-06-08 ENCOUNTER — Telehealth: Payer: Self-pay | Admitting: *Deleted

## 2021-06-08 NOTE — Telephone Encounter (Signed)
Patient called back and I explained that she is not to take the anti inflammatory per Dr Sherryle Lis, verbalized understanding.

## 2021-06-08 NOTE — Telephone Encounter (Signed)
Patient thought physician told her not to take any anti inflammatory meds after prescribing a blood thinner but was given instructions from anesthesia to take ibuprofen-800 mg every 8 hours.  She has only taken 1 dose thus far. Please advise/clarify.

## 2021-06-15 ENCOUNTER — Encounter: Payer: Self-pay | Admitting: Podiatry

## 2021-06-15 ENCOUNTER — Ambulatory Visit (INDEPENDENT_AMBULATORY_CARE_PROVIDER_SITE_OTHER): Payer: Medicare Other | Admitting: Podiatry

## 2021-06-15 ENCOUNTER — Ambulatory Visit (INDEPENDENT_AMBULATORY_CARE_PROVIDER_SITE_OTHER): Payer: Medicare Other

## 2021-06-15 DIAGNOSIS — S86011A Strain of right Achilles tendon, initial encounter: Secondary | ICD-10-CM

## 2021-06-15 DIAGNOSIS — M21861 Other specified acquired deformities of right lower leg: Secondary | ICD-10-CM

## 2021-06-15 DIAGNOSIS — M9261 Juvenile osteochondrosis of tarsus, right ankle: Secondary | ICD-10-CM

## 2021-06-15 DIAGNOSIS — M62461 Contracture of muscle, right lower leg: Secondary | ICD-10-CM

## 2021-06-15 MED ORDER — GABAPENTIN 300 MG PO CAPS: 300.0000 mg | ORAL_CAPSULE | Freq: Three times a day (TID) | ORAL | 0 refills | Status: DC

## 2021-06-15 NOTE — Progress Notes (Signed)
  Subjective:  Patient ID: Kendra Norman, female    DOB: 11-07-1955,  MRN: [id number]  Chief Complaint  Patient presents with   Routine Post Op     cast off/cast on (xray)POV #1 DOS 06/07/2021 REPARI ACHILLES TENDON, REMOVAL OF BONE SPUR, CALF MUSCLE LENGTHENING, TENDON TRANSFER RIGHT     66 y.o. female returns for post-op check.  Doing well not having terrible pain.  She did notice the pain increased some and at night after she stopped the gabapentin has been elevating and icing as directed  Review of Systems: Negative except as noted in the HPI. Denies N/V/F/Ch.   Objective:  There were no vitals filed for this visit. There is no height or weight on file to calculate BMI. Constitutional Well developed. Well nourished.  Vascular Foot warm and well perfused. Capillary refill normal to all digits.  Calf is soft and supple, no posterior calf or knee pain, negative Homans' sign  Neurologic Normal speech. Oriented to person, place, and time. Epicritic sensation to light touch grossly present bilaterally.  Dermatologic Skin healing well without signs of infection. Skin edges well coapted without signs of infection.  Ecchymosis improving that was evident preop  Orthopedic: Tenderness to palpation noted about the surgical site.  Mild edema   Multiple view plain film radiographs: Interval resection of posterior calcaneal enthesophyte repair of Achilles rupture and Haglund deformity, ghost tracks for anchors and tendon transfer noted Assessment:   1. Rupture of right Achilles tendon, initial encounter   2. Gastrocnemius equinus of right lower extremity   3. Haglund's deformity, right    Plan:  Patient was evaluated and treated and all questions answered.  S/p foot surgery right -Progressing as expected post-operatively. -XR: Noted above -WB Status: NWB in below-knee cast -Sutures: Plan remove in 2 weeks. -Medications: Refill of gabapentin sent she will take this at night and as  needed -Foot redressed.  Well-padded below the knee fiberglass cast was applied.  Return in about 2 weeks (around 06/29/2021) for suture removal, cast change .

## 2021-06-16 ENCOUNTER — Telehealth: Payer: Self-pay | Admitting: *Deleted

## 2021-06-16 NOTE — Telephone Encounter (Signed)
Patient is calling because her cast that was put on 1 day ago seems looser than the one done before,doesn't think that it will slip off but is concerned.does she need to come in to have it recast?Please advise.

## 2021-06-20 NOTE — Telephone Encounter (Signed)
Please reach out to patient and see if she needs to come in sooner. If she would like a recast you can put her on [facility]'s nurse schedule. Thanks, Dr. Amalia Hailey

## 2021-06-21 ENCOUNTER — Ambulatory Visit (INDEPENDENT_AMBULATORY_CARE_PROVIDER_SITE_OTHER): Payer: Medicare Other

## 2021-06-21 ENCOUNTER — Encounter: Payer: Self-pay | Admitting: Podiatry

## 2021-06-21 DIAGNOSIS — M21861 Other specified acquired deformities of right lower leg: Secondary | ICD-10-CM

## 2021-06-21 NOTE — Telephone Encounter (Signed)
Patient is coming in today before 3:00 to have nurse change cast

## 2021-06-21 NOTE — Telephone Encounter (Signed)
Can we get this patient in today, to f/u on loose cast?

## 2021-06-21 NOTE — Telephone Encounter (Signed)
Called pt and left message for pt that if she could get here before 3pm today the nurse would be able to re apply the cast and to call if any questions

## 2021-06-21 NOTE — Telephone Encounter (Signed)
Spoke with patient, will be in today before 3,did get the message.

## 2021-06-22 NOTE — Progress Notes (Signed)
Patient seen in office for cast change after reporting cast may be too loose. Current cast removed without compilation. New cast applied as requested. Patient advised to call the office with any questions, comments, or concern and keep next scheduled post-op visit. Patient verbalized understanding.

## 2021-06-29 ENCOUNTER — Ambulatory Visit (INDEPENDENT_AMBULATORY_CARE_PROVIDER_SITE_OTHER): Payer: Medicare Other | Admitting: Podiatry

## 2021-06-29 DIAGNOSIS — S86011A Strain of right Achilles tendon, initial encounter: Secondary | ICD-10-CM

## 2021-06-29 NOTE — Patient Instructions (Signed)
Call to schedule PT at [phone] to start after next visit

## 2021-07-03 NOTE — Progress Notes (Signed)
  Subjective:  Patient ID: Kendra Norman, female    DOB: [birth year],  MRN: [id number]  Chief Complaint  Patient presents with   POV     POV #2      66 y.o. female returns for post-op check.  Overall doing well  Review of Systems: Negative except as noted in the HPI. Denies N/V/F/Ch.   Objective:  There were no vitals filed for this visit. There is no height or weight on file to calculate BMI. Constitutional Well developed. Well nourished.  Vascular Foot warm and well perfused. Capillary refill normal to all digits.  Calf is soft and supple, no posterior calf or knee pain, negative Homans' sign  Neurologic Normal speech. Oriented to person, place, and time. Epicritic sensation to light touch grossly present bilaterally.  Dermatologic Skin healing well without signs of infection. Skin edges well coapted without signs of infection.   Orthopedic: Minimal tenderness to palpation noted about the surgical site.  Resolving edema   Multiple view plain film radiographs: Interval resection of posterior calcaneal enthesophyte repair of Achilles rupture and Haglund deformity, ghost tracks for anchors and tendon transfer noted Assessment:   1. Rupture of right Achilles tendon, initial encounter    Plan:  Patient was evaluated and treated and all questions answered.  S/p foot surgery right -Sutures removed today -Foot redressed.  Well-padded below the knee fiberglass cast was re applied.  Plan to transition to boot after next visit  Return in about 3 weeks (around 07/20/2021) for move to boot after cast removal , post op (no x-rays).

## 2021-07-13 ENCOUNTER — Other Ambulatory Visit: Payer: Self-pay | Admitting: Podiatry

## 2021-07-17 ENCOUNTER — Ambulatory Visit (INDEPENDENT_AMBULATORY_CARE_PROVIDER_SITE_OTHER): Payer: Medicare Other | Admitting: Dermatology

## 2021-07-17 DIAGNOSIS — L821 Other seborrheic keratosis: Secondary | ICD-10-CM

## 2021-07-17 DIAGNOSIS — D1801 Hemangioma of skin and subcutaneous tissue: Secondary | ICD-10-CM

## 2021-07-17 DIAGNOSIS — Z1283 Encounter for screening for malignant neoplasm of skin: Secondary | ICD-10-CM

## 2021-07-17 DIAGNOSIS — L570 Actinic keratosis: Secondary | ICD-10-CM

## 2021-07-17 DIAGNOSIS — Z85828 Personal history of other malignant neoplasm of skin: Secondary | ICD-10-CM

## 2021-07-17 DIAGNOSIS — L219 Seborrheic dermatitis, unspecified: Secondary | ICD-10-CM

## 2021-07-20 ENCOUNTER — Ambulatory Visit (INDEPENDENT_AMBULATORY_CARE_PROVIDER_SITE_OTHER): Payer: Medicare Other | Admitting: Podiatry

## 2021-07-20 ENCOUNTER — Encounter: Payer: Self-pay | Admitting: Podiatry

## 2021-07-20 DIAGNOSIS — M9261 Juvenile osteochondrosis of tarsus, right ankle: Secondary | ICD-10-CM

## 2021-07-20 DIAGNOSIS — S86011A Strain of right Achilles tendon, initial encounter: Secondary | ICD-10-CM

## 2021-07-20 DIAGNOSIS — M21861 Other specified acquired deformities of right lower leg: Secondary | ICD-10-CM

## 2021-07-20 NOTE — Patient Instructions (Signed)
May begin walking on the boot gradually over next 2 weeks.  In 2 weeks, remove 2 of the felt pads (1/2 the height).   2 weeks after that (4 weeks from today), remove the remaining felt pads

## 2021-07-20 NOTE — Telephone Encounter (Signed)
Please advise 

## 2021-07-23 NOTE — Progress Notes (Signed)
  Subjective:  Patient ID: Kendra Norman, female    DOB: 09/28/1955,  MRN: [id number]  Chief Complaint  Patient presents with   Routine Post Op       for move to boot after cast removal (no x-rays).POV #3 DOS 06/07/2021 REPARI ACHILLES TENDON, REMOVAL OF BONE SPUR, CALF MUSCLE LENGTHENING, TENDON TRANSFER RIGHT     66 y.o. female returns for post-op check.  Overall doing well  Review of Systems: Negative except as noted in the HPI. Denies N/V/F/Ch.   Objective:  There were no vitals filed for this visit. There is no height or weight on file to calculate BMI. Constitutional Well developed. Well nourished.  Vascular Foot warm and well perfused. Capillary refill normal to all digits.  Calf is soft and supple, no posterior calf or knee pain, negative Homans' sign  Neurologic Normal speech. Oriented to person, place, and time. Epicritic sensation to light touch grossly present bilaterally.  Dermatologic Incision is well-healed  Orthopedic: Minimal tenderness to palpation noted about the surgical site.  Edema has improved.  Good 5 out of 5 strength,   Multiple view plain film radiographs: Interval resection of posterior calcaneal enthesophyte repair of Achilles rupture and Haglund deformity, ghost tracks for anchors and tendon transfer noted Assessment:   1. Rupture of right Achilles tendon, initial encounter   2. Gastrocnemius equinus of right lower extremity   3. Haglund's deformity, right    Plan:  Patient was evaluated and treated and all questions answered.  S/p foot surgery right -Progressing as expected.  Fiberglass cast was removed today and she is transition to the CAM boot with for Achilles wedges: She will remove 2 of these in 2 weeks and then the 2 remaining wedges will be removed 2 weeks later.  Begin physical therapy next week.  She may begin gradual weightbearing over the next 2 weeks.  Expect plan to weeks for now she will be full weightbearing  Return in about 6  weeks (around 08/31/2021) for post op (no x-rays).

## 2021-07-24 ENCOUNTER — Other Ambulatory Visit: Payer: Self-pay

## 2021-07-24 ENCOUNTER — Ambulatory Visit (HOSPITAL_BASED_OUTPATIENT_CLINIC_OR_DEPARTMENT_OTHER): Payer: Medicare Other | Attending: Podiatry | Admitting: Physical Therapy

## 2021-07-24 ENCOUNTER — Encounter (HOSPITAL_BASED_OUTPATIENT_CLINIC_OR_DEPARTMENT_OTHER): Payer: Self-pay | Admitting: Physical Therapy

## 2021-07-24 DIAGNOSIS — M6281 Muscle weakness (generalized): Secondary | ICD-10-CM | POA: Diagnosis not present

## 2021-07-24 DIAGNOSIS — S86011A Strain of right Achilles tendon, initial encounter: Secondary | ICD-10-CM | POA: Insufficient documentation

## 2021-07-24 DIAGNOSIS — M25671 Stiffness of right ankle, not elsewhere classified: Secondary | ICD-10-CM | POA: Diagnosis not present

## 2021-07-24 DIAGNOSIS — R262 Difficulty in walking, not elsewhere classified: Secondary | ICD-10-CM | POA: Diagnosis not present

## 2021-07-24 DIAGNOSIS — X58XXXA Exposure to other specified factors, initial encounter: Secondary | ICD-10-CM | POA: Diagnosis not present

## 2021-07-24 DIAGNOSIS — M25571 Pain in right ankle and joints of right foot: Secondary | ICD-10-CM | POA: Insufficient documentation

## 2021-07-24 NOTE — Therapy (Signed)
OUTPATIENT PHYSICAL THERAPY LOWER EXTREMITY EVALUATION   Patient Name: Kendra Norman MRN: [id number] DOB:Oct 31, 1955, 66 y.o., female Today's Date: 07/24/2021   PT End of Session - 07/24/21 0841     Visit Number 1    Number of Visits 25    Date for PT Re-Evaluation 10/22/21    Authorization Type MCR    PT Start Time 0845    PT Stop Time 0930    PT Time Calculation (min) 45 min    Equipment Utilized During Treatment Other (comment)   knee scooter   Activity Tolerance Patient tolerated treatment well    Behavior During Therapy WFL for tasks assessed/performed             Past Medical History:  Diagnosis Date   GERD (gastroesophageal reflux disease)    Seasonal allergies    Past Surgical History:  Procedure Laterality Date   ABDOMINAL HYSTERECTOMY     BREAST BIOPSY     BREAST EXCISIONAL BIOPSY Bilateral    fibroadenomas - benign   BREAST LUMPECTOMY Bilateral    fibroadenomas - benign   VEIN SURGERY     Patient Active Problem List   Diagnosis Date Noted   Strain of muscle, fascia and tendon of lower back, initial encounter 10/16/2018    PCP: Aletha Halim., PA-C  REFERRING PROVIDER: Criselda Peaches, DPM  REFERRING DIAG: [phone] (ICD-10-CM) - Rupture of right Achilles tendon, initial encounter  THERAPY DIAG:  Pain in right ankle and joints of right foot - Plan: PT plan of care cert/re-cert  Stiffness of right ankle, not elsewhere classified - Plan: PT plan of care cert/re-cert  Difficulty walking - Plan: PT plan of care cert/re-cert  Muscle weakness (generalized) - Plan: PT plan of care cert/re-cert  Rationale for Evaluation and Treatment Rehabilitation  ONSET DATE: 06/07/2021 REPAIR ACHILLES TENDON, REMOVAL OF BONE SPUR, CALF MUSCLE LENGTHENING, TENDON TRANSFER RIGHT  SUBJECTIVE:   SUBJECTIVE STATEMENT: Pt states in back in the Fall, she had some what she thought was plantar fasciitis. In January, she had injections and was put into the put. In  April, she had PRP. In May she was putting on pants and felt that the "ankle went." Balance on the R leg. She had surgery 5/17. Pt plays golf. She also does water running. Husband at home helping.   Pt states she walks a lot. She even walks 30 min at a time with the knee scooter. Pain is well managed. No pain since day 3 post op. Pt denies signs of DVT.   PAIN:  Are you having pain? No: NPRS scale: 0/10 Pain location: R ankle incision Pain description: sharp, NT on top of    PRECAUTIONS: Other: Achilles  Per MD: 06/07/21 Surgery. Begin PT after 07/24/21, NWB exercise on strength and ROM until 7/17, then begin WB exercises  WEIGHT BEARING RESTRICTIONS Yes NWB  FALLS:  Has patient fallen in last 6 months? No  LIVING ENVIRONMENT: Lives with: lives with their spouse Lives in: House/apartment Stairs: Yes, steps to enter and 2nd floor home gym  Has following equipment at home: Gilford Rile - 2 wheeled, Crutches, and knee scooter  OCCUPATION: retired   PLOF: Independent, has [employer] center   PATIENT GOALS : Pt states she would like to play golf and return exercising/walking/ bike/ pickleball   OBJECTIVE:   DIAGNOSTIC FINDINGS:   IMPRESSION: 1. Complete insertional rupture of the Achilles tendon with moderate retraction. Underlying Achilles tendinosis with reactive edema in the calcaneal tuberosity. 2.  Mild attenuation of the flexor digitorum longus tendon within the plantar aspect of the midfoot without evidence of focal tear. The additional ankle tendons appear normal. 3. 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 4. Mild tibiotalar and midfoot degenerative changes.  PATIENT SURVEYS:  FOTO 54 65 DC 12 pts MCII  COGNITION:  Overall cognitive status: Within functional limits for tasks assessed     SENSATION: Light touch: Impaired  dorsum of foot  EDEMA:  Moderate, non pitting noted on dorsum of R foot and into phalanges     POSTURE: No Significant postural limitations  PALPATION: No TTP noted around incision site; site clean and healed   LOWER EXTREMITY ROM:  Active ROM Right eval Left eval  Ankle dorsiflexion -30 6  Ankle plantarflexion 50 55  Ankle inversion 12 30  Ankle eversion 8 20   (Blank rows = not tested)  LOWER EXTREMITY MMT: Unable to test at ankle given surgical precautions; no issues with functional transfers with R hip and knee or L LE.  LOWER EXTREMITY SPECIAL TESTS:  Ankle special tests: Thompson's test: negative    GAIT: Distance walked: 5f Assistive device utilized:  knee scooter Level of assistance: Modified independence Comments: WFL    TODAY'S TREATMENT:   Exercises - Long Sitting Calf Stretch with Strap  - 2 x daily - 7 x weekly - 1 sets - 3 reps - 30 hold - Ankle Alphabet in Elevation  - 2 x daily - 7 x weekly - 2 sets - 2 reps - Seated Ankle Dorsiflexion AROM  - 2 x daily - 7 x weekly - 2 sets - 10 reps - Seated Ankle Dorsiflexion Stretch  - 2 x daily - 7 x weekly - 2 sets - 10 reps   PATIENT EDUCATION:  Education details: Protocol/guidelines, surgical protection, diagnosis, prognosis, anatomy, exercise progression, DOMS expectations, muscle firing,  envelope of function, HEP, POC  Person educated: Patient Education method: Explanation, Demonstration, Tactile cues, Verbal cues, and Handouts Education comprehension: verbalized understanding, returned demonstration, verbal cues required, and tactile cues required   HOME EXERCISE PROGRAM: Access Code: [email or web address] URL: [email or web address] Date: 07/24/2021 Prepared by: ADaleen Bo ASSESSMENT:  CLINICAL IMPRESSION: Patient is a 66y.o. female who was seen today for physical therapy evaluation and treatment s/p R achilles tendon repair. Pt with expected gait deviations, weakness, difficulty with balance and daily mobility. Per MD order, pt is to be NWB until 7/17. Pt would benefit from  continued skilled therapy in order to reach goals and maximize functional R LE strength and ROM for full return to PLOF.     OBJECTIVE IMPAIRMENTS Abnormal gait, decreased activity tolerance, decreased balance, decreased endurance, decreased knowledge of use of DME, decreased mobility, difficulty walking, decreased ROM, decreased strength, hypomobility, increased edema, increased fascial restrictions, impaired flexibility, impaired sensation, improper body mechanics, postural dysfunction, and pain.   ACTIVITY LIMITATIONS carrying, lifting, standing, squatting, stairs, transfers, bed mobility, bathing, toileting, dressing, locomotion level, and caring for others  PARTICIPATION LIMITATIONS: meal prep, cleaning, laundry, interpersonal relationship, driving, shopping, community activity, yard work, and exercise  PERSONAL FACTORS Age  affecting patient's functional outcome.   REHAB POTENTIAL: Good  CLINICAL DECISION MAKING: Stable/uncomplicated  EVALUATION COMPLEXITY: Low   GOALS:   SHORT TERM GOALS: Target date: 09/04/2021  Pt will become independent with initial HEP in order to demonstrate synthesis of PT education.   Goal status: INITIAL  2.  Pt will be able to demonstrate FWB and normalized gait in  order to demonstrate functional improvement in LE function for self-care and house hold duties.   Goal status: INITIAL  3.  Pt will score at least 12 pt increase on FOTO to demonstrate functional improvement in MCII and pt perceived function.     Goal status: INITIAL    LONG TERM GOALS: Target date: 10/16/2021   Pt  will become independent with final HEP in order to demonstrate synthesis of PT education.   Goal status: INITIAL  2.  Pt will score >/= 65 on FOTO to demonstrate improvement in perceived R LE function.   Goal status: INITIAL  3.  Pt will be able to demonstrate/report ability to walk >30 mins without pain in order to demonstrate functional improvement and tolerance to  exercise and community mobility.   Goal status: INITIAL  4.  Pt will be able to demonstrate ability to perform 20 consecutive SL calf raise in order to demonstrate functional improvement in LE function for functional improvement in SL strength and stability for return to PLOF.  Goal status: INITIAL   PLAN: PT FREQUENCY: 1-2x/week  PT DURATION: 12 weeks  PLANNED INTERVENTIONS: Therapeutic exercises, Therapeutic activity, Neuromuscular re-education, Balance training, Gait training, Patient/Family education, Joint manipulation, Joint mobilization, Stair training, Orthotic/Fit training, DME instructions, Aquatic Therapy, Dry Needling, Electrical stimulation, Spinal manipulation, Spinal mobilization, Cryotherapy, Moist heat, Compression bandaging, scar mobilization, Splintting, Taping, Vasopneumatic device, Traction, Ultrasound, Ionotophoresis '4mg'$ /ml Dexamethasone, Manual therapy, and Re-evaluation  PLAN FOR NEXT SESSION: gait training with crutches, PROM/ankle joint mobs, towel curls, BAPS   Daleen Bo, PT, DPT 07/24/2021, 10:32 AM

## 2021-07-31 ENCOUNTER — Encounter (HOSPITAL_BASED_OUTPATIENT_CLINIC_OR_DEPARTMENT_OTHER): Payer: Self-pay | Admitting: Physical Therapy

## 2021-07-31 ENCOUNTER — Ambulatory Visit (HOSPITAL_BASED_OUTPATIENT_CLINIC_OR_DEPARTMENT_OTHER): Payer: Medicare Other | Admitting: Physical Therapy

## 2021-07-31 DIAGNOSIS — M25571 Pain in right ankle and joints of right foot: Secondary | ICD-10-CM

## 2021-07-31 DIAGNOSIS — R262 Difficulty in walking, not elsewhere classified: Secondary | ICD-10-CM

## 2021-07-31 DIAGNOSIS — S86011A Strain of right Achilles tendon, initial encounter: Secondary | ICD-10-CM | POA: Diagnosis not present

## 2021-07-31 DIAGNOSIS — M6281 Muscle weakness (generalized): Secondary | ICD-10-CM

## 2021-07-31 DIAGNOSIS — M25671 Stiffness of right ankle, not elsewhere classified: Secondary | ICD-10-CM

## 2021-07-31 NOTE — Therapy (Signed)
OUTPATIENT PHYSICAL TREATMENT NOTE   Patient Name: Kendra Norman MRN: [id number] DOB:1956-01-09, 66 y.o., female Today's Date: 07/31/2021   PT End of Session - 07/31/21 1051     Visit Number 2    Number of Visits 25    Date for PT Re-Evaluation 10/22/21    Authorization Type MCR    PT Start Time 0937    PT Stop Time 1018    PT Time Calculation (min) 41 min    Equipment Utilized During Treatment Other (comment)   knee scooter   Activity Tolerance Patient tolerated treatment well    Behavior During Therapy WFL for tasks assessed/performed              Past Medical History:  Diagnosis Date   GERD (gastroesophageal reflux disease)    Seasonal allergies    Past Surgical History:  Procedure Laterality Date   ABDOMINAL HYSTERECTOMY     BREAST BIOPSY     BREAST EXCISIONAL BIOPSY Bilateral    fibroadenomas - benign   BREAST LUMPECTOMY Bilateral    fibroadenomas - benign   VEIN SURGERY     Patient Active Problem List   Diagnosis Date Noted   Strain of muscle, fascia and tendon of lower back, initial encounter 10/16/2018    PCP: Aletha Halim., PA-C  REFERRING PROVIDER: Criselda Peaches, DPM  REFERRING DIAG: [id number] (ICD-10-CM) - Rupture of right Achilles tendon, initial encounter  THERAPY DIAG:  Pain in right ankle and joints of right foot  Stiffness of right ankle, not elsewhere classified  Difficulty walking  Muscle weakness (generalized)  Rationale for Evaluation and Treatment Rehabilitation  ONSET DATE: 06/07/2021 REPAIR ACHILLES TENDON, REMOVAL OF BONE SPUR, CALF MUSCLE LENGTHENING, TENDON TRANSFER RIGHT  SUBJECTIVE:   SUBJECTIVE STATEMENT:  Pt states she has had no pain with HEP. She has been very compliant. She has trouble with seated ankle DF.   Eval:  Pt states in back in the Fall, she had some what she thought was plantar fasciitis. In January, she had injections and was put into the put. In April, she had PRP. In May she was putting on  pants and felt that the "ankle went." Balance on the R leg. She had surgery 5/17. Pt plays golf. She also does water running. Husband at home helping.   Pt states she walks a lot. She even walks 30 min at a time with the knee scooter. Pain is well managed. No pain since day 3 post op. Pt denies signs of DVT.   PAIN:  Are you having pain? No: NPRS scale: 0/10 Pain location: R ankle incision Pain description: sharp, NT on top of    PRECAUTIONS: Other: Achilles  Per MD: 06/07/21 Surgery. Begin PT after 07/24/21, NWB exercise on strength and ROM until 7/17, then begin WB exercises  WEIGHT BEARING RESTRICTIONS Yes NWB  FALLS:  Has patient fallen in last 6 months? No  LIVING ENVIRONMENT: Lives with: lives with their spouse Lives in: House/apartment Stairs: Yes, steps to enter and 2nd floor home gym  Has following equipment at home: Gil[employer] Rile - 2 wheeled, Crutches, and knee scooter  OCCUPATION: retired   PLOF: Independent, has [employer] center   PATIENT GOALS : Pt states she would like to play golf and return exercising/walking/ bike/ pickleball   OBJECTIVE:   DIAGNOSTIC FINDINGS:   IMPRESSION: 1. Complete insertional rupture of the Achilles tendon with moderate retraction. Underlying Achilles tendinosis with reactive edema in the calcaneal tuberosity. 2. Mild attenuation of  the flexor digitorum longus tendon within the plantar aspect of the midfoot without evidence of focal tear. The additional ankle tendons appear normal. 3. 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 4. Mild tibiotalar and midfoot degenerative changes.  PATIENT SURVEYS:  FOTO 54 5 DC 12 pts MCII   LOWER EXTREMITY ROM:  Active ROM Right eval R  7/10 Left eval  Ankle dorsiflexion -30 0 6  Ankle plantarflexion 50  55  Ankle inversion 12  30  Ankle eversion 8  20   (Blank rows = not tested)   TODAY'S TREATMENT: Manual: R ankle TCJ post glide DF  mob grade III  Exercises - Long Sitting Calf Stretch with Strap  - 2 x daily - 7 x weekly - 1 sets - 3 reps - 30 hold - Ankle Alphabet in Elevation  - 2 x daily - 7 x weekly - 2 sets - 2 reps - Seated Ankle Dorsiflexion Resisted with YTB  - 2 x daily - 7 x weekly - 2 sets - 10 reps - Seated Ankle Dorsiflexion Stretch  - 2 x daily - 7 x weekly - 2 sets - 10 reps -toe yoga 20x -seated toe ext stretch 10s 10x  Gait: AD sizing, swing through gait pattern, stair management technique and sequencing (with gait belt)  Stumble with pivoting on step, clinician able to catch pt with gait belt, pt able to use UE for support, no injuries or fall   PATIENT EDUCATION:  Education details: AD sizing/ usage, anatomy, exercise progression, DOMS expectations, muscle firing,  envelope of function, HEP, POC  Person educated: Patient Education method: Explanation, Demonstration, Tactile cues, Verbal cues, and Handouts Education comprehension: verbalized understanding, returned demonstration, verbal cues required, and tactile cues required   HOME EXERCISE PROGRAM: Access Code: [email or web address] URL: [email or web address] Date: 07/24/2021 Prepared by: Daleen Bo  ASSESSMENT:  CLINICAL IMPRESSION: Pt with significant improvement in R ankle DF ROM at today's session. Pt able to introduce light resisted 3 way ankle motion today without pain. Pt progressing well with ROM. AD sided appropriately at today's session and pt given AD training. Pt able to return demo. Pt does have significant apprehension with stair management with NWB at this time. Pt with LOB while on stairs but no injury or fall occurred. Plan to revisit stair management at next session. Pt advised to practice stairs only with PT. Pt to walk with crutches for short distances and knee scoot for long distance or hurried travel. Per MD order, pt is to be NWB until 7/17. Pt would benefit from continued skilled therapy in order to reach goals and  maximize functional R LE strength and ROM for full return to PLOF.     OBJECTIVE IMPAIRMENTS Abnormal gait, decreased activity tolerance, decreased balance, decreased endurance, decreased knowledge of use of DME, decreased mobility, difficulty walking, decreased ROM, decreased strength, hypomobility, increased edema, increased fascial restrictions, impaired flexibility, impaired sensation, improper body mechanics, postural dysfunction, and pain.   ACTIVITY LIMITATIONS carrying, lifting, standing, squatting, stairs, transfers, bed mobility, bathing, toileting, dressing, locomotion level, and caring for others  PARTICIPATION LIMITATIONS: meal prep, cleaning, laundry, interpersonal relationship, driving, shopping, community activity, yard work, and exercise  PERSONAL FACTORS Age  affecting patient's functional outcome.   REHAB POTENTIAL: Good  CLINICAL DECISION MAKING: Stable/uncomplicated  EVALUATION COMPLEXITY: Low   GOALS:   SHORT TERM GOALS: Target date: 09/04/2021  Pt will become independent with initial HEP in order to demonstrate synthesis of PT education.  Goal status: INITIAL  2.  Pt will be able to demonstrate FWB and normalized gait in order to demonstrate functional improvement in LE function for self-care and house hold duties.   Goal status: INITIAL  3.  Pt will score at least 12 pt increase on FOTO to demonstrate functional improvement in MCII and pt perceived function.     Goal status: INITIAL    LONG TERM GOALS: Target date: 10/16/2021   Pt  will become independent with final HEP in order to demonstrate synthesis of PT education.   Goal status: INITIAL  2.  Pt will score >/= 65 on FOTO to demonstrate improvement in perceived R LE function.   Goal status: INITIAL  3.  Pt will be able to demonstrate/report ability to walk >30 mins without pain in order to demonstrate functional improvement and tolerance to exercise and community mobility.   Goal status:  INITIAL  4.  Pt will be able to demonstrate ability to perform 20 consecutive SL calf raise in order to demonstrate functional improvement in LE function for functional improvement in SL strength and stability for return to PLOF.  Goal status: INITIAL   PLAN: PT FREQUENCY: 1-2x/week  PT DURATION: 12 weeks  PLANNED INTERVENTIONS: Therapeutic exercises, Therapeutic activity, Neuromuscular re-education, Balance training, Gait training, Patient/Family education, Joint manipulation, Joint mobilization, Stair training, Orthotic/Fit training, DME instructions, Aquatic Therapy, Dry Needling, Electrical stimulation, Spinal manipulation, Spinal mobilization, Cryotherapy, Moist heat, Compression bandaging, scar mobilization, Splintting, Taping, Vasopneumatic device, Traction, Ultrasound, Ionotophoresis '4mg'$ /ml Dexamethasone, Manual therapy, and Re-evaluation  PLAN FOR NEXT SESSION: PROM/ankle joint mobs,  BAPS   Daleen Bo, PT, DPT 07/31/2021, 10:58 AM

## 2021-08-02 ENCOUNTER — Encounter (HOSPITAL_BASED_OUTPATIENT_CLINIC_OR_DEPARTMENT_OTHER): Payer: Self-pay | Admitting: Physical Therapy

## 2021-08-02 ENCOUNTER — Ambulatory Visit (HOSPITAL_BASED_OUTPATIENT_CLINIC_OR_DEPARTMENT_OTHER): Payer: Medicare Other | Admitting: Physical Therapy

## 2021-08-02 DIAGNOSIS — R262 Difficulty in walking, not elsewhere classified: Secondary | ICD-10-CM

## 2021-08-02 DIAGNOSIS — S86011A Strain of right Achilles tendon, initial encounter: Secondary | ICD-10-CM | POA: Diagnosis not present

## 2021-08-02 DIAGNOSIS — M25571 Pain in right ankle and joints of right foot: Secondary | ICD-10-CM

## 2021-08-02 DIAGNOSIS — M25671 Stiffness of right ankle, not elsewhere classified: Secondary | ICD-10-CM

## 2021-08-02 DIAGNOSIS — M6281 Muscle weakness (generalized): Secondary | ICD-10-CM

## 2021-08-02 NOTE — Therapy (Signed)
OUTPATIENT PHYSICAL TREATMENT NOTE   Patient Name: Kendra Norman MRN: [id number] DOB:1955/06/03, 66 y.o., female Today's Date: 08/02/2021   PT End of Session - 08/02/21 1012     Visit Number 3    Number of Visits 25    Date for PT Re-Evaluation 10/22/21    Authorization Type MCR    Progress Note Due on Visit 10    PT Start Time 0932    PT Stop Time 1012    PT Time Calculation (min) 40 min    Equipment Utilized During Treatment Gait belt    Activity Tolerance Patient tolerated treatment well    Behavior During Therapy WFL for tasks assessed/performed               Past Medical History:  Diagnosis Date   GERD (gastroesophageal reflux disease)    Seasonal allergies    Past Surgical History:  Procedure Laterality Date   ABDOMINAL HYSTERECTOMY     BREAST BIOPSY     BREAST EXCISIONAL BIOPSY Bilateral    fibroadenomas - benign   BREAST LUMPECTOMY Bilateral    fibroadenomas - benign   VEIN SURGERY     Patient Active Problem List   Diagnosis Date Noted   Strain of muscle, fascia and tendon of lower back, initial encounter 10/16/2018    PCP: Aletha Halim., PA-C  REFERRING PROVIDER: Criselda Peaches, DPM  REFERRING DIAG: [phone] (ICD-10-CM) - Rupture of right Achilles tendon, initial encounter  THERAPY DIAG:  Pain in right ankle and joints of right foot  Stiffness of right ankle, not elsewhere classified  Difficulty walking  Muscle weakness (generalized)  Rationale for Evaluation and Treatment Rehabilitation  ONSET DATE: 06/07/2021 REPAIR ACHILLES TENDON, REMOVAL OF BONE SPUR, CALF MUSCLE LENGTHENING, TENDON TRANSFER RIGHT  SUBJECTIVE:   SUBJECTIVE STATEMENT:  I'm doing OK, still really hesitant to use crutches as I still feel really unsteady. Not able to do what I need to do around the house using the crutches. I fell about 2 weeks ago, I still have some wounds on my knee and they're not healing bc of the pressure I put on my knee using the scooter.  Try to walk with my scooter for about 1-2 miles per day.   Eval:  Pt states in back in the Fall, she had some what she thought was plantar fasciitis. In January, she had injections and was put into the put. In April, she had PRP. In May she was putting on pants and felt that the "ankle went." Balance on the R leg. She had surgery 5/17. Pt plays golf. She also does water running. Husband at home helping.   Pt states she walks a lot. She even walks 30 min at a time with the knee scooter. Pain is well managed. No pain since day 3 post op. Pt denies signs of DVT.   PAIN:  Are you having pain? No: NPRS scale: 0/10 Pain location: R ankle incision Pain description: sharp, NT on top of    PRECAUTIONS: Other: Achilles  Per MD: 06/07/21 Surgery. Begin PT after 07/24/21, NWB exercise on strength and ROM until 7/17, then begin WB exercises; update 7/12 per Dr. Sherryle Lis Epic secure chat WBAT in boot but no WB therapy exercises until next week   WEIGHT BEARING RESTRICTIONS Yes NWB  FALLS:  Has patient fallen in last 6 months? No  LIVING ENVIRONMENT: Lives with: lives with their spouse Lives in: House/apartment Stairs: Yes, steps to enter and 2nd floor home gym  Has following equipment at home: Gilford Rile - 2 wheeled, Crutches, and knee scooter  OCCUPATION: retired   PLOF: Independent, has [employer]   PATIENT GOALS : Pt states she would like to play golf and return exercising/walking/ bike/ pickleball   OBJECTIVE:   DIAGNOSTIC FINDINGS:   IMPRESSION: 1. Complete insertional rupture of the Achilles tendon with moderate retraction. Underlying Achilles tendinosis with reactive edema in the calcaneal tuberosity. 2. Mild attenuation of the flexor digitorum longus tendon within the plantar aspect of the midfoot without evidence of focal tear. The additional ankle tendons appear normal. 3. 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 4. Mild  tibiotalar and midfoot degenerative changes.  PATIENT SURVEYS:  FOTO 54 57 DC 12 pts MCII   LOWER EXTREMITY ROM:  Active ROM Right eval R  7/10 Left eval  Ankle dorsiflexion -30 0 6  Ankle plantarflexion 50  55  Ankle inversion 12  30  Ankle eversion 8  20   (Blank rows = not tested)   TODAY'S TREATMENT 08/02/21:  Stretches R gastroc to tolerance 5x30 seconds, joint mobs grade III all directions to tolerance   SLRs R LE 1x15 Sidelying Hip ABD 1x15 B Prone hip ABD 1x15 B  Review of toe yoga, also towel crunches, arch raises   Gait training WBAT in boot with B crutches Min cues for technique/sequencing, then stair training 2 steps in gym B crutches min guard for safety/Mod cues for sequencing       PATIENT EDUCATION:  Education details: AD sizing/ usage, anatomy, exercise progression, DOMS expectations, muscle firing,  envelope of function, HEP, POC  Person educated: Patient Education method: Explanation, Demonstration, Tactile cues, Verbal cues, and Handouts Education comprehension: verbalized understanding, returned demonstration, verbal cues required, and tactile cues required   HOME EXERCISE PROGRAM: Access Code: [email or web address] URL: [email or web address] Date: 07/24/2021 Prepared by: Daleen Bo  ASSESSMENT:  CLINICAL IMPRESSION:  Zerline arrives today doing OK, asked me to double check with MD about WB status- I directly messaged Dr. Sherryle Lis in Epic secure chat and he cleared her for WBAT in her boot for gait only at this point. Continued working on stretching and joint mobs to R ankle, also hip strengthening and practiced gait WBAT with crutches today. Able to tolerate about 50% WB in boot with crutches today due to arch pain.    OBJECTIVE IMPAIRMENTS Abnormal gait, decreased activity tolerance, decreased balance, decreased endurance, decreased knowledge of use of DME, decreased mobility, difficulty walking, decreased ROM, decreased strength,  hypomobility, increased edema, increased fascial restrictions, impaired flexibility, impaired sensation, improper body mechanics, postural dysfunction, and pain.   ACTIVITY LIMITATIONS carrying, lifting, standing, squatting, stairs, transfers, bed mobility, bathing, toileting, dressing, locomotion level, and caring for others  PARTICIPATION LIMITATIONS: meal prep, cleaning, laundry, interpersonal relationship, driving, shopping, community activity, yard work, and exercise  PERSONAL FACTORS Age  affecting patient's functional outcome.   REHAB POTENTIAL: Good  CLINICAL DECISION MAKING: Stable/uncomplicated  EVALUATION COMPLEXITY: Low   GOALS:   SHORT TERM GOALS: Target date: 09/04/2021  Pt will become independent with initial HEP in order to demonstrate synthesis of PT education.   Goal status: INITIAL  2.  Pt will be able to demonstrate FWB and normalized gait in order to demonstrate functional improvement in LE function for self-care and house hold duties.   Goal status: INITIAL  3.  Pt will score at least 12 pt increase on FOTO to demonstrate functional improvement in MCII and pt perceived function.  Goal status: INITIAL    LONG TERM GOALS: Target date: 10/16/2021   Pt  will become independent with final HEP in order to demonstrate synthesis of PT education.   Goal status: INITIAL  2.  Pt will score >/= 65 on FOTO to demonstrate improvement in perceived R LE function.   Goal status: INITIAL  3.  Pt will be able to demonstrate/report ability to walk >30 mins without pain in order to demonstrate functional improvement and tolerance to exercise and community mobility.   Goal status: INITIAL  4.  Pt will be able to demonstrate ability to perform 20 consecutive SL calf raise in order to demonstrate functional improvement in LE function for functional improvement in SL strength and stability for return to PLOF.  Goal status: INITIAL   PLAN: PT FREQUENCY:  1-2x/week  PT DURATION: 12 weeks  PLANNED INTERVENTIONS: Therapeutic exercises, Therapeutic activity, Neuromuscular re-education, Balance training, Gait training, Patient/Family education, Joint manipulation, Joint mobilization, Stair training, Orthotic/Fit training, DME instructions, Aquatic Therapy, Dry Needling, Electrical stimulation, Spinal manipulation, Spinal mobilization, Cryotherapy, Moist heat, Compression bandaging, scar mobilization, Splintting, Taping, Vasopneumatic device, Traction, Ultrasound, Ionotophoresis '4mg'$ /ml Dexamethasone, Manual therapy, and Re-evaluation  PLAN FOR NEXT SESSION: progress as able and tolerated, WBAT in boot per Dr. Sherryle Lis BUT no WB exercises in PT until next week     Keian Odriscoll U PT DPT PN2  08/02/2021, 10:15 AM  08/02/2021, 10:15 AM

## 2021-08-07 ENCOUNTER — Ambulatory Visit (HOSPITAL_BASED_OUTPATIENT_CLINIC_OR_DEPARTMENT_OTHER): Payer: Medicare Other | Admitting: Physical Therapy

## 2021-08-07 ENCOUNTER — Encounter (HOSPITAL_BASED_OUTPATIENT_CLINIC_OR_DEPARTMENT_OTHER): Payer: Self-pay | Admitting: Physical Therapy

## 2021-08-07 DIAGNOSIS — M25571 Pain in right ankle and joints of right foot: Secondary | ICD-10-CM

## 2021-08-07 DIAGNOSIS — S86011A Strain of right Achilles tendon, initial encounter: Secondary | ICD-10-CM | POA: Diagnosis not present

## 2021-08-07 DIAGNOSIS — M25671 Stiffness of right ankle, not elsewhere classified: Secondary | ICD-10-CM

## 2021-08-07 DIAGNOSIS — R262 Difficulty in walking, not elsewhere classified: Secondary | ICD-10-CM

## 2021-08-07 DIAGNOSIS — M6281 Muscle weakness (generalized): Secondary | ICD-10-CM

## 2021-08-07 NOTE — Therapy (Signed)
OUTPATIENT PHYSICAL TREATMENT NOTE   Patient Name: Kendra Norman MRN: [id number] DOB:[birth year], 66 y.o., female Today's Date: 08/07/2021   PT End of Session - 08/07/21 1015     Visit Number 4    Number of Visits 25    Date for PT Re-Evaluation 10/22/21    Authorization Type MCR    Progress Note Due on Visit 10    PT Start Time 0930    PT Stop Time 1010    PT Time Calculation (min) 40 min    Equipment Utilized During Treatment Gait belt    Activity Tolerance Patient tolerated treatment well    Behavior During Therapy WFL for tasks assessed/performed                Past Medical History:  Diagnosis Date   GERD (gastroesophageal reflux disease)    Seasonal allergies    Past Surgical History:  Procedure Laterality Date   ABDOMINAL HYSTERECTOMY     BREAST BIOPSY     BREAST EXCISIONAL BIOPSY Bilateral    fibroadenomas - benign   BREAST LUMPECTOMY Bilateral    fibroadenomas - benign   VEIN SURGERY     Patient Active Problem List   Diagnosis Date Noted   Strain of muscle, fascia and tendon of lower back, initial encounter 10/16/2018    PCP: Aletha Halim., PA-C  REFERRING PROVIDER: Criselda Peaches, DPM  REFERRING DIAG: [phone] (ICD-10-CM) - Rupture of right Achilles tendon, initial encounter  THERAPY DIAG:  Pain in right ankle and joints of right foot  Stiffness of right ankle, not elsewhere classified  Difficulty walking  Muscle weakness (generalized)  Rationale for Evaluation and Treatment Rehabilitation  ONSET DATE: 06/07/2021 REPAIR ACHILLES TENDON, REMOVAL OF BONE SPUR, CALF MUSCLE LENGTHENING, TENDON TRANSFER RIGHT  SUBJECTIVE:   SUBJECTIVE STATEMENT:  Pt states she had no pain with anything after last session. Pt she is practicing walking with crutches and is more TTWB.   Eval:  Pt states in back in the Fall, she had some what she thought was plantar fasciitis. In January, she had injections and was put into the put. In April, she had  PRP. In May she was putting on pants and felt that the "ankle went." Balance on the R leg. She had surgery 5/17. Pt plays golf. She also does water running. Husband at home helping.   Pt states she walks a lot. She even walks 30 min at a time with the knee scooter. Pain is well managed. No pain since day 3 post op. Pt denies signs of DVT.   PAIN:  Are you having pain? No: NPRS scale: 0/10 Pain location: R ankle incision Pain description: sharp, NT on top of    PRECAUTIONS: Other: Achilles  Per MD: 06/07/21 Surgery. Begin PT after 07/24/21, NWB exercise on strength and ROM until 7/17, then begin WB exercises; update 7/12 per Dr. Sherryle Lis Epic secure chat WBAT in boot but no WB therapy exercises until next week   WEIGHT BEARING RESTRICTIONS Yes NWB  FALLS:  Has patient fallen in last 6 months? No  LIVING ENVIRONMENT: Lives with: lives with their spouse Lives in: House/apartment Stairs: Yes, steps to enter and 2nd floor home gym  Has following equipment at home: Gilford Rile - 2 wheeled, Crutches, and knee scooter  OCCUPATION: retired   PLOF: Independent, has membership aquatic center   PATIENT GOALS : Pt states she would like to play golf and return exercising/walking/ bike/ pickleball   OBJECTIVE:   DIAGNOSTIC  FINDINGS:   IMPRESSION: 1. Complete insertional rupture of the Achilles tendon with moderate retraction. Underlying Achilles tendinosis with reactive edema in the calcaneal tuberosity. 2. Mild attenuation of the flexor digitorum longus tendon within the plantar aspect of the midfoot without evidence of focal tear. The additional ankle tendons appear normal. 3. 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 4. Mild tibiotalar and midfoot degenerative changes.  PATIENT SURVEYS:  FOTO 54 52 DC 12 pts MCII   LOWER EXTREMITY ROM:  Active ROM Right eval R  7/10 7/17 Left eval  Ankle dorsiflexion -30 0 3 6  Ankle plantarflexion 50    55  Ankle inversion 12   30  Ankle eversion 8   20   (Blank rows = not tested)   TODAY'S TREATMENT 7/17  R TCJ post glide mob grade III-IV  Seated ankle DF stretch 10s 10x Standing weight shifting without boot 5s 10x Heel toe rocking with boot on 2x10  Gait training: Single crutch, boot on; focus on improving R stance time, decrease L trunk lean, possible L shoe lift to reduce L lean and back pain    08/02/21:  Stretches R gastroc to tolerance 5x30 seconds, joint mobs grade III all directions to tolerance   SLRs R LE 1x15 Sidelying Hip ABD 1x15 B Prone hip ABD 1x15 B  Review of toe yoga, also towel crunches, arch raises   Gait training WBAT in boot with B crutches Min cues for technique/sequencing, then stair training 2 steps in gym B crutches min guard for safety/Mod cues for sequencing       PATIENT EDUCATION:  Education details: AD sizing/ usage, anatomy, exercise progression, DOMS expectations, muscle firing,  envelope of function, HEP, POC  Person educated: Patient Education method: Explanation, Demonstration, Tactile cues, Verbal cues, and Handouts Education comprehension: verbalized understanding, returned demonstration, verbal cues required, and tactile cues required   HOME EXERCISE PROGRAM: Access Code: [email or web address] URL: [email or web address] Date: 07/24/2021 Prepared by: Daleen Bo  ASSESSMENT:  CLINICAL IMPRESSION:  Pt able to progress to single crutch gait and full FWB on the R LE in static standing. Pt does have expected decrease in stance time and heel pain that is consistent with heel fat pad pain. Pt safely able to demo stair management with single crutch but VC and TC requires for sequencing and safety. Pt is apprehensive with gait but is able to perform better quality single AD gait with practice and SBA. Pt progressing well. Pt to leave for 2 wk vacation and resume PT following. Pt would benefit from continued skilled therapy in order  to reach goals and maximize functional R LE strength and ROM for full return to PLOF.    OBJECTIVE IMPAIRMENTS Abnormal gait, decreased activity tolerance, decreased balance, decreased endurance, decreased knowledge of use of DME, decreased mobility, difficulty walking, decreased ROM, decreased strength, hypomobility, increased edema, increased fascial restrictions, impaired flexibility, impaired sensation, improper body mechanics, postural dysfunction, and pain.   ACTIVITY LIMITATIONS carrying, lifting, standing, squatting, stairs, transfers, bed mobility, bathing, toileting, dressing, locomotion level, and caring for others  PARTICIPATION LIMITATIONS: meal prep, cleaning, laundry, interpersonal relationship, driving, shopping, community activity, yard work, and exercise  PERSONAL FACTORS Age  affecting patient's functional outcome.   REHAB POTENTIAL: Good  CLINICAL DECISION MAKING: Stable/uncomplicated  EVALUATION COMPLEXITY: Low   GOALS:   SHORT TERM GOALS: Target date: 09/04/2021  Pt will become independent with initial HEP in order to demonstrate synthesis of PT education.   Goal status:  INITIAL  2.  Pt will be able to demonstrate FWB and normalized gait in order to demonstrate functional improvement in LE function for self-care and house hold duties.   Goal status: INITIAL  3.  Pt will score at least 12 pt increase on FOTO to demonstrate functional improvement in MCII and pt perceived function.     Goal status: INITIAL    LONG TERM GOALS: Target date: 10/16/2021   Pt  will become independent with final HEP in order to demonstrate synthesis of PT education.   Goal status: INITIAL  2.  Pt will score >/= 65 on FOTO to demonstrate improvement in perceived R LE function.   Goal status: INITIAL  3.  Pt will be able to demonstrate/report ability to walk >30 mins without pain in order to demonstrate functional improvement and tolerance to exercise and community mobility.    Goal status: INITIAL  4.  Pt will be able to demonstrate ability to perform 20 consecutive SL calf raise in order to demonstrate functional improvement in LE function for functional improvement in SL strength and stability for return to PLOF.  Goal status: INITIAL   PLAN: PT FREQUENCY: 1-2x/week  PT DURATION: 12 weeks  PLANNED INTERVENTIONS: Therapeutic exercises, Therapeutic activity, Neuromuscular re-education, Balance training, Gait training, Patient/Family education, Joint manipulation, Joint mobilization, Stair training, Orthotic/Fit training, DME instructions, Aquatic Therapy, Dry Needling, Electrical stimulation, Spinal manipulation, Spinal mobilization, Cryotherapy, Moist heat, Compression bandaging, scar mobilization, Splintting, Taping, Vasopneumatic device, Traction, Ultrasound, Ionotophoresis '4mg'$ /ml Dexamethasone, Manual therapy, and Re-evaluation  PLAN FOR NEXT SESSION: progress as able and tolerated, WBAT in boot per Dr. Sherryle Lis BUT no WB exercises in PT until next week    Daleen Bo PT, DPT 08/07/21 10:18 AM

## 2021-08-08 ENCOUNTER — Encounter: Payer: Self-pay | Admitting: Podiatry

## 2021-08-12 ENCOUNTER — Encounter: Payer: Self-pay | Admitting: Dermatology

## 2021-08-12 NOTE — Progress Notes (Signed)
   Follow-Up Visit   Subjective  Kendra Norman is a 66 y.o. female who presents for the following: Annual Exam (Here for annual skin exam. Concerns left forearm. Rough area. 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  General skin examination, several areas of concern.  History of nonmelanoma skin cancer face. Location:  Duration:  Quality:  Associated Signs/Symptoms: Modifying Factors:  Severity:  Timing: Context:   Objective  Well appearing patient in no apparent distress; mood and affect are within normal limits. Full body skin exam.  No atypical pigmented lesions, will check with dermoscopy.  3 mm waxy pink flat papule on right thigh which may be recent onset, photo taken of right thigh. Will follow up if necessary.      Left Forearm - Anterior Gritty 4 mm pink crust  Left Abdomen (side) - Upper Several 1 to 3 mm smooth red dermal papules  Mid Back Noninflamed brown 4 to 8 mm flattopped textured papules    A full examination was performed including scalp, head, eyes, ears, nose, lips, neck, chest, axillae, abdomen, back, buttocks, bilateral upper extremities, bilateral lower extremities, hands, feet, fingers, toes, fingernails, and toenails. All findings within normal limits unless otherwise noted below.  Areas beneath undergarments not fully examined.   Assessment & Plan    AK (actinic keratosis) Left Forearm - Anterior  Destruction of lesion - Left Forearm - Anterior Complexity: simple   Destruction method: cryotherapy   Informed consent: discussed and consent obtained   Timeout:  patient name, date of birth, surgical site, and procedure verified Lesion destroyed using liquid nitrogen: Yes   Cryotherapy cycles:  3 Outcome: patient tolerated procedure well with no complications   Post-procedure details: wound care instructions given    Hemangioma of skin Left Abdomen (side) - Upper  No intervention necessary  Screening for malignant neoplasm of skin  Yearly skin  exam.  Seborrheic keratoses Mid Back  Leave if stable      I, [name], MD, have reviewed all documentation for this visit.  The documentation on 08/12/21 for the exam, diagnosis, procedures, and orders are all accurate and complete.

## 2021-08-21 ENCOUNTER — Ambulatory Visit (HOSPITAL_BASED_OUTPATIENT_CLINIC_OR_DEPARTMENT_OTHER): Payer: Medicare Other | Admitting: Physical Therapy

## 2021-08-21 ENCOUNTER — Encounter (HOSPITAL_BASED_OUTPATIENT_CLINIC_OR_DEPARTMENT_OTHER): Payer: Self-pay | Admitting: Physical Therapy

## 2021-08-21 ENCOUNTER — Encounter: Payer: Self-pay | Admitting: Podiatry

## 2021-08-21 DIAGNOSIS — M25671 Stiffness of right ankle, not elsewhere classified: Secondary | ICD-10-CM

## 2021-08-21 DIAGNOSIS — S86011A Strain of right Achilles tendon, initial encounter: Secondary | ICD-10-CM | POA: Diagnosis not present

## 2021-08-21 DIAGNOSIS — R262 Difficulty in walking, not elsewhere classified: Secondary | ICD-10-CM

## 2021-08-21 DIAGNOSIS — M6281 Muscle weakness (generalized): Secondary | ICD-10-CM

## 2021-08-21 DIAGNOSIS — M25571 Pain in right ankle and joints of right foot: Secondary | ICD-10-CM

## 2021-08-21 NOTE — Therapy (Signed)
OUTPATIENT PHYSICAL TREATMENT NOTE   Patient Name: Kendra Norman MRN: [id number] DOB:17-Dec-1955, 66 y.o., female Today's Date: 08/21/2021   PT End of Session - 08/21/21 0949     Visit Number 5    Number of Visits 25    Date for PT Re-Evaluation 10/22/21    Authorization Type MCR    Progress Note Due on Visit 10    PT Start Time 0930    PT Stop Time 1010    PT Time Calculation (min) 40 min    Equipment Utilized During Treatment Gait belt    Activity Tolerance Patient tolerated treatment well    Behavior During Therapy WFL for tasks assessed/performed                 Past Medical History:  Diagnosis Date   GERD (gastroesophageal reflux disease)    Seasonal allergies    Past Surgical History:  Procedure Laterality Date   ABDOMINAL HYSTERECTOMY     BREAST BIOPSY     BREAST EXCISIONAL BIOPSY Bilateral    fibroadenomas - benign   BREAST LUMPECTOMY Bilateral    fibroadenomas - benign   VEIN SURGERY     Patient Active Problem List   Diagnosis Date Noted   Strain of muscle, fascia and tendon of lower back, initial encounter 10/16/2018    PCP: Aletha Halim., PA-C  REFERRING PROVIDER: Criselda Peaches, DPM  REFERRING DIAG: [phone] (ICD-10-CM) - Rupture of right Achilles tendon, initial encounter  THERAPY DIAG:  Pain in right ankle and joints of right foot  Stiffness of right ankle, not elsewhere classified  Muscle weakness (generalized)  Difficulty walking  Rationale for Evaluation and Treatment Rehabilitation  ONSET DATE: 06/07/2021 REPAIR ACHILLES TENDON, REMOVAL OF BONE SPUR, CALF MUSCLE LENGTHENING, TENDON TRANSFER RIGHT  SUBJECTIVE:   SUBJECTIVE STATEMENT:  Pt states she is able to walk better now. She did have some heel pain with walking but MD's suggested heel cups have made a huge difference. She can walk without crutches at this time.   Eval:  Pt states in back in the Fall, she had some what she thought was plantar fasciitis. In January,  she had injections and was put into the put. In April, she had PRP. In May she was putting on pants and felt that the "ankle went." Balance on the R leg. She had surgery 5/17. Pt plays golf. She also does water running. Husband at home helping.   Pt states she walks a lot. She even walks 30 min at a time with the knee scooter. Pain is well managed. No pain since day 3 post op. Pt denies signs of DVT.   PAIN:  Are you having pain? No: NPRS scale: 0/10 Pain location: R ankle incision Pain description: sharp, NT on top of    PRECAUTIONS: Other: Achilles  Per MD: 06/07/21 Surgery. Begin PT after 07/24/21, NWB exercise on strength and ROM until 7/17, then begin WB exercises; update 7/12 per Dr. Sherryle Lis Epic secure chat WBAT in boot but no WB therapy exercises until next week   WEIGHT BEARING RESTRICTIONS Yes NWB  FALLS:  Has patient fallen in last 6 months? No  LIVING ENVIRONMENT: Lives with: lives with their spouse Lives in: House/apartment Stairs: Yes, steps to enter and 2nd floor home gym  Has following equipment at home: Gilford Rile - 2 wheeled, Crutches, and knee scooter  OCCUPATION: retired   PLOF: Independent, has membership aquatic center   PATIENT GOALS : Pt states she would like  to play golf and return exercising/walking/ bike/ pickleball   OBJECTIVE:   DIAGNOSTIC FINDINGS:   IMPRESSION: 1. Complete insertional rupture of the Achilles tendon with moderate retraction. Underlying Achilles tendinosis with reactive edema in the calcaneal tuberosity. 2. Mild attenuation of the flexor digitorum longus tendon within the plantar aspect of the midfoot without evidence of focal tear. The additional ankle tendons appear normal. 3. 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 4. Mild tibiotalar and midfoot degenerative changes.  PATIENT SURVEYS:  FOTO 54 51 DC 12 pts MCII   LOWER EXTREMITY ROM:  Active ROM Right eval R  7/10 7/17  Left eval  Ankle dorsiflexion -30 0 3 6  Ankle plantarflexion 50   55  Ankle inversion 12   30  Ankle eversion 8   20   (Blank rows = not tested)   TODAY'S TREATMENT  7/31  R TCJ post glide mob grade III-IV  Shuttle calf raise, double leg 31lbs 2x10 DL leg press shuttle 3x10 81 lbs  SL balance (attempted but unable) Sidestepping at bar YTB at ankle 3x laps Tandem balance 30s 3x 4 way ankle RTB 20x each Fig 4 Bridge 2x10 Standing DL calf raise with counter support 2x10   7/17  R TCJ post glide mob grade III-IV  Seated ankle DF stretch 10s 10x Standing weight shifting without boot 5s 10x Heel toe rocking with boot on 2x10  Gait training: Single crutch, boot on; focus on improving R stance time, decrease L trunk lean, possible L shoe lift to reduce L lean and back pain    08/02/21:  Stretches R gastroc to tolerance 5x30 seconds, joint mobs grade III all directions to tolerance   SLRs R LE 1x15 Sidelying Hip ABD 1x15 B Prone hip ABD 1x15 B  Review of toe yoga, also towel crunches, arch raises   Gait training WBAT in boot with B crutches Min cues for technique/sequencing, then stair training 2 steps in gym B crutches min guard for safety/Mod cues for sequencing       PATIENT EDUCATION:  Education details: AD sizing/ usage, anatomy, exercise progression, DOMS expectations, muscle firing,  envelope of function, HEP, POC  Person educated: Patient Education method: Explanation, Demonstration, Tactile cues, Verbal cues, and Handouts Education comprehension: verbalized understanding, returned demonstration, verbal cues required, and tactile cues required   HOME EXERCISE PROGRAM: Access Code: [email or web address] URL: [email or web address] Date: 07/24/2021 Prepared by: Daleen Bo  ASSESSMENT:  CLINICAL IMPRESSION:  Pt able to demo improved gait today without use of AD but does have continued limp on R that is likely due to PF strength deficit as well as R  ankle DF ROM deficit. Pt was able to progress WB exercise and strengthening today without pain or discomfort during. Pt does demo SL stability deficit and unable to stand in R LE. Plan to continue with strength and stability of R ankle as able.  Pt would benefit from continued skilled therapy in order to reach goals and maximize functional R LE strength and ROM for full return to PLOF.    OBJECTIVE IMPAIRMENTS Abnormal gait, decreased activity tolerance, decreased balance, decreased endurance, decreased knowledge of use of DME, decreased mobility, difficulty walking, decreased ROM, decreased strength, hypomobility, increased edema, increased fascial restrictions, impaired flexibility, impaired sensation, improper body mechanics, postural dysfunction, and pain.   ACTIVITY LIMITATIONS carrying, lifting, standing, squatting, stairs, transfers, bed mobility, bathing, toileting, dressing, locomotion level, and caring for others  PARTICIPATION LIMITATIONS: meal prep, cleaning, laundry, interpersonal  relationship, driving, shopping, community activity, yard work, and exercise  PERSONAL FACTORS Age  affecting patient's functional outcome.   REHAB POTENTIAL: Good  CLINICAL DECISION MAKING: Stable/uncomplicated  EVALUATION COMPLEXITY: Low   GOALS:   SHORT TERM GOALS: Target date: 09/04/2021  Pt will become independent with initial HEP in order to demonstrate synthesis of PT education.   Goal status: INITIAL  2.  Pt will be able to demonstrate FWB and normalized gait in order to demonstrate functional improvement in LE function for self-care and house hold duties.   Goal status: INITIAL  3.  Pt will score at least 12 pt increase on FOTO to demonstrate functional improvement in MCII and pt perceived function.     Goal status: INITIAL    LONG TERM GOALS: Target date: 10/16/2021   Pt  will become independent with final HEP in order to demonstrate synthesis of PT education.   Goal status:  INITIAL  2.  Pt will score >/= 65 on FOTO to demonstrate improvement in perceived R LE function.   Goal status: INITIAL  3.  Pt will be able to demonstrate/report ability to walk >30 mins without pain in order to demonstrate functional improvement and tolerance to exercise and community mobility.   Goal status: INITIAL  4.  Pt will be able to demonstrate ability to perform 20 consecutive SL calf raise in order to demonstrate functional improvement in LE function for functional improvement in SL strength and stability for return to PLOF.  Goal status: INITIAL   PLAN: PT FREQUENCY: 1-2x/week  PT DURATION: 12 weeks  PLANNED INTERVENTIONS: Therapeutic exercises, Therapeutic activity, Neuromuscular re-education, Balance training, Gait training, Patient/Family education, Joint manipulation, Joint mobilization, Stair training, Orthotic/Fit training, DME instructions, Aquatic Therapy, Dry Needling, Electrical stimulation, Spinal manipulation, Spinal mobilization, Cryotherapy, Moist heat, Compression bandaging, scar mobilization, Splintting, Taping, Vasopneumatic device, Traction, Ultrasound, Ionotophoresis '4mg'$ /ml Dexamethasone, Manual therapy, and Re-evaluation  PLAN FOR NEXT SESSION: progress as able and tolerated, WBAT in boot per Dr. Sherryle Lis BUT no WB exercises in PT until next week    Daleen Bo PT, DPT 08/21/21 10:14 AM

## 2021-08-23 ENCOUNTER — Ambulatory Visit (HOSPITAL_BASED_OUTPATIENT_CLINIC_OR_DEPARTMENT_OTHER): Payer: Medicare Other | Attending: Podiatry | Admitting: Physical Therapy

## 2021-08-23 ENCOUNTER — Encounter (HOSPITAL_BASED_OUTPATIENT_CLINIC_OR_DEPARTMENT_OTHER): Payer: Self-pay | Admitting: Physical Therapy

## 2021-08-23 DIAGNOSIS — M6281 Muscle weakness (generalized): Secondary | ICD-10-CM | POA: Insufficient documentation

## 2021-08-23 DIAGNOSIS — M25571 Pain in right ankle and joints of right foot: Secondary | ICD-10-CM | POA: Insufficient documentation

## 2021-08-23 DIAGNOSIS — M25671 Stiffness of right ankle, not elsewhere classified: Secondary | ICD-10-CM | POA: Insufficient documentation

## 2021-08-23 DIAGNOSIS — R262 Difficulty in walking, not elsewhere classified: Secondary | ICD-10-CM | POA: Diagnosis present

## 2021-08-23 NOTE — Therapy (Signed)
OUTPATIENT PHYSICAL TREATMENT NOTE   Patient Name: Kendra Norman MRN: [id number] DOB:[birth year], 66 y.o., female Today's Date: 08/23/2021   PT End of Session - 08/23/21 0946     Visit Number 6    Number of Visits 25    Date for PT Re-Evaluation 10/22/21    Authorization Type MCR    Progress Note Due on Visit 10    PT Start Time 1000    PT Stop Time 1040    PT Time Calculation (min) 40 min    Equipment Utilized During Treatment Gait belt    Activity Tolerance Patient tolerated treatment well    Behavior During Therapy WFL for tasks assessed/performed                 Past Medical History:  Diagnosis Date   GERD (gastroesophageal reflux disease)    Seasonal allergies    Past Surgical History:  Procedure Laterality Date   ABDOMINAL HYSTERECTOMY     BREAST BIOPSY     BREAST EXCISIONAL BIOPSY Bilateral    fibroadenomas - benign   BREAST LUMPECTOMY Bilateral    fibroadenomas - benign   VEIN SURGERY     Patient Active Problem List   Diagnosis Date Noted   Strain of muscle, fascia and tendon of lower back, initial encounter 10/16/2018    PCP: Aletha Halim., PA-C  REFERRING PROVIDER: Criselda Peaches, DPM  REFERRING DIAG: [phone] (ICD-10-CM) - Rupture of right Achilles tendon, initial encounter  THERAPY DIAG:  Pain in right ankle and joints of right foot  Stiffness of right ankle, not elsewhere classified  Muscle weakness (generalized)  Difficulty walking  Rationale for Evaluation and Treatment Rehabilitation  ONSET DATE: 06/07/2021 REPAIR ACHILLES TENDON, REMOVAL OF BONE SPUR, CALF MUSCLE LENGTHENING, TENDON TRANSFER RIGHT  SUBJECTIVE:   SUBJECTIVE STATEMENT:  Pt states her foot is very sore. Pt states it was across the top of the foot and into the heel.  Eval:  Pt states in back in the Fall, she had some what she thought was plantar fasciitis. In January, she had injections and was put into the put. In April, she had PRP. In May she was putting  on pants and felt that the "ankle went." Balance on the R leg. She had surgery 5/17. Pt plays golf. She also does water running. Husband at home helping.   Pt states she walks a lot. She even walks 30 min at a time with the knee scooter. Pain is well managed. No pain since day 3 post op. Pt denies signs of DVT.   PAIN:  Are you having pain? No: NPRS scale: 0/10 Pain location: R ankle incision Pain description: sharp, NT on top of    PRECAUTIONS: Other: Achilles  Per MD: 06/07/21 Surgery. Begin PT after 07/24/21, NWB exercise on strength and ROM until 7/17, then begin WB exercises; update 7/12 per Dr. Sherryle Lis Epic secure chat WBAT in boot but no WB therapy exercises until next week   WEIGHT BEARING RESTRICTIONS Yes NWB  FALLS:  Has patient fallen in last 6 months? No  LIVING ENVIRONMENT: Lives with: lives with their spouse Lives in: House/apartment Stairs: Yes, steps to enter and 2nd floor home gym  Has following equipment at home: Gilford Rile - 2 wheeled, Crutches, and knee scooter  OCCUPATION: retired   PLOF: Independent, has [employer] [facility]   PATIENT GOALS : Pt states she would like to play golf and return exercising/walking/ bike/ pickleball   OBJECTIVE:   DIAGNOSTIC FINDINGS:  IMPRESSION: 1. Complete insertional rupture of the Achilles tendon with moderate retraction. Underlying Achilles tendinosis with reactive edema in the calcaneal tuberosity. 2. Mild attenuation of the flexor digitorum longus tendon within the plantar aspect of the midfoot without evidence of focal tear. The additional ankle tendons appear normal. 3. 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 4. Mild tibiotalar and midfoot degenerative changes.  PATIENT SURVEYS:  FOTO 54 65 DC 12 pts MCII   LOWER EXTREMITY ROM:  Active ROM Right eval R  7/10 7/17 Left eval  Ankle dorsiflexion -30 0 3 6  Ankle plantarflexion 50   55  Ankle inversion 12    30  Ankle eversion 8   20   (Blank rows = not tested)   TODAY'S TREATMENT  8/2  Pt seen for aquatic therapy today.  Treatment took place in water 3.25-4 ft in depth at the [facility] pool. Temp of water was 91.  Pt entered/exited the pool via stairs  independently with bilat rail. (Step to)    Warm up: 3x sidestepping, retro walking, fwd walking    Exercises: Marching 3x widths Standing gastroc stretch 30s 3x Tandem walking 3x widths SL balance 30s 3x HR/TR 2x20 without UE Standing hip ABD 2x10 bilat intermittent UE Mini squat without UE 2x20 Fwd lunge 2x10 with UE    Pt requires buoyancy for support and to offload joints with strengthening exercises. Viscosity of the water is needed for resistance of strengthening; water current perturbations provides challenge to standing balance unsupported, requiring increased core activation.  7/31  R TCJ post glide mob grade III-IV  Shuttle calf raise, double leg 31lbs 2x10 DL leg press shuttle 3x10 81 lbs  SL balance (attempted but unable) Sidestepping at bar YTB at ankle 3x laps Tandem balance 30s 3x 4 way ankle RTB 20x each Fig 4 Bridge 2x10 Standing DL calf raise with counter support 2x10   7/17  R TCJ post glide mob grade III-IV  Seated ankle DF stretch 10s 10x Standing weight shifting without boot 5s 10x Heel toe rocking with boot on 2x10  Gait training: Single crutch, boot on; focus on improving R stance time, decrease L trunk lean, possible L shoe lift to reduce L lean and back pain    08/02/21:  Stretches R gastroc to tolerance 5x30 seconds, joint mobs grade III all directions to tolerance   SLRs R LE 1x15 Sidelying Hip ABD 1x15 B Prone hip ABD 1x15 B  Review of toe yoga, also towel crunches, arch raises   Gait training WBAT in boot with B crutches Min cues for technique/sequencing, then stair training 2 steps in gym B crutches min guard for safety/Mod cues for sequencing       PATIENT  EDUCATION:  Education details:  aquatic properties, DOMS expectations, muscle firing,  envelope of function, HEP, POC  Person educated: Patient Education method: Explanation, Demonstration, Tactile cues, Verbal cues, and Handouts Education comprehension: verbalized understanding, returned demonstration, verbal cues required, and tactile cues required   HOME EXERCISE PROGRAM: Access Code: [email or web address] URL: [email or web address] Date: 07/24/2021 Prepared by: Daleen Bo  ASSESSMENT:  CLINICAL IMPRESSION:  Given report of increased soreness, pt advised on reducing frequency of WB exercise to every other day or 3x/week. Pt with very good tolerance today with aquatic session. Pt without pain throughout session. BW maintained at 50% or lower. Pt able to continue with CKC ankle strength and stability today without increase in pain. Plan to continue with aquatic facilitate ankle  strength and tolerance to upright loading. Consider addition of step ups at next. Pt to start with self pool exercise outside of therapy. Pt advised to avoid any heavy loading, impact activity, and any activity at higher rates of power/speed.  Pt would benefit from continued skilled therapy in order to reach goals and maximize functional R LE strength and ROM for full return to PLOF.    OBJECTIVE IMPAIRMENTS Abnormal gait, decreased activity tolerance, decreased balance, decreased endurance, decreased knowledge of use of DME, decreased mobility, difficulty walking, decreased ROM, decreased strength, hypomobility, increased edema, increased fascial restrictions, impaired flexibility, impaired sensation, improper body mechanics, postural dysfunction, and pain.   ACTIVITY LIMITATIONS carrying, lifting, standing, squatting, stairs, transfers, bed mobility, bathing, toileting, dressing, locomotion level, and caring for others  PARTICIPATION LIMITATIONS: meal prep, cleaning, laundry, interpersonal relationship, driving,  shopping, community activity, yard work, and exercise  PERSONAL FACTORS Age  affecting patient's functional outcome.   REHAB POTENTIAL: Good  CLINICAL DECISION MAKING: Stable/uncomplicated  EVALUATION COMPLEXITY: Low   GOALS:   SHORT TERM GOALS: Target date: 09/04/2021  Pt will become independent with initial HEP in order to demonstrate synthesis of PT education.   Goal status: INITIAL  2.  Pt will be able to demonstrate FWB and normalized gait in order to demonstrate functional improvement in LE function for self-care and house hold duties.   Goal status: INITIAL  3.  Pt will score at least 12 pt increase on FOTO to demonstrate functional improvement in MCII and pt perceived function.     Goal status: INITIAL    LONG TERM GOALS: Target date: 10/16/2021   Pt  will become independent with final HEP in order to demonstrate synthesis of PT education.   Goal status: INITIAL  2.  Pt will score >/= 65 on FOTO to demonstrate improvement in perceived R LE function.   Goal status: INITIAL  3.  Pt will be able to demonstrate/report ability to walk >30 mins without pain in order to demonstrate functional improvement and tolerance to exercise and community mobility.   Goal status: INITIAL  4.  Pt will be able to demonstrate ability to perform 20 consecutive SL calf raise in order to demonstrate functional improvement in LE function for functional improvement in SL strength and stability for return to PLOF.  Goal status: INITIAL   PLAN: PT FREQUENCY: 1-2x/week  PT DURATION: 12 weeks  PLANNED INTERVENTIONS: Therapeutic exercises, Therapeutic activity, Neuromuscular re-education, Balance training, Gait training, Patient/Family education, Joint manipulation, Joint mobilization, Stair training, Orthotic/Fit training, DME instructions, Aquatic Therapy, Dry Needling, Electrical stimulation, Spinal manipulation, Spinal mobilization, Cryotherapy, Moist heat, Compression bandaging, scar  mobilization, Splintting, Taping, Vasopneumatic device, Traction, Ultrasound, Ionotophoresis '4mg'$ /ml Dexamethasone, Manual therapy, and Re-evaluation  PLAN FOR NEXT SESSION: progress as able and tolerated  Daleen Bo PT, DPT 08/23/21 10:47 AM

## 2021-08-28 ENCOUNTER — Encounter (HOSPITAL_BASED_OUTPATIENT_CLINIC_OR_DEPARTMENT_OTHER): Payer: Self-pay | Admitting: Physical Therapy

## 2021-08-28 ENCOUNTER — Ambulatory Visit (HOSPITAL_BASED_OUTPATIENT_CLINIC_OR_DEPARTMENT_OTHER): Payer: Medicare Other | Admitting: Physical Therapy

## 2021-08-28 DIAGNOSIS — R262 Difficulty in walking, not elsewhere classified: Secondary | ICD-10-CM

## 2021-08-28 DIAGNOSIS — M25571 Pain in right ankle and joints of right foot: Secondary | ICD-10-CM | POA: Diagnosis not present

## 2021-08-28 DIAGNOSIS — M6281 Muscle weakness (generalized): Secondary | ICD-10-CM

## 2021-08-28 DIAGNOSIS — M25671 Stiffness of right ankle, not elsewhere classified: Secondary | ICD-10-CM

## 2021-08-28 NOTE — Therapy (Signed)
OUTPATIENT PHYSICAL TREATMENT NOTE   Patient Name: Kendra Norman MRN: [id number] DOB:1955/08/15, 66 y.o., female Today's Date: 08/28/2021   PT End of Session - 08/28/21 0918     Visit Number 7    Number of Visits 25    Date for PT Re-Evaluation 10/22/21    Authorization Type MCR    Progress Note Due on Visit 10    PT Start Time 0925    PT Stop Time 1005    PT Time Calculation (min) 40 min    Equipment Utilized During Treatment Gait belt    Activity Tolerance Patient tolerated treatment well    Behavior During Therapy WFL for tasks assessed/performed                 Past Medical History:  Diagnosis Date   GERD (gastroesophageal reflux disease)    Seasonal allergies    Past Surgical History:  Procedure Laterality Date   ABDOMINAL HYSTERECTOMY     BREAST BIOPSY     BREAST EXCISIONAL BIOPSY Bilateral    fibroadenomas - benign   BREAST LUMPECTOMY Bilateral    fibroadenomas - benign   VEIN SURGERY     Patient Active Problem List   Diagnosis Date Noted   Strain of muscle, fascia and tendon of lower back, initial encounter 10/16/2018    PCP: Aletha Halim., PA-C  REFERRING PROVIDER: Criselda Peaches, DPM  REFERRING DIAG: [phone] (ICD-10-CM) - Rupture of right Achilles tendon, initial encounter  THERAPY DIAG:  Pain in right ankle and joints of right foot  Stiffness of right ankle, not elsewhere classified  Muscle weakness (generalized)  Difficulty walking  Rationale for Evaluation and Treatment Rehabilitation  ONSET DATE: 06/07/2021 REPAIR ACHILLES TENDON, REMOVAL OF BONE SPUR, CALF MUSCLE LENGTHENING, TENDON TRANSFER RIGHT  SUBJECTIVE:   SUBJECTIVE STATEMENT:  Pt states that pain is now longer in the heel but into the medial and lateral ankle. Pt states there are muscle spasms at the end of the day.   Eval:  Pt states in back in the Fall, she had some what she thought was plantar fasciitis. In January, she had injections and was put into the  put. In April, she had PRP. In May she was putting on pants and felt that the "ankle went." Balance on the R leg. She had surgery 5/17. Pt plays golf. She also does water running. Husband at home helping.   Pt states she walks a lot. She even walks 30 min at a time with the knee scooter. Pain is well managed. No pain since day 3 post op. Pt denies signs of DVT.   PAIN:  Are you having pain? No: NPRS scale: 0/10 Pain location: R ankle incision Pain description: sharp, NT on top of    PRECAUTIONS: Other: Achilles  Per MD: 06/07/21 Surgery. Begin PT after 07/24/21, NWB exercise on strength and ROM until 7/17, then begin WB exercises; update 7/12 per Dr. Sherryle Lis Epic secure chat WBAT in boot but no WB therapy exercises until next week   WEIGHT BEARING RESTRICTIONS Yes NWB  FALLS:  Has patient fallen in last 6 months? No  LIVING ENVIRONMENT: Lives with: lives with their spouse Lives in: House/apartment Stairs: Yes, steps to enter and 2nd floor home gym  Has following equipment at home: Gilford Rile - 2 wheeled, Crutches, and knee scooter  OCCUPATION: retired   PLOF: Independent, has membership aquatic center   PATIENT GOALS : Pt states she would like to play golf and return exercising/walking/  bike/ pickleball   OBJECTIVE:   DIAGNOSTIC FINDINGS:   IMPRESSION: 1. Complete insertional rupture of the Achilles tendon with moderate retraction. Underlying Achilles tendinosis with reactive edema in the calcaneal tuberosity. 2. Mild attenuation of the flexor digitorum longus tendon within the plantar aspect of the midfoot without evidence of focal tear. The additional ankle tendons appear normal. 3. 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 4. Mild tibiotalar and midfoot degenerative changes.  PATIENT SURVEYS:  FOTO 54 65 DC 12 pts MCII   LOWER EXTREMITY ROM:  Active ROM Right eval R  7/10 7/17 Left eval  Ankle dorsiflexion -30 0 3 6   Ankle plantarflexion 50   55  Ankle inversion 12   30  Ankle eversion 8   20   (Blank rows = not tested)   TODAY'S TREATMENT  8/7  Pt seen for aquatic therapy today.  Treatment took place in water 3.25-4 ft in depth at the [facility] pool. Temp of water was 91.  Pt entered/exited the pool via stairs  independently with bilat rail. (Step to)    Warm up: 3x fwd, lateral, and retro marching   Exercises: Heel cord stretch at steps 15x 4x  Step ups lateral and fwd at bottom step 2x10 (focus on slow lower, weight through middle of foot)  Toe walking 3x laps (xiphoid process deep) Tandem walking 3x widths SL balance 30s 3x SL HR in chest deep 2x20 Standing hip ABD 2x20 bilat intermittent UE Mini squat on bottom step 3x10 Golf swing rotation with R LE push off 20x Black TB Paloff press 2x10 Fwd lunge 2x10 without UE    Pt requires buoyancy for support and to offload joints with strengthening exercises. Viscosity of the water is needed for resistance of strengthening; water current perturbations provides challenge to standing balance unsupported, requiring increased core activation.  8/2  Pt seen for aquatic therapy today.  Treatment took place in water 3.25-4 ft in depth at the [facility] pool. Temp of water was 91.  Pt entered/exited the pool via stairs  independently with bilat rail. (Step to)    Warm up: 3x sidestepping, retro walking, fwd walking    Exercises: Marching 3x widths Standing gastroc stretch 30s 3x Tandem walking 3x widths SL balance 30s 3x HR/TR 2x20 without UE Standing hip ABD 2x10 bilat intermittent UE Mini squat without UE 2x20 Fwd lunge 2x10 with UE    Pt requires buoyancy for support and to offload joints with strengthening exercises. Viscosity of the water is needed for resistance of strengthening; water current perturbations provides challenge to standing balance unsupported, requiring increased core activation.  7/31  R TCJ  post glide mob grade III-IV  Shuttle calf raise, double leg 31lbs 2x10 DL leg press shuttle 3x10 81 lbs  SL balance (attempted but unable) Sidestepping at bar YTB at ankle 3x laps Tandem balance 30s 3x 4 way ankle RTB 20x each Fig 4 Bridge 2x10 Standing DL calf raise with counter support 2x10   7/17  R TCJ post glide mob grade III-IV  Seated ankle DF stretch 10s 10x Standing weight shifting without boot 5s 10x Heel toe rocking with boot on 2x10  Gait training: Single crutch, boot on; focus on improving R stance time, decrease L trunk lean, possible L shoe lift to reduce L lean and back pain    08/02/21:  Stretches R gastroc to tolerance 5x30 seconds, joint mobs grade III all directions to tolerance   SLRs R  LE 1x15 Sidelying Hip ABD 1x15 B Prone hip ABD 1x15 B  Review of toe yoga, also towel crunches, arch raises   Gait training WBAT in boot with B crutches Min cues for technique/sequencing, then stair training 2 steps in gym B crutches min guard for safety/Mod cues for sequencing       PATIENT EDUCATION:  Education details:  aquatic properties, DOMS expectations, muscle firing,  envelope of function, HEP, POC  Person educated: Patient Education method: Explanation, Demonstration, Tactile cues, Verbal cues, and Handouts Education comprehension: verbalized understanding, returned demonstration, verbal cues required, and tactile cues required   HOME EXERCISE PROGRAM: Access Code: [email or web address] URL: [email or web address] Date: 07/24/2021 Prepared by: Daleen Bo  ASSESSMENT:  CLINICAL IMPRESSION:  Pt able to continue with dynamic and static loading of the R ankle today without pain. Pt able to continue with R ankle/foot strength and endurance with exacerbation of pain. Pt still with R ankle stiffness as expected but able to progress to SL loading and stability. Plan to progress land based exercise at next session. Pt advised to avoid any heavy loading,  impact activity, and any activity at higher rates of power/speed.  Pt would benefit from continued skilled therapy in order to reach goals and maximize functional R LE strength and ROM for full return to PLOF.    OBJECTIVE IMPAIRMENTS Abnormal gait, decreased activity tolerance, decreased balance, decreased endurance, decreased knowledge of use of DME, decreased mobility, difficulty walking, decreased ROM, decreased strength, hypomobility, increased edema, increased fascial restrictions, impaired flexibility, impaired sensation, improper body mechanics, postural dysfunction, and pain.   ACTIVITY LIMITATIONS carrying, lifting, standing, squatting, stairs, transfers, bed mobility, bathing, toileting, dressing, locomotion level, and caring for others  PARTICIPATION LIMITATIONS: meal prep, cleaning, laundry, interpersonal relationship, driving, shopping, community activity, yard work, and exercise  PERSONAL FACTORS Age  affecting patient's functional outcome.   REHAB POTENTIAL: Good  CLINICAL DECISION MAKING: Stable/uncomplicated  EVALUATION COMPLEXITY: Low   GOALS:   SHORT TERM GOALS: Target date: 09/04/2021  Pt will become independent with initial HEP in order to demonstrate synthesis of PT education.   Goal status: INITIAL  2.  Pt will be able to demonstrate FWB and normalized gait in order to demonstrate functional improvement in LE function for self-care and house hold duties.   Goal status: INITIAL  3.  Pt will score at least 12 pt increase on FOTO to demonstrate functional improvement in MCII and pt perceived function.     Goal status: INITIAL    LONG TERM GOALS: Target date: 10/16/2021   Pt  will become independent with final HEP in order to demonstrate synthesis of PT education.   Goal status: INITIAL  2.  Pt will score >/= 65 on FOTO to demonstrate improvement in perceived R LE function.   Goal status: INITIAL  3.  Pt will be able to demonstrate/report ability to  walk >30 mins without pain in order to demonstrate functional improvement and tolerance to exercise and community mobility.   Goal status: INITIAL  4.  Pt will be able to demonstrate ability to perform 20 consecutive SL calf raise in order to demonstrate functional improvement in LE function for functional improvement in SL strength and stability for return to PLOF.  Goal status: INITIAL   PLAN: PT FREQUENCY: 1-2x/week  PT DURATION: 12 weeks  PLANNED INTERVENTIONS: Therapeutic exercises, Therapeutic activity, Neuromuscular re-education, Balance training, Gait training, Patient/Family education, Joint manipulation, Joint mobilization, Stair training, Orthotic/Fit training, DME instructions, Aquatic Therapy, Dry Needling, Electrical stimulation,  Spinal manipulation, Spinal mobilization, Cryotherapy, Moist heat, Compression bandaging, scar mobilization, Splintting, Taping, Vasopneumatic device, Traction, Ultrasound, Ionotophoresis '4mg'$ /ml Dexamethasone, Manual therapy, and Re-evaluation  PLAN FOR NEXT SESSION: progress strength as able and tolerated  Daleen Bo PT, DPT 08/28/21 10:08 AM

## 2021-08-29 ENCOUNTER — Ambulatory Visit (INDEPENDENT_AMBULATORY_CARE_PROVIDER_SITE_OTHER): Payer: Medicare Other | Admitting: Podiatry

## 2021-08-29 DIAGNOSIS — M9261 Juvenile osteochondrosis of tarsus, right ankle: Secondary | ICD-10-CM

## 2021-08-29 DIAGNOSIS — M21861 Other specified acquired deformities of right lower leg: Secondary | ICD-10-CM

## 2021-08-29 DIAGNOSIS — S86011A Strain of right Achilles tendon, initial encounter: Secondary | ICD-10-CM

## 2021-08-30 ENCOUNTER — Ambulatory Visit (HOSPITAL_BASED_OUTPATIENT_CLINIC_OR_DEPARTMENT_OTHER): Payer: Medicare Other | Admitting: Physical Therapy

## 2021-08-30 ENCOUNTER — Encounter (HOSPITAL_BASED_OUTPATIENT_CLINIC_OR_DEPARTMENT_OTHER): Payer: Self-pay | Admitting: Physical Therapy

## 2021-08-30 DIAGNOSIS — R262 Difficulty in walking, not elsewhere classified: Secondary | ICD-10-CM

## 2021-08-30 DIAGNOSIS — M25571 Pain in right ankle and joints of right foot: Secondary | ICD-10-CM

## 2021-08-30 DIAGNOSIS — M6281 Muscle weakness (generalized): Secondary | ICD-10-CM

## 2021-08-30 DIAGNOSIS — M25671 Stiffness of right ankle, not elsewhere classified: Secondary | ICD-10-CM

## 2021-08-30 NOTE — Progress Notes (Signed)
  Subjective:  Patient ID: Kendra Norman, female    DOB: [birth year],  MRN: [id number]  Chief Complaint  Patient presents with   Routine Post Op      6 week follow up POV #4 DOS 06/07/2021 REPARI ACHILLES TENDON, REMOVAL OF BONE SPUR, CALF MUSCLE LENGTHENING, TENDON TRANSFER RIGHT - overall doing really well. She has some pain when she is on her foot for extended periods of time - has developed some scar tissue that rubs in her shoe     66 y.o. female returns for post-op check.  Overall doing well, physical therapy is helping quite a bit she still using a single crutch here and there  Review of Systems: Negative except as noted in the HPI. Denies N/V/F/Ch.   Objective:  There were no vitals filed for this visit. There is no height or weight on file to calculate BMI. Constitutional Well developed. Well nourished.  Vascular Foot warm and well perfused. Capillary refill normal to all digits.  Calf is soft and supple, no posterior calf or knee pain, negative Homans' sign  Neurologic Normal speech. Oriented to person, place, and time. Epicritic sensation to light touch grossly present bilaterally.  Dermatologic Incision is well-healed  Orthopedic: Little to no tenderness to palpation noted about the surgical site.  Edema is minimal.  Good 5 out of 5 strength, some limited range of motion in dorsiflexion   Multiple view plain film radiographs: Interval resection of posterior calcaneal enthesophyte repair of Achilles rupture and Haglund deformity, ghost tracks for anchors and tendon transfer noted Assessment:   1. Rupture of right Achilles tendon, initial encounter   2. Gastrocnemius equinus of right lower extremity   3. Haglund's deformity, right    Plan:  Patient was evaluated and treated and all questions answered.  S/p foot surgery right -Doing very well.  She may begin to transition gradually out of the cam boot into regular supportive shoe gear.  May resume low impact exercise at  this point.  I discussed with her as soon as she is able to walk comfortably in her shoe and her strength is improving she may resume playing golf.  She will work on this with her therapist.  Avoid impact exercise until 6 months after surgery.  I will see her back in about 12 weeks for reevaluation prior to this time point.  Return in about 12 weeks (around 11/21/2021) for Follow up from Achilles surgery .

## 2021-08-30 NOTE — Therapy (Signed)
OUTPATIENT PHYSICAL TREATMENT NOTE   Patient Name: Kendra Norman MRN: [id number] DOB:[birth year], 66 y.o., female Today's Date: 08/30/2021   PT End of Session - 08/30/21 0928     Visit Number 8    Number of Visits 25    Date for PT Re-Evaluation 10/22/21    Authorization Type MCR    Progress Note Due on Visit 10    PT Start Time 0930    PT Stop Time 1010    PT Time Calculation (min) 40 min    Equipment Utilized During Treatment Gait belt    Activity Tolerance Patient tolerated treatment well    Behavior During Therapy WFL for tasks assessed/performed                 Past Medical History:  Diagnosis Date   GERD (gastroesophageal reflux disease)    Seasonal allergies    Past Surgical History:  Procedure Laterality Date   ABDOMINAL HYSTERECTOMY     BREAST BIOPSY     BREAST EXCISIONAL BIOPSY Bilateral    fibroadenomas - benign   BREAST LUMPECTOMY Bilateral    fibroadenomas - benign   VEIN SURGERY     Patient Active Problem List   Diagnosis Date Noted   Strain of muscle, fascia and tendon of lower back, initial encounter 10/16/2018    PCP: Aletha Halim., PA-C  REFERRING PROVIDER: Criselda Peaches, DPM  REFERRING DIAG: [phone] (ICD-10-CM) - Rupture of right Achilles tendon, initial encounter  THERAPY DIAG:  Pain in right ankle and joints of right foot  Stiffness of right ankle, not elsewhere classified  Muscle weakness (generalized)  Difficulty walking  Rationale for Evaluation and Treatment Rehabilitation  ONSET DATE: 06/07/2021 REPAIR ACHILLES TENDON, REMOVAL OF BONE SPUR, CALF MUSCLE LENGTHENING, TENDON TRANSFER RIGHT  SUBJECTIVE:   SUBJECTIVE STATEMENT:  Pt states that the ankle is doing well. She is not sore after last session. Pt states that she saw MD and states that everything looks good and headed in the right direction.   Eval:  Pt states in back in the Fall, she had some what she thought was plantar fasciitis. In January, she had  injections and was put into the put. In April, she had PRP. In May she was putting on pants and felt that the "ankle went." Balance on the R leg. She had surgery 5/17. Pt plays golf. She also does water running. Husband at home helping.   Pt states she walks a lot. She even walks 30 min at a time with the knee scooter. Pain is well managed. No pain since day 3 post op. Pt denies signs of DVT.   PAIN:  Are you having pain? No: NPRS scale: 0/10 Pain location: R ankle incision Pain description: sharp, NT on top of    PRECAUTIONS: Other: Achilles  Per MD: 06/07/21 Surgery. Begin PT after 07/24/21, NWB exercise on strength and ROM until 7/17, then begin WB exercises; update 7/12 per Dr. Sherryle Lis Epic secure chat WBAT in boot but no WB therapy exercises until next week   WEIGHT BEARING RESTRICTIONS Yes NWB  FALLS:  Has patient fallen in last 6 months? No  LIVING ENVIRONMENT: Lives with: lives with their spouse Lives in: House/apartment Stairs: Yes, steps to enter and 2nd floor home gym  Has following equipment at home: Gilford Rile - 2 wheeled, Crutches, and knee scooter  OCCUPATION: retired   PLOF: Independent, has membership aquatic center   PATIENT GOALS : Pt states she would like to play  golf and return exercising/walking/ bike/ pickleball   OBJECTIVE:   DIAGNOSTIC FINDINGS:   IMPRESSION: 1. Complete insertional rupture of the Achilles tendon with moderate retraction. Underlying Achilles tendinosis with reactive edema in the calcaneal tuberosity. 2. Mild attenuation of the flexor digitorum longus tendon within the plantar aspect of the midfoot without evidence of focal tear. The additional ankle tendons appear normal. 3. 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 4. Mild tibiotalar and midfoot degenerative changes.  PATIENT SURVEYS:  FOTO 54 65 DC 12 pts MCII   LOWER EXTREMITY ROM:  Active ROM Right eval R  7/10 7/17 Left eval   Ankle dorsiflexion -30 0 3 6  Ankle plantarflexion 50   55  Ankle inversion 12   30  Ankle eversion 8   20   (Blank rows = not tested)   TODAY'S TREATMENT  8/9   R TCJ post glide mob grade III-IV IASTM R achilles scar   Shuttle calf raise, staggered stance 50lbs 2x10 2s hold DL shuttle press 87lbs 3x10 SL balance 30s 4x Gastroc stretch 30s 3x Mini squat 2x10 to low table height (feel stretch in calf at bottom)  Step up  2x10 8"   8/7  Pt seen for aquatic therapy today.  Treatment took place in water 3.25-4 ft in depth at the [facility] pool. Temp of water was 91.  Pt entered/exited the pool via stairs  independently with bilat rail. (Step to)    Warm up: 3x fwd, lateral, and retro marching   Exercises: Heel cord stretch at steps 15x 4x  Step ups lateral and fwd at bottom step 2x10 (focus on slow lower, weight through middle of foot)  Toe walking 3x laps (xiphoid process deep) Tandem walking 3x widths SL balance 30s 3x SL HR in chest deep 2x20 Standing hip ABD 2x20 bilat intermittent UE Mini squat on bottom step 3x10 Golf swing rotation with R LE push off 20x Black TB Paloff press 2x10 Fwd lunge 2x10 without UE    Pt requires buoyancy for support and to offload joints with strengthening exercises. Viscosity of the water is needed for resistance of strengthening; water current perturbations provides challenge to standing balance unsupported, requiring increased core activation.  8/2  Pt seen for aquatic therapy today.  Treatment took place in water 3.25-4 ft in depth at the [facility] pool. Temp of water was 91.  Pt entered/exited the pool via stairs  independently with bilat rail. (Step to)    Warm up: 3x sidestepping, retro walking, fwd walking    Exercises: Marching 3x widths Standing gastroc stretch 30s 3x Tandem walking 3x widths SL balance 30s 3x HR/TR 2x20 without UE Standing hip ABD 2x10 bilat intermittent UE Mini squat without UE  2x20 Fwd lunge 2x10 with UE    Pt requires buoyancy for support and to offload joints with strengthening exercises. Viscosity of the water is needed for resistance of strengthening; water current perturbations provides challenge to standing balance unsupported, requiring increased core activation.  7/31  R TCJ post glide mob grade III-IV  Shuttle calf raise, double leg 31lbs 2x10 DL leg press shuttle 3x10 81 lbs  SL balance (attempted but unable) Sidestepping at bar YTB at ankle 3x laps Tandem balance 30s 3x 4 way ankle RTB 20x each Fig 4 Bridge 2x10 Standing DL calf raise with counter support 2x10   7/17  R TCJ post glide mob grade III-IV  Seated ankle DF stretch 10s 10x Standing weight shifting  without boot 5s 10x Heel toe rocking with boot on 2x10  Gait training: Single crutch, boot on; focus on improving R stance time, decrease L trunk lean, possible L shoe lift to reduce L lean and back pain    08/02/21:  Stretches R gastroc to tolerance 5x30 seconds, joint mobs grade III all directions to tolerance   SLRs R LE 1x15 Sidelying Hip ABD 1x15 B Prone hip ABD 1x15 B  Review of toe yoga, also towel crunches, arch raises   Gait training WBAT in boot with B crutches Min cues for technique/sequencing, then stair training 2 steps in gym B crutches min guard for safety/Mod cues for sequencing       PATIENT EDUCATION:  Education details:  aquatic properties, DOMS expectations, muscle firing,  envelope of function, HEP, POC  Person educated: Patient Education method: Explanation, Demonstration, Tactile cues, Verbal cues, and Handouts Education comprehension: verbalized understanding, returned demonstration, verbal cues required, and tactile cues required   HOME EXERCISE PROGRAM: Access Code: [email or web address] URL: [email or web address] Date: 07/24/2021 Prepared by: Daleen Bo  ASSESSMENT:  CLINICAL IMPRESSION:  Pt with good tolerance to progression of R  ankle stability as well as  strengthening at today's session. HEP updated at this time without issue. No pain noted during session. Plan to continue with R gastroc strength at this time and R ankle stability and motor control. ROM and gait are progressing but pt does continue to have noticeably short L step length due to reduce toe off on R. Pt advised to avoid any heavy loading, impact activity, and any activity at higher rates of power/speed.  Pt would benefit from continued skilled therapy in order to reach goals and maximize functional R LE strength and ROM for full return to PLOF.    OBJECTIVE IMPAIRMENTS Abnormal gait, decreased activity tolerance, decreased balance, decreased endurance, decreased knowledge of use of DME, decreased mobility, difficulty walking, decreased ROM, decreased strength, hypomobility, increased edema, increased fascial restrictions, impaired flexibility, impaired sensation, improper body mechanics, postural dysfunction, and pain.   ACTIVITY LIMITATIONS carrying, lifting, standing, squatting, stairs, transfers, bed mobility, bathing, toileting, dressing, locomotion level, and caring for others  PARTICIPATION LIMITATIONS: meal prep, cleaning, laundry, interpersonal relationship, driving, shopping, community activity, yard work, and exercise  PERSONAL FACTORS Age  affecting patient's functional outcome.   REHAB POTENTIAL: Good  CLINICAL DECISION MAKING: Stable/uncomplicated  EVALUATION COMPLEXITY: Low   GOALS:   SHORT TERM GOALS: Target date: 09/04/2021  Pt will become independent with initial HEP in order to demonstrate synthesis of PT education.   Goal status: INITIAL  2.  Pt will be able to demonstrate FWB and normalized gait in order to demonstrate functional improvement in LE function for self-care and house hold duties.   Goal status: INITIAL  3.  Pt will score at least 12 pt increase on FOTO to demonstrate functional improvement in MCII and pt perceived  function.     Goal status: INITIAL    LONG TERM GOALS: Target date: 10/16/2021   Pt  will become independent with final HEP in order to demonstrate synthesis of PT education.   Goal status: INITIAL  2.  Pt will score >/= 65 on FOTO to demonstrate improvement in perceived R LE function.   Goal status: INITIAL  3.  Pt will be able to demonstrate/report ability to walk >30 mins without pain in order to demonstrate functional improvement and tolerance to exercise and community mobility.   Goal status: INITIAL  4.  Pt will be able  to demonstrate ability to perform 20 consecutive SL calf raise in order to demonstrate functional improvement in LE function for functional improvement in SL strength and stability for return to PLOF.  Goal status: INITIAL   PLAN: PT FREQUENCY: 1-2x/week  PT DURATION: 12 weeks  PLANNED INTERVENTIONS: Therapeutic exercises, Therapeutic activity, Neuromuscular re-education, Balance training, Gait training, Patient/Family education, Joint manipulation, Joint mobilization, Stair training, Orthotic/Fit training, DME instructions, Aquatic Therapy, Dry Needling, Electrical stimulation, Spinal manipulation, Spinal mobilization, Cryotherapy, Moist heat, Compression bandaging, scar mobilization, Splintting, Taping, Vasopneumatic device, Traction, Ultrasound, Ionotophoresis '4mg'$ /ml Dexamethasone, Manual therapy, and Re-evaluation  PLAN FOR NEXT SESSION: progress strength as able and tolerated  Daleen Bo PT, DPT 08/30/21 10:12 AM

## 2021-09-04 ENCOUNTER — Ambulatory Visit (HOSPITAL_BASED_OUTPATIENT_CLINIC_OR_DEPARTMENT_OTHER): Payer: Medicare Other | Admitting: Physical Therapy

## 2021-09-04 ENCOUNTER — Encounter (HOSPITAL_BASED_OUTPATIENT_CLINIC_OR_DEPARTMENT_OTHER): Payer: Self-pay | Admitting: Physical Therapy

## 2021-09-04 DIAGNOSIS — M6281 Muscle weakness (generalized): Secondary | ICD-10-CM

## 2021-09-04 DIAGNOSIS — M25571 Pain in right ankle and joints of right foot: Secondary | ICD-10-CM | POA: Diagnosis not present

## 2021-09-04 DIAGNOSIS — R262 Difficulty in walking, not elsewhere classified: Secondary | ICD-10-CM

## 2021-09-04 DIAGNOSIS — M25671 Stiffness of right ankle, not elsewhere classified: Secondary | ICD-10-CM

## 2021-09-04 NOTE — Therapy (Signed)
OUTPATIENT PHYSICAL TREATMENT NOTE   Patient Name: Kendra Norman MRN: [id number] DOB:[birth year], 66 y.o., female Today's Date: 09/04/2021   PT End of Session - 09/04/21 0959     Visit Number 9    Number of Visits 25    Date for PT Re-Evaluation 10/22/21    Authorization Type MCR    Progress Note Due on Visit 10    PT Start Time 0933    PT Stop Time 1012    PT Time Calculation (min) 39 min    Equipment Utilized During Treatment Gait belt    Activity Tolerance Patient tolerated treatment well    Behavior During Therapy WFL for tasks assessed/performed                  Past Medical History:  Diagnosis Date   GERD (gastroesophageal reflux disease)    Seasonal allergies    Past Surgical History:  Procedure Laterality Date   ABDOMINAL HYSTERECTOMY     BREAST BIOPSY     BREAST EXCISIONAL BIOPSY Bilateral    fibroadenomas - benign   BREAST LUMPECTOMY Bilateral    fibroadenomas - benign   VEIN SURGERY     Patient Active Problem List   Diagnosis Date Noted   Strain of muscle, fascia and tendon of lower back, initial encounter 10/16/2018    PCP: Aletha Halim., PA-C  REFERRING PROVIDER: Criselda Peaches, DPM  REFERRING DIAG: [phone] (ICD-10-CM) - Rupture of right Achilles tendon, initial encounter  THERAPY DIAG:  Pain in right ankle and joints of right foot  Stiffness of right ankle, not elsewhere classified  Muscle weakness (generalized)  Difficulty walking  Rationale for Evaluation and Treatment Rehabilitation  ONSET DATE: 06/07/2021 REPAIR ACHILLES TENDON, REMOVAL OF BONE SPUR, CALF MUSCLE LENGTHENING, TENDON TRANSFER RIGHT  SUBJECTIVE:   SUBJECTIVE STATEMENT:  Pt states she had no issues after last session. She feels that the outside of the R foot feels like cramping with walking. No pain or soreness after last session.   Eval:  Pt states in back in the Fall, she had some what she thought was plantar fasciitis. In January, she had injections  and was put into the put. In April, she had PRP. In May she was putting on pants and felt that the "ankle went." Balance on the R leg. She had surgery 5/17. Pt plays golf. She also does water running. Husband at home helping.   Pt states she walks a lot. She even walks 30 min at a time with the knee scooter. Pain is well managed. No pain since day 3 post op. Pt denies signs of DVT.   PAIN:  Are you having pain? No: NPRS scale: 0/10 Pain location: R ankle incision Pain description: sharp, NT on top of    PRECAUTIONS: Other: Achilles  Per MD: 06/07/21 Surgery. Begin PT after 07/24/21, NWB exercise on strength and ROM until 7/17, then begin WB exercises; update 7/12 per Dr. Sherryle Lis Epic secure chat WBAT in boot but no WB therapy exercises until next week   WEIGHT BEARING RESTRICTIONS Yes NWB  FALLS:  Has patient fallen in last 6 months? No  LIVING ENVIRONMENT: Lives with: lives with their spouse Lives in: House/apartment Stairs: Yes, steps to enter and 2nd floor home gym  Has following equipment at home: Gilford Rile - 2 wheeled, Crutches, and knee scooter  OCCUPATION: retired   PLOF: Independent, has membership aquatic center   PATIENT GOALS : Pt states she would like to play golf and  return exercising/walking/ bike/ pickleball   OBJECTIVE:   DIAGNOSTIC FINDINGS:   IMPRESSION: 1. Complete insertional rupture of the Achilles tendon with moderate retraction. Underlying Achilles tendinosis with reactive edema in the calcaneal tuberosity. 2. Mild attenuation of the flexor digitorum longus tendon within the plantar aspect of the midfoot without evidence of focal tear. The additional ankle tendons appear normal. 3. 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 4. Mild tibiotalar and midfoot degenerative changes.  PATIENT SURVEYS:  FOTO 54 65 DC 12 pts MCII   LOWER EXTREMITY ROM:  Active ROM Right eval R  7/10 7/17 8/14 Left eval  Ankle  dorsiflexion -30 0 '3 10 6  '$ Ankle plantarflexion 50    55  Ankle inversion 12    30  Ankle eversion 8    20   (Blank rows = not tested)   TODAY'S TREATMENT  8/14   R TCJ post glide mob grade III-IV STM R fibularis group  Standing HR up 2 down1 3x10 4" box  fwd heel tap/step down 2x10 SL balance 30s 4x Gastroc stretch 30s 3x with foot prop  Mini squat 2x10 to low table height (feel stretch in calf at bottom)  Shuttle leg press SL 75lbs 3x10  SL calf raise 31lbs 3x10   8/9     R TCJ post glide mob grade III-IV IASTM R achilles scar    Shuttle calf raise, staggered stance 50lbs 2x10 2s hold DL shuttle press 87lbs 3x10 SL balance 30s 4x Gastroc stretch 30s 3x Mini squat 2x10 to low table height (feel stretch in calf at bottom)  Step up  2x10 8"   8/7  Pt seen for aquatic therapy today.  Treatment took place in water 3.25-4 ft in depth at the [facility] pool. Temp of water was 91.  Pt entered/exited the pool via stairs  independently with bilat rail. (Step to)    Warm up: 3x fwd, lateral, and retro marching   Exercises: Heel cord stretch at steps 15x 4x  Step ups lateral and fwd at bottom step 2x10 (focus on slow lower, weight through middle of foot)  Toe walking 3x laps (xiphoid process deep) Tandem walking 3x widths SL balance 30s 3x SL HR in chest deep 2x20 Standing hip ABD 2x20 bilat intermittent UE Mini squat on bottom step 3x10 Golf swing rotation with R LE push off 20x Black TB Paloff press 2x10 Fwd lunge 2x10 without UE    Pt requires buoyancy for support and to offload joints with strengthening exercises. Viscosity of the water is needed for resistance of strengthening; water current perturbations provides challenge to standing balance unsupported, requiring increased core activation.  8/2  Pt seen for aquatic therapy today.  Treatment took place in water 3.25-4 ft in depth at the [facility] pool. Temp of water was 91.  Pt  entered/exited the pool via stairs  independently with bilat rail. (Step to)    Warm up: 3x sidestepping, retro walking, fwd walking    Exercises: Marching 3x widths Standing gastroc stretch 30s 3x Tandem walking 3x widths SL balance 30s 3x HR/TR 2x20 without UE Standing hip ABD 2x10 bilat intermittent UE Mini squat without UE 2x20 Fwd lunge 2x10 with UE    Pt requires buoyancy for support and to offload joints with strengthening exercises. Viscosity of the water is needed for resistance of strengthening; water current perturbations provides challenge to standing balance unsupported, requiring increased core activation.  7/31  R TCJ post glide  mob grade III-IV  Shuttle calf raise, double leg 31lbs 2x10 DL leg press shuttle 3x10 81 lbs  SL balance (attempted but unable) Sidestepping at bar YTB at ankle 3x laps Tandem balance 30s 3x 4 way ankle RTB 20x each Fig 4 Bridge 2x10 Standing DL calf raise with counter support 2x10   7/17  R TCJ post glide mob grade III-IV  Seated ankle DF stretch 10s 10x Standing weight shifting without boot 5s 10x Heel toe rocking with boot on 2x10  Gait training: Single crutch, boot on; focus on improving R stance time, decrease L trunk lean, possible L shoe lift to reduce L lean and back pain    08/02/21:  Stretches R gastroc to tolerance 5x30 seconds, joint mobs grade III all directions to tolerance   SLRs R LE 1x15 Sidelying Hip ABD 1x15 B Prone hip ABD 1x15 B  Review of toe yoga, also towel crunches, arch raises   Gait training WBAT in boot with B crutches Min cues for technique/sequencing, then stair training 2 steps in gym B crutches min guard for safety/Mod cues for sequencing       PATIENT EDUCATION:  Education details:  aquatic properties, DOMS expectations, muscle firing,  envelope of function, HEP, POC  Person educated: Patient Education method: Explanation, Demonstration, Tactile cues, Verbal cues, and  Handouts Education comprehension: verbalized understanding, returned demonstration, verbal cues required, and tactile cues required   HOME EXERCISE PROGRAM: Access Code: [email or web address] URL: [email or web address] Date: 07/24/2021 Prepared by: Daleen Bo  ASSESSMENT:  CLINICAL IMPRESSION: Pt with very good improvement in ankle DF ROM with functional ankle DF in CKC. Pt also able to progress strengthening today at near 13 wks. Pt able to perform SL motions as well as BW loaded eccentric today without pain. Pt does have expected eccentric lowering strength deficits but able to control most of the ROM. Plan to review stability based exercise at next prior to pt leaving for extended travel. Pt still advised to avoid any heavy loading, impact activity, and any activity at higher rates of power/speed.  Pt would benefit from continued skilled therapy in order to reach goals and maximize functional R LE strength and ROM for full return to PLOF.    OBJECTIVE IMPAIRMENTS Abnormal gait, decreased activity tolerance, decreased balance, decreased endurance, decreased knowledge of use of DME, decreased mobility, difficulty walking, decreased ROM, decreased strength, hypomobility, increased edema, increased fascial restrictions, impaired flexibility, impaired sensation, improper body mechanics, postural dysfunction, and pain.   ACTIVITY LIMITATIONS carrying, lifting, standing, squatting, stairs, transfers, bed mobility, bathing, toileting, dressing, locomotion level, and caring for others  PARTICIPATION LIMITATIONS: meal prep, cleaning, laundry, interpersonal relationship, driving, shopping, community activity, yard work, and exercise  PERSONAL FACTORS Age  affecting patient's functional outcome.   REHAB POTENTIAL: Good  CLINICAL DECISION MAKING: Stable/uncomplicated  EVALUATION COMPLEXITY: Low   GOALS:   SHORT TERM GOALS: Target date: 09/04/2021  Pt will become independent with initial HEP  in order to demonstrate synthesis of PT education.   Goal status: INITIAL  2.  Pt will be able to demonstrate FWB and normalized gait in order to demonstrate functional improvement in LE function for self-care and house hold duties.   Goal status: INITIAL  3.  Pt will score at least 12 pt increase on FOTO to demonstrate functional improvement in MCII and pt perceived function.     Goal status: INITIAL    LONG TERM GOALS: Target date: 10/16/2021   Pt  will become independent with final HEP  in order to demonstrate synthesis of PT education.   Goal status: INITIAL  2.  Pt will score >/= 65 on FOTO to demonstrate improvement in perceived R LE function.   Goal status: INITIAL  3.  Pt will be able to demonstrate/report ability to walk >30 mins without pain in order to demonstrate functional improvement and tolerance to exercise and community mobility.   Goal status: INITIAL  4.  Pt will be able to demonstrate ability to perform 20 consecutive SL calf raise in order to demonstrate functional improvement in LE function for functional improvement in SL strength and stability for return to PLOF.  Goal status: INITIAL   PLAN: PT FREQUENCY: 1-2x/week  PT DURATION: 12 weeks  PLANNED INTERVENTIONS: Therapeutic exercises, Therapeutic activity, Neuromuscular re-education, Balance training, Gait training, Patient/Family education, Joint manipulation, Joint mobilization, Stair training, Orthotic/Fit training, DME instructions, Aquatic Therapy, Dry Needling, Electrical stimulation, Spinal manipulation, Spinal mobilization, Cryotherapy, Moist heat, Compression bandaging, scar mobilization, Splintting, Taping, Vasopneumatic device, Traction, Ultrasound, Ionotophoresis '4mg'$ /ml Dexamethasone, Manual therapy, and Re-evaluation  PLAN FOR NEXT SESSION: progress strength as able and tolerated  Daleen Bo PT, DPT 09/04/21 10:12 AM

## 2021-09-06 ENCOUNTER — Encounter: Payer: Self-pay | Admitting: Podiatry

## 2021-09-06 ENCOUNTER — Encounter (HOSPITAL_BASED_OUTPATIENT_CLINIC_OR_DEPARTMENT_OTHER): Payer: Self-pay | Admitting: Physical Therapy

## 2021-09-06 ENCOUNTER — Ambulatory Visit (HOSPITAL_BASED_OUTPATIENT_CLINIC_OR_DEPARTMENT_OTHER): Payer: Medicare Other | Admitting: Physical Therapy

## 2021-09-06 DIAGNOSIS — M25671 Stiffness of right ankle, not elsewhere classified: Secondary | ICD-10-CM

## 2021-09-06 DIAGNOSIS — R262 Difficulty in walking, not elsewhere classified: Secondary | ICD-10-CM

## 2021-09-06 DIAGNOSIS — M25571 Pain in right ankle and joints of right foot: Secondary | ICD-10-CM

## 2021-09-06 DIAGNOSIS — M6281 Muscle weakness (generalized): Secondary | ICD-10-CM

## 2021-09-06 NOTE — Therapy (Signed)
OUTPATIENT PHYSICAL TREATMENT NOTE   Patient Name: Kendra Norman MRN: [id number] DOB:1955-01-24, 66 y.o., female Today's Date: 09/06/2021   PT End of Session - 09/06/21 1640     Visit Number 10    Number of Visits 25    Date for PT Re-Evaluation 10/22/21    Authorization Type MCR    Progress Note Due on Visit 10    PT Start Time 1640    PT Stop Time 1720    PT Time Calculation (min) 40 min    Equipment Utilized During Treatment Gait belt    Activity Tolerance Patient tolerated treatment well    Behavior During Therapy WFL for tasks assessed/performed                  Past Medical History:  Diagnosis Date   GERD (gastroesophageal reflux disease)    Seasonal allergies    Past Surgical History:  Procedure Laterality Date   ABDOMINAL HYSTERECTOMY     BREAST BIOPSY     BREAST EXCISIONAL BIOPSY Bilateral    fibroadenomas - benign   BREAST LUMPECTOMY Bilateral    fibroadenomas - benign   VEIN SURGERY     Patient Active Problem List   Diagnosis Date Noted   Strain of muscle, fascia and tendon of lower back, initial encounter 10/16/2018    PCP: Aletha Halim., PA-C  REFERRING PROVIDER: Criselda Peaches, DPM  REFERRING DIAG: [phone] (ICD-10-CM) - Rupture of right Achilles tendon, initial encounter  THERAPY DIAG:  Stiffness of right ankle, not elsewhere classified  Muscle weakness (generalized)  Difficulty walking  Pain in right ankle and joints of right foot  Rationale for Evaluation and Treatment Rehabilitation  ONSET DATE: 06/07/2021 REPAIR ACHILLES TENDON, REMOVAL OF BONE SPUR, CALF MUSCLE LENGTHENING, TENDON TRANSFER RIGHT  SUBJECTIVE:   SUBJECTIVE STATEMENT:  Pt states she had no issues after last session. She feels that the outside of the R foot feels tight and hurts to walk today.   Eval:  Pt states in back in the Fall, she had some what she thought was plantar fasciitis. In January, she had injections and was put into the put. In April,  she had PRP. In May she was putting on pants and felt that the "ankle went." Balance on the R leg. She had surgery 5/17. Pt plays golf. She also does water running. Husband at home helping.   Pt states she walks a lot. She even walks 30 min at a time with the knee scooter. Pain is well managed. No pain since day 3 post op. Pt denies signs of DVT.   PAIN:  Are you having pain? No: NPRS scale: 0/10 Pain location: R ankle incision Pain description: sharp, NT on top of    PRECAUTIONS: Other: Achilles  Per MD: 06/07/21 Surgery. Begin PT after 07/24/21, NWB exercise on strength and ROM until 7/17, then begin WB exercises; update 7/12 per Dr. Sherryle Lis Epic secure chat WBAT in boot but no WB therapy exercises until next week   WEIGHT BEARING RESTRICTIONS Yes NWB  FALLS:  Has patient fallen in last 6 months? No  LIVING ENVIRONMENT: Lives with: lives with their spouse Lives in: House/apartment Stairs: Yes, steps to enter and 2nd floor home gym  Has following equipment at home: Gilford Rile - 2 wheeled, Crutches, and knee scooter  OCCUPATION: retired   PLOF: Independent, has membership aquatic center   PATIENT GOALS : Pt states she would like to play golf and return exercising/walking/ bike/ pickleball  OBJECTIVE:   DIAGNOSTIC FINDINGS:   IMPRESSION: 1. Complete insertional rupture of the Achilles tendon with moderate retraction. Underlying Achilles tendinosis with reactive edema in the calcaneal tuberosity. 2. Mild attenuation of the flexor digitorum longus tendon within the plantar aspect of the midfoot without evidence of focal tear. The additional ankle tendons appear normal. 3. 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 4. Mild tibiotalar and midfoot degenerative changes.  PATIENT SURVEYS:  FOTO 45 41 DC 12 pts MCII      FOTO 10th visit 8/16 59 pts   LOWER EXTREMITY ROM:  Active ROM Right eval R  7/10 7/17 8/14 8/16 Left eval   Ankle dorsiflexion -30 0 3 10 10  with OP 6  Ankle plantarflexion 50    50 55  Ankle inversion 12    27 30   Ankle eversion 8    9 20    (Blank rows = not tested)  MMT Right 8/16  Ankle dorsiflexion 4+/5  Ankle plantarflexion 2+/5  Ankle inversion 4+/5  Ankle eversion 4/5   GAIT: 48f decrease in R stance time, early heel off, decreased L step length  TODAY'S TREATMENT  8/16  HR off step 2s hold 2x10 SL balance on airex 30s 3x Shuttle leg press SL 75lbs 3x10  SL shuttle calf raise 31lbs 3x10 4" box  fwd heel tap/step down 10x each   8/14   R TCJ post glide mob grade III-IV STM R fibularis group  Standing HR up 2 down1 3x10 4" box  fwd heel tap/step down 2x10 SL balance 30s 4x Gastroc stretch 30s 3x with foot prop  Mini squat 2x10 to low table height (feel stretch in calf at bottom)  Shuttle leg press SL 75lbs 3x10  SL calf raise 31lbs 3x10   8/9     R TCJ post glide mob grade III-IV IASTM R achilles scar    Shuttle calf raise, staggered stance 50lbs 2x10 2s hold DL shuttle press 87lbs 3x10 SL balance 30s 4x Gastroc stretch 30s 3x Mini squat 2x10 to low table height (feel stretch in calf at bottom)  Step up  2x10 8"   8/7  Pt seen for aquatic therapy today.  Treatment took place in water 3.25-4 ft [facility] Temp of water was 91.  Pt entered/exited the pool via stairs  independently with bilat rail. (Step to)    Warm up: 3x fwd, lateral, and retro marching   Exercises: Heel cord stretch at steps 15x 4x  Step ups lateral and fwd at bottom step 2x10 (focus on slow lower, weight through middle of foot)  Toe walking 3x laps (xiphoid process deep) Tandem walking 3x widths SL balance 30s 3x SL HR in chest deep 2x20 Standing hip ABD 2x20 bilat intermittent UE Mini squat on bottom step 3x10 Golf swing rotation with R LE push off 20x Black TB Paloff press 2x10 Fwd lunge 2x10 without UE    Pt requires buoyancy for support  and to offload joints with strengthening exercises. Viscosity of the water is needed for resistance of strengthening; water current perturbations provides challenge to standing balance unsupported, requiring increased core activation.  8/2  Pt seen for aquatic therapy today.  Treatment took place in water 3.25[facility]4 [facility] Temp of water was 91.  Pt entered/exited the pool via stairs  independently with bilat rail. (Step to)    Warm up: 3x sidestepping, retro walking, fwd walking  Exercises: Marching 3x widths Standing gastroc stretch 30s 3x Tandem walking 3x widths SL balance 30s 3x HR/TR 2x20 without UE Standing hip ABD 2x10 bilat intermittent UE Mini squat without UE 2x20 Fwd lunge 2x10 with UE    Pt requires buoyancy for support and to offload joints with strengthening exercises. Viscosity of the water is needed for resistance of strengthening; water current perturbations provides challenge to standing balance unsupported, requiring increased core activation.  7/31  R TCJ post glide mob grade III-IV  Shuttle calf raise, double leg 31lbs 2x10 DL leg press shuttle 3x10 81 lbs  SL balance (attempted but unable) Sidestepping at bar YTB at ankle 3x laps Tandem balance 30s 3x 4 way ankle RTB 20x each Fig 4 Bridge 2x10 Standing DL calf raise with counter support 2x10   7/17  R TCJ post glide mob grade III-IV  Seated ankle DF stretch 10s 10x Standing weight shifting without boot 5s 10x Heel toe rocking with boot on 2x10  Gait training: Single crutch, boot on; focus on improving R stance time, decrease L trunk lean, possible L shoe lift to reduce L lean and back pain    08/02/21:  Stretches R gastroc to tolerance 5x30 seconds, joint mobs grade III all directions to tolerance   SLRs R LE 1x15 Sidelying Hip ABD 1x15 B Prone hip ABD 1x15 B  Review of toe yoga, also towel crunches, arch raises   Gait training WBAT in boot with B  crutches Min cues for technique/sequencing, then stair training 2 steps in gym B crutches min guard for safety/Mod cues for sequencing       PATIENT EDUCATION:  Education details: exercise progress, safety/surgical protection, muscle firing,  envelope of function, HEP, POC  Person educated: Patient Education method: Explanation, Demonstration, Tactile cues, Verbal cues, and Handouts Education comprehension: verbalized understanding, returned demonstration, verbal cues required, and tactile cues required   HOME EXERCISE PROGRAM: Access Code: [email or web address] URL: [email or web address] Date: 07/24/2021 Prepared by: Daleen Bo  ASSESSMENT:  CLINICAL IMPRESSION: Pt making significant improvements in objective and subjective measures. Pt with significant improvements in functional mobility and ROM. However, PF strength on the R is still limited as expected. Plan to progress ankle stability and PF strength as tolerated at future visits. Pt with well controlled pain and sensitivity in lateral foot as expected as she increases number of foot contacts near daily. Pt to travel for the next few weeks and will return 9/5.   Pt would benefit from continued skilled therapy in order to reach goals and maximize functional R LE strength and ROM for full return to PLOF.    OBJECTIVE IMPAIRMENTS Abnormal gait, decreased activity tolerance, decreased balance, decreased endurance, decreased knowledge of use of DME, decreased mobility, difficulty walking, decreased ROM, decreased strength, hypomobility, increased edema, increased fascial restrictions, impaired flexibility, impaired sensation, improper body mechanics, postural dysfunction, and pain.   ACTIVITY LIMITATIONS carrying, lifting, standing, squatting, stairs, transfers, bed mobility, bathing, toileting, dressing, locomotion level, and caring for others  PARTICIPATION LIMITATIONS: meal prep, cleaning, laundry, interpersonal relationship,  driving, shopping, community activity, yard work, and exercise  PERSONAL FACTORS Age  affecting patient's functional outcome.   REHAB POTENTIAL: Good  CLINICAL DECISION MAKING: Stable/uncomplicated  EVALUATION COMPLEXITY: Low   GOALS:   SHORT TERM GOALS: Target date: 09/04/2021  Pt will become independent with initial HEP in order to demonstrate synthesis of PT education.   Goal status: MET  2.  Pt will be able to demonstrate FWB and normalized gait in  order to demonstrate functional improvement in LE function for self-care and house hold duties.   Goal status: MET  3.  Pt will score at least 12 pt increase on FOTO to demonstrate functional improvement in MCII and pt perceived function.     Goal status: MET    LONG TERM GOALS: Target date: 10/16/2021   Pt  will become independent with final HEP in order to demonstrate synthesis of PT education.   Goal status: ongoing  2.  Pt will score >/= 65 on FOTO to demonstrate improvement in perceived R LE function.   Goal status: ongoing  3.  Pt will be able to demonstrate/report ability to walk >30 mins without pain in order to demonstrate functional improvement and tolerance to exercise and community mobility.   Goal status: partially met  4.  Pt will be able to demonstrate ability to perform 20 consecutive SL calf raise in order to demonstrate functional improvement in LE function for functional improvement in SL strength and stability for return to PLOF.  Goal status: ongoing   PLAN: PT FREQUENCY: 1-2x/week  PT DURATION: 12 weeks  PLANNED INTERVENTIONS: Therapeutic exercises, Therapeutic activity, Neuromuscular re-education, Balance training, Gait training, Patient/Family education, Joint manipulation, Joint mobilization, Stair training, Orthotic/Fit training, DME instructions, Aquatic Therapy, Dry Needling, Electrical stimulation, Spinal manipulation, Spinal mobilization, Cryotherapy, Moist heat, Compression bandaging,  scar mobilization, Splintting, Taping, Vasopneumatic device, Traction, Ultrasound, Ionotophoresis 6m/ml Dexamethasone, Manual therapy, and Re-evaluation  PLAN FOR NEXT SESSION: progress strength as able and tolerated  ADaleen BoPT, DPT 09/06/21 5:26 PM

## 2021-09-26 ENCOUNTER — Ambulatory Visit (HOSPITAL_BASED_OUTPATIENT_CLINIC_OR_DEPARTMENT_OTHER): Payer: Medicare Other | Admitting: Physical Therapy

## 2021-09-28 ENCOUNTER — Encounter (HOSPITAL_BASED_OUTPATIENT_CLINIC_OR_DEPARTMENT_OTHER): Payer: Medicare Other | Admitting: Physical Therapy

## 2021-10-02 ENCOUNTER — Ambulatory Visit (HOSPITAL_BASED_OUTPATIENT_CLINIC_OR_DEPARTMENT_OTHER): Payer: Medicare Other | Attending: Podiatry | Admitting: Physical Therapy

## 2021-10-02 ENCOUNTER — Encounter (HOSPITAL_BASED_OUTPATIENT_CLINIC_OR_DEPARTMENT_OTHER): Payer: Self-pay | Admitting: Physical Therapy

## 2021-10-02 DIAGNOSIS — R262 Difficulty in walking, not elsewhere classified: Secondary | ICD-10-CM | POA: Diagnosis present

## 2021-10-02 DIAGNOSIS — M25671 Stiffness of right ankle, not elsewhere classified: Secondary | ICD-10-CM

## 2021-10-02 DIAGNOSIS — M25571 Pain in right ankle and joints of right foot: Secondary | ICD-10-CM

## 2021-10-02 DIAGNOSIS — M6281 Muscle weakness (generalized): Secondary | ICD-10-CM | POA: Diagnosis present

## 2021-10-02 NOTE — Therapy (Signed)
OUTPATIENT PHYSICAL TREATMENT NOTE   Patient Name: Kendra Norman MRN: [id number] DOB:01-05-1956, 66 y.o., female Today's Date: 10/02/2021   PT End of Session - 10/02/21 1021     Visit Number 11    Number of Visits 25    Date for PT Re-Evaluation 10/22/21    Authorization Type MCR    Progress Note Due on Visit 10    PT Start Time 0932    PT Stop Time 1011    PT Time Calculation (min) 39 min    Equipment Utilized During Treatment Gait belt    Activity Tolerance Patient tolerated treatment well    Behavior During Therapy WFL for tasks assessed/performed                   Past Medical History:  Diagnosis Date   GERD (gastroesophageal reflux disease)    Seasonal allergies    Past Surgical History:  Procedure Laterality Date   ABDOMINAL HYSTERECTOMY     BREAST BIOPSY     BREAST EXCISIONAL BIOPSY Bilateral    fibroadenomas - benign   BREAST LUMPECTOMY Bilateral    fibroadenomas - benign   VEIN SURGERY     Patient Active Problem List   Diagnosis Date Noted   Strain of muscle, fascia and tendon of lower back, initial encounter 10/16/2018    PCP: Aletha Halim., PA-C  REFERRING PROVIDER: Criselda Peaches, DPM  REFERRING DIAG: [phone] (ICD-10-CM) - Rupture of right Achilles tendon, initial encounter  THERAPY DIAG:  Stiffness of right ankle, not elsewhere classified  Muscle weakness (generalized)  Difficulty walking  Pain in right ankle and joints of right foot  Rationale for Evaluation and Treatment Rehabilitation  ONSET DATE: 06/07/2021 REPAIR ACHILLES TENDON, REMOVAL OF BONE SPUR, CALF MUSCLE LENGTHENING, TENDON TRANSFER RIGHT  SUBJECTIVE:   SUBJECTIVE STATEMENT:  Pt states she had no issues after last session. She feels that the outside of the R foot feels tight and hurts to walk today.   Eval:  Pt states in back in the Fall, she had some what she thought was plantar fasciitis. In January, she had injections and was put into the put. In  April, she had PRP. In May she was putting on pants and felt that the "ankle went." Balance on the R leg. She had surgery 5/17. Pt plays golf. She also does water running. Husband at home helping.   Pt states she walks a lot. She even walks 30 min at a time with the knee scooter. Pain is well managed. No pain since day 3 post op. Pt denies signs of DVT.   PAIN:  Are you having pain? No: NPRS scale: 0/10 Pain location: R ankle incision Pain description: sharp, NT on top of    PRECAUTIONS: Other: Achilles  Per MD: 06/07/21 Surgery. Begin PT after 07/24/21, NWB exercise on strength and ROM until 7/17, then begin WB exercises; update 7/12 per Dr. Sherryle Lis Epic secure chat WBAT in boot but no WB therapy exercises until next week   WEIGHT BEARING RESTRICTIONS Yes NWB  FALLS:  Has patient fallen in last 6 months? No  LIVING ENVIRONMENT: Lives with: lives with their spouse Lives in: House/apartment Stairs: Yes, steps to enter and 2nd floor home gym  Has following equipment at home: Gilford Rile - 2 wheeled, Crutches, and knee scooter  OCCUPATION: retired   PLOF: Independent, has membership aquatic center   PATIENT GOALS : Pt states she would like to play golf and return exercising/walking/ bike/ pickleball  OBJECTIVE:   DIAGNOSTIC FINDINGS:   IMPRESSION: 1. Complete insertional rupture of the Achilles tendon with moderate retraction. Underlying Achilles tendinosis with reactive edema in the calcaneal tuberosity. 2. Mild attenuation of the flexor digitorum longus tendon within the plantar aspect of the midfoot without evidence of focal tear. The additional ankle tendons appear normal. 3. 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 4. Mild tibiotalar and midfoot degenerative changes.  PATIENT SURVEYS:  FOTO 45 76 DC 12 pts MCII      FOTO 10th visit 8/16 59 pts   LOWER EXTREMITY ROM:  Active ROM Right eval R  7/10 7/17 8/14 8/16  Left eval  Ankle dorsiflexion -30 0 3 10 10  with OP 6  Ankle plantarflexion 50    50 55  Ankle inversion 12    27 30   Ankle eversion 8    9 20    (Blank rows = not tested)  MMT Right 8/16  Ankle dorsiflexion 4+/5  Ankle plantarflexion 2+/5  Ankle inversion 4+/5  Ankle eversion 4/5   GAIT: 42f decrease in R stance time, early heel off, decreased L step length  TODAY'S TREATMENT  9/11  R TCJ post glide mob grade III-IV  HR off step 2s hold 2x10 SL balance on airex 20s 3x SL shuttle calf raise 75lbs 3x10 6" box  fwd heel tap/step down 10x each White leg press 65lbs 3x10 SL calf raise with toe touch 2x10 (discussed for home)     8/14   R TCJ post glide mob grade III-IV STM R fibularis group  Standing HR up 2 down1 3x10 4" box  fwd heel tap/step down 2x10 SL balance 30s 4x Gastroc stretch 30s 3x with foot prop  Mini squat 2x10 to low table height (feel stretch in calf at bottom)  Shuttle leg press SL 75lbs 3x10  SL calf raise 31lbs 3x10   8/9     R TCJ post glide mob grade III-IV IASTM R achilles scar    Shuttle calf raise, staggered stance 50lbs 2x10 2s hold DL shuttle press 87lbs 3x10 SL balance 30s 4x Gastroc stretch 30s 3x Mini squat 2x10 to low table height (feel stretch in calf at bottom)  Step up  2x10 8"   8/7  Pt seen for aquatic therapy today.  Treatment took place in [facility].25[facility] Temp of [facility] was 91.  Pt entered/exited the pool via stairs  independently with bilat rail. (Step to)    Warm up: 3x fwd, lateral, and retro marching   Exercises: Heel cord stretch at steps 15x 4x  Step ups lateral and fwd at bottom step 2x10 (focus on slow lower, weight through middle of foot)  Toe walking 3x laps (xiphoid process deep) Tandem walking 3x widths SL balance 30s 3x SL HR in chest deep 2x20 Standing hip ABD 2x20 bilat intermittent UE Mini squat on bottom step 3x10 Golf swing rotation with R LE push  off 20x Black TB Paloff press 2x10 Fwd lunge 2x10 without UE    Pt requires buoyancy for support and to offload joints with strengthening exercises. Viscosity of the water is needed for resistance of strengthening; water current perturbations provides challenge to standing balance unsupported, requiring increased core activation.  8/2  Pt seen for aquatic therapy today.  Treatment took place in [facility].25[facility] Temp of [facility] was 91.  Pt entered/exited the pool via stairs  independently with bilat rail. (Step to)    Warm up: 3x sidestepping, retro walking, fwd walking    Exercises: Marching 3x widths Standing gastroc stretch 30s 3x Tandem walking 3x widths SL balance 30s 3x HR/TR 2x20 without UE Standing hip ABD 2x10 bilat intermittent UE Mini squat without UE 2x20 Fwd lunge 2x10 with UE    Pt requires buoyancy for support and to offload joints with strengthening exercises. Viscosity of the water is needed for resistance of strengthening; water current perturbations provides challenge to standing balance unsupported, requiring increased core activation.  7/31  R TCJ post glide mob grade III-IV  Shuttle calf raise, double leg 31lbs 2x10 DL leg press shuttle 3x10 81 lbs  SL balance (attempted but unable) Sidestepping at bar YTB at ankle 3x laps Tandem balance 30s 3x 4 way ankle RTB 20x each Fig 4 Bridge 2x10 Standing DL calf raise with counter support 2x10   7/17  R TCJ post glide mob grade III-IV  Seated ankle DF stretch 10s 10x Standing weight shifting without boot 5s 10x Heel toe rocking with boot on 2x10  Gait training: Single crutch, boot on; focus on improving R stance time, decrease L trunk lean, possible L shoe lift to reduce L lean and back pain    08/02/21:  Stretches R gastroc to tolerance 5x30 seconds, joint mobs grade III all directions to tolerance   SLRs R LE 1x15 Sidelying Hip ABD 1x15 B Prone hip ABD 1x15  B  Review of toe yoga, also towel crunches, arch raises   Gait training WBAT in boot with B crutches Min cues for technique/sequencing, then stair training 2 steps in gym B crutches min guard for safety/Mod cues for sequencing       PATIENT EDUCATION:  Education details: exercise progress, safety/surgical protection, muscle firing,  envelope of function, HEP, POC  Person educated: Patient Education method: Explanation, Demonstration, Tactile cues, Verbal cues, and Handouts Education comprehension: verbalized understanding, returned demonstration, verbal cues required, and tactile cues required   HOME EXERCISE PROGRAM: Access Code: [email or web address] URL: [email or web address] Date: 07/24/2021 Prepared by: Daleen Bo  ASSESSMENT:  CLINICAL IMPRESSION: Pt has returned from trip and recent [place] recovery. Pt able to continue with R LE strength as well as progression of stability. Pt presents with increase R ankle DF joint stiffnes as expected given recent walking. Pt able to progress stability and strength based exercise for HEP today as well. Pt progressing as expected and doing well. Nearly able to perform SL calf raise at this time. Pt would benefit from continued skilled therapy in order to reach goals and maximize functional R LE strength and ROM for full return to PLOF.    OBJECTIVE IMPAIRMENTS Abnormal gait, decreased activity tolerance, decreased balance, decreased endurance, decreased knowledge of use of DME, decreased mobility, difficulty walking, decreased ROM, decreased strength, hypomobility, increased edema, increased fascial restrictions, impaired flexibility, impaired sensation, improper body mechanics, postural dysfunction, and pain.   ACTIVITY LIMITATIONS carrying, lifting, standing, squatting, stairs, transfers, bed mobility, bathing, toileting, dressing, locomotion level, and caring for others  PARTICIPATION LIMITATIONS: meal prep, cleaning, laundry,  interpersonal relationship, driving, shopping, community activity, yard work, and exercise  PERSONAL FACTORS Age  affecting patient's functional outcome.   REHAB POTENTIAL: Good  CLINICAL DECISION MAKING: Stable/uncomplicated  EVALUATION COMPLEXITY: Low   GOALS:   SHORT TERM GOALS: Target date: 09/04/2021  Pt will become independent with initial HEP in order to demonstrate synthesis of PT education.   Goal status: MET  2.  Pt will be able to demonstrate FWB and normalized gait in order to demonstrate functional improvement in LE function for self-care and house hold duties.   Goal status: MET  3.  Pt will score at least 12 pt increase on FOTO to demonstrate functional improvement in MCII and pt perceived function.     Goal status: MET    LONG TERM GOALS: Target date: 10/16/2021   Pt  will become independent with final HEP in order to demonstrate synthesis of PT education.   Goal status: ongoing  2.  Pt will score >/= 65 on FOTO to demonstrate improvement in perceived R LE function.   Goal status: ongoing  3.  Pt will be able to demonstrate/report ability to walk >30 mins without pain in order to demonstrate functional improvement and tolerance to exercise and community mobility.   Goal status: partially met  4.  Pt will be able to demonstrate ability to perform 20 consecutive SL calf raise in order to demonstrate functional improvement in LE function for functional improvement in SL strength and stability for return to PLOF.  Goal status: ongoing   PLAN: PT FREQUENCY: 1-2x/week  PT DURATION: 12 weeks  PLANNED INTERVENTIONS: Therapeutic exercises, Therapeutic activity, Neuromuscular re-education, Balance training, Gait training, Patient/Family education, Joint manipulation, Joint mobilization, Stair training, Orthotic/Fit training, DME instructions, Aquatic Therapy, Dry Needling, Electrical stimulation, Spinal manipulation, Spinal mobilization, Cryotherapy, Moist  heat, Compression bandaging, scar mobilization, Splintting, Taping, Vasopneumatic device, Traction, Ultrasound, Ionotophoresis 104m/ml Dexamethasone, Manual therapy, and Re-evaluation  PLAN FOR NEXT SESSION: progress strength as able and tolerated  ADaleen BoPT, DPT 10/02/21 10:22 AM

## 2021-10-05 ENCOUNTER — Encounter (HOSPITAL_BASED_OUTPATIENT_CLINIC_OR_DEPARTMENT_OTHER): Payer: Self-pay | Admitting: Physical Therapy

## 2021-10-05 ENCOUNTER — Ambulatory Visit (HOSPITAL_BASED_OUTPATIENT_CLINIC_OR_DEPARTMENT_OTHER): Payer: Medicare Other | Admitting: Physical Therapy

## 2021-10-05 DIAGNOSIS — M6281 Muscle weakness (generalized): Secondary | ICD-10-CM

## 2021-10-05 DIAGNOSIS — R262 Difficulty in walking, not elsewhere classified: Secondary | ICD-10-CM

## 2021-10-05 DIAGNOSIS — M25671 Stiffness of right ankle, not elsewhere classified: Secondary | ICD-10-CM

## 2021-10-05 DIAGNOSIS — M25571 Pain in right ankle and joints of right foot: Secondary | ICD-10-CM

## 2021-10-05 NOTE — Therapy (Signed)
OUTPATIENT PHYSICAL TREATMENT NOTE   Patient Name: Kendra Norman MRN: [id number] DOB:[birth year], 66 y.o., female Today's Date: 10/05/2021   PT End of Session - 10/05/21 1010     Visit Number 12    Number of Visits 25    Date for PT Re-Evaluation 10/22/21    Authorization Type MCR    Progress Note Due on Visit 10    PT Start Time 0930    PT Stop Time 1010    PT Time Calculation (min) 40 min    Equipment Utilized During Treatment Gait belt    Activity Tolerance Patient tolerated treatment well    Behavior During Therapy WFL for tasks assessed/performed                    Past Medical History:  Diagnosis Date   GERD (gastroesophageal reflux disease)    Seasonal allergies    Past Surgical History:  Procedure Laterality Date   ABDOMINAL HYSTERECTOMY     BREAST BIOPSY     BREAST EXCISIONAL BIOPSY Bilateral    fibroadenomas - benign   BREAST LUMPECTOMY Bilateral    fibroadenomas - benign   VEIN SURGERY     Patient Active Problem List   Diagnosis Date Noted   Strain of muscle, fascia and tendon of lower back, initial encounter 10/16/2018    PCP: Aletha Halim., PA-C  REFERRING PROVIDER: Criselda Peaches, DPM  REFERRING DIAG: [phone] (ICD-10-CM) - Rupture of right Achilles tendon, initial encounter  THERAPY DIAG:  Stiffness of right ankle, not elsewhere classified  Muscle weakness (generalized)  Difficulty walking  Pain in right ankle and joints of right foot  Rationale for Evaluation and Treatment Rehabilitation  ONSET DATE: 06/07/2021 REPAIR ACHILLES TENDON, REMOVAL OF BONE SPUR, CALF MUSCLE LENGTHENING, TENDON TRANSFER RIGHT  SUBJECTIVE:   SUBJECTIVE STATEMENT:  Pt states she had no issues after last session. She feels that the outside of the R foot feels tight and hurts to walk today.   Eval:  Pt states in back in the Fall, she had some what she thought was plantar fasciitis. In January, she had injections and was put into the put. In  April, she had PRP. In May she was putting on pants and felt that the "ankle went." Balance on the R leg. She had surgery 5/17. Pt plays golf. She also does water running. Husband at home helping.   Pt states she walks a lot. She even walks 30 min at a time with the knee scooter. Pain is well managed. No pain since day 3 post op. Pt denies signs of DVT.   PAIN:  Are you having pain? No: NPRS scale: 0/10 Pain location: R ankle incision Pain description: sharp, NT on top of    PRECAUTIONS: Other: Achilles  Per MD: 06/07/21 Surgery. Begin PT after 07/24/21, NWB exercise on strength and ROM until 7/17, then begin WB exercises; update 7/12 per Dr. Sherryle Lis Epic secure chat WBAT in boot but no WB therapy exercises until next week   WEIGHT BEARING RESTRICTIONS Yes NWB  FALLS:  Has patient fallen in last 6 months? No  LIVING ENVIRONMENT: Lives with: lives with their spouse Lives in: House/apartment Stairs: Yes, steps to enter and 2nd floor home gym  Has following equipment at home: Gilford Rile - 2 wheeled, Crutches, and knee scooter  OCCUPATION: retired   PLOF: Independent, has membership aquatic center   PATIENT GOALS : Pt states she would like to play golf and return exercising/walking/ bike/  pickleball   OBJECTIVE:   DIAGNOSTIC FINDINGS:   IMPRESSION: 1. Complete insertional rupture of the Achilles tendon with moderate retraction. Underlying Achilles tendinosis with reactive edema in the calcaneal tuberosity. 2. Mild attenuation of the flexor digitorum longus tendon within the plantar aspect of the midfoot without evidence of focal tear. The additional ankle tendons appear normal. 3. 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 4. Mild tibiotalar and midfoot degenerative changes.  PATIENT SURVEYS:  FOTO 45 48 DC 12 pts MCII      FOTO 10th visit 8/16 59 pts   LOWER EXTREMITY ROM:  Active ROM Right eval R  7/10 7/17 8/14 8/16  Left eval  Ankle dorsiflexion -30 0 _0 with OP 6  Ankle plantarflexion 50    50 55  Ankle inversion _1 Ankle eversion _2 (Blank rows = not tested)  MMT Right 8/16  Ankle dorsiflexion 4+/5  Ankle plantarflexion 2+/5  Ankle inversion 4+/5  Ankle eversion 4/5   GAIT: 67f decrease in R stance time, early heel off, decreased L step length  TODAY'S TREATMENT  9/14   R TCJ post glide mob grade III-IV  Self ankle DF mob green  Toe walking 3x laps 360f 13lb KB squat (standing on plate for more ROM) TRX Deep squat holds 3s 15x Rebounder 2lb ball 2x20 SLS with L toe touch PRN Sidelying shuttle press 50lbs 3x10   9/11  R TCJ post glide mob grade III-IV  HR off step 2s hold 2x10 SL balance on airex 20s 3x SL shuttle calf raise 75lbs 3x10 6" box  fwd heel tap/step down 10x each White leg press 65lbs 3x10 SL calf raise with toe touch 2x10 (discussed for home)     8/14   R TCJ post glide mob grade III-IV STM R fibularis group  Standing HR up 2 down1 3x10 4" box  fwd heel tap/step down 2x10 SL balance 30s 4x Gastroc stretch 30s 3x with foot prop  Mini squat 2x10 to low table height (feel stretch in calf at bottom)  Shuttle leg press SL 75lbs 3x10  SL calf raise 31lbs 3x10   8/9     R TCJ post glide mob grade III-IV IASTM R achilles scar    Shuttle calf raise, staggered stance 50lbs 2x10 2s hold DL shuttle press 87lbs 3x10 SL balance 30s 4x Gastroc stretch 30s 3x Mini squat 2x10 to low table height (feel stretch in calf at bottom)  Step up  2x10 8"   8/7  Pt seen for aquatic therapy today.  Treatment took place in [facility] 3.25[facility] Temp of [facility] was 91.  Pt entered/exited the pool via stairs  independently with bilat rail. (Step to)    Warm up: 3x fwd, lateral, and retro marching   Exercises: Heel cord stretch at steps 15x 4x  Step ups lateral and fwd at bottom step 2x10 (focus on slow  lower, weight through middle of foot)  Toe walking 3x laps (xiphoid process deep) Tandem walking 3x widths SL balance 30s 3x SL HR in chest deep 2x20 Standing hip ABD 2x20 bilat intermittent UE Mini squat on bottom step 3x10 Golf swing rotation with R LE push off 20x Black TB Paloff press 2x10 Fwd lunge 2x10 without UE    Pt requires buoyancy for support and to offload joints with strengthening exercises. Viscosity of  the water is needed for resistance of strengthening; water current perturbations provides challenge to standing balance unsupported, requiring increased core activation.  8/2  Pt seen for aquatic therapy today.  Treatment took place in water 3.25-4 ft in depth at the [facility] pool. Temp of water was 91.  Pt entered/exited the pool via stairs  independently with bilat rail. (Step to)    Warm up: 3x sidestepping, retro walking, fwd walking    Exercises: Marching 3x widths Standing gastroc stretch 30s 3x Tandem walking 3x widths SL balance 30s 3x HR/TR 2x20 without UE Standing hip ABD 2x10 bilat intermittent UE Mini squat without UE 2x20 Fwd lunge 2x10 with UE    Pt requires buoyancy for support and to offload joints with strengthening exercises. Viscosity of the water is needed for resistance of strengthening; water current perturbations provides challenge to standing balance unsupported, requiring increased core activation.  7/31  R TCJ post glide mob grade III-IV  Shuttle calf raise, double leg 31lbs 2x10 DL leg press shuttle 3x10 81 lbs  SL balance (attempted but unable) Sidestepping at bar YTB at ankle 3x laps Tandem balance 30s 3x 4 way ankle RTB 20x each Fig 4 Bridge 2x10 Standing DL calf raise with counter support 2x10   7/17  R TCJ post glide mob grade III-IV  Seated ankle DF stretch 10s 10x Standing weight shifting without boot 5s 10x Heel toe rocking with boot on 2x10  Gait training: Single crutch, boot on; focus on improving R  stance time, decrease L trunk lean, possible L shoe lift to reduce L lean and back pain    08/02/21:  Stretches R gastroc to tolerance 5x30 seconds, joint mobs grade III all directions to tolerance   SLRs R LE 1x15 Sidelying Hip ABD 1x15 B Prone hip ABD 1x15 B  Review of toe yoga, also towel crunches, arch raises   Gait training WBAT in boot with B crutches Min cues for technique/sequencing, then stair training 2 steps in gym B crutches min guard for safety/Mod cues for sequencing       PATIENT EDUCATION:  Education details: exercise progress, safety/surgical protection, muscle firing,  envelope of function, HEP, POC  Person educated: Patient Education method: Explanation, Demonstration, Tactile cues, Verbal cues, and Handouts Education comprehension: verbalized understanding, returned demonstration, verbal cues required, and tactile cues required   HOME EXERCISE PROGRAM: Access Code: [email or web address] URL: [email or web address] Date: 07/24/2021 Prepared by: Daleen Bo  ASSESSMENT:  CLINICAL IMPRESSION: Pt able to continue with progress of ankle DF ROM and PF strength today without issue. Pt's calf does fatigue very quickly with BW loaded strengthening but pt is able to maintain PF position with sustained isometric in toe walking. Pt is still limited with ankle joint stiffness so pt provided with video resource and demo'd self banded ankle mobilization for home. At this time pt is cleared to play 9 holes of golf and should be able to perform all swings without complication. Pt still advised to not jump or run at this time. Plan to update HEP at next. Pt able to decrease to 1x/week at this time.  Pt would benefit from continued skilled therapy in order to reach goals and maximize functional R LE strength and ROM for full return to PLOF.    OBJECTIVE IMPAIRMENTS Abnormal gait, decreased activity tolerance, decreased balance, decreased endurance, decreased knowledge of use of  DME, decreased mobility, difficulty walking, decreased ROM, decreased strength, hypomobility, increased edema, increased fascial restrictions, impaired flexibility, impaired sensation, improper body  mechanics, postural dysfunction, and pain.   ACTIVITY LIMITATIONS carrying, lifting, standing, squatting, stairs, transfers, bed mobility, bathing, toileting, dressing, locomotion level, and caring for others  PARTICIPATION LIMITATIONS: meal prep, cleaning, laundry, interpersonal relationship, driving, shopping, community activity, yard work, and exercise  PERSONAL FACTORS Age  affecting patient's functional outcome.   REHAB POTENTIAL: Good  CLINICAL DECISION MAKING: Stable/uncomplicated  EVALUATION COMPLEXITY: Low   GOALS:   SHORT TERM GOALS: Target date: 09/04/2021  Pt will become independent with initial HEP in order to demonstrate synthesis of PT education.   Goal status: MET  2.  Pt will be able to demonstrate FWB and normalized gait in order to demonstrate functional improvement in LE function for self-care and house hold duties.   Goal status: MET  3.  Pt will score at least 12 pt increase on FOTO to demonstrate functional improvement in MCII and pt perceived function.     Goal status: MET    LONG TERM GOALS: Target date: 10/16/2021   Pt  will become independent with final HEP in order to demonstrate synthesis of PT education.   Goal status: ongoing  2.  Pt will score >/= 65 on FOTO to demonstrate improvement in perceived R LE function.   Goal status: ongoing  3.  Pt will be able to demonstrate/report ability to walk >30 mins without pain in order to demonstrate functional improvement and tolerance to exercise and community mobility.   Goal status: partially met  4.  Pt will be able to demonstrate ability to perform 20 consecutive SL calf raise in order to demonstrate functional improvement in LE function for functional improvement in SL strength and stability for  return to PLOF.  Goal status: ongoing   PLAN: PT FREQUENCY: 1-2x/week  PT DURATION: 12 weeks  PLANNED INTERVENTIONS: Therapeutic exercises, Therapeutic activity, Neuromuscular re-education, Balance training, Gait training, Patient/Family education, Joint manipulation, Joint mobilization, Stair training, Orthotic/Fit training, DME instructions, Aquatic Therapy, Dry Needling, Electrical stimulation, Spinal manipulation, Spinal mobilization, Cryotherapy, Moist heat, Compression bandaging, scar mobilization, Splintting, Taping, Vasopneumatic device, Traction, Ultrasound, Ionotophoresis 69m/ml Dexamethasone, Manual therapy, and Re-evaluation  PLAN FOR NEXT SESSION: progress strength as able and tolerated  ADaleen BoPT, DPT 10/05/21 10:14 AM

## 2021-10-09 ENCOUNTER — Ambulatory Visit (HOSPITAL_BASED_OUTPATIENT_CLINIC_OR_DEPARTMENT_OTHER): Payer: Medicare Other | Admitting: Physical Therapy

## 2021-10-09 ENCOUNTER — Encounter (HOSPITAL_BASED_OUTPATIENT_CLINIC_OR_DEPARTMENT_OTHER): Payer: Self-pay | Admitting: Physical Therapy

## 2021-10-09 DIAGNOSIS — M25571 Pain in right ankle and joints of right foot: Secondary | ICD-10-CM

## 2021-10-09 DIAGNOSIS — R262 Difficulty in walking, not elsewhere classified: Secondary | ICD-10-CM

## 2021-10-09 DIAGNOSIS — M25671 Stiffness of right ankle, not elsewhere classified: Secondary | ICD-10-CM

## 2021-10-09 DIAGNOSIS — M6281 Muscle weakness (generalized): Secondary | ICD-10-CM

## 2021-10-09 NOTE — Therapy (Signed)
OUTPATIENT PHYSICAL TREATMENT NOTE   Patient Name: Kendra Norman MRN: [id number] DOB:11-20-1955, 66 y.o., female Today's Date: 10/09/2021   PT End of Session - 10/09/21 0927     Visit Number 13    Number of Visits 25    Date for PT Re-Evaluation 10/22/21    Authorization Type MCR    Progress Note Due on Visit 10    PT Start Time 0930    PT Stop Time 1004    PT Time Calculation (min) 34 min    Equipment Utilized During Treatment Gait belt    Activity Tolerance Patient tolerated treatment well    Behavior During Therapy WFL for tasks assessed/performed                    Past Medical History:  Diagnosis Date   GERD (gastroesophageal reflux disease)    Seasonal allergies    Past Surgical History:  Procedure Laterality Date   ABDOMINAL HYSTERECTOMY     BREAST BIOPSY     BREAST EXCISIONAL BIOPSY Bilateral    fibroadenomas - benign   BREAST LUMPECTOMY Bilateral    fibroadenomas - benign   VEIN SURGERY     Patient Active Problem List   Diagnosis Date Noted   Strain of muscle, fascia and tendon of lower back, initial encounter 10/16/2018    PCP: Aletha Halim., PA-C  REFERRING PROVIDER: Criselda Peaches, DPM  REFERRING DIAG: [phone] (ICD-10-CM) - Rupture of right Achilles tendon, initial encounter  THERAPY DIAG:  Stiffness of right ankle, not elsewhere classified  Muscle weakness (generalized)  Difficulty walking  Pain in right ankle and joints of right foot  Rationale for Evaluation and Treatment Rehabilitation  ONSET DATE: 06/07/2021 REPAIR ACHILLES TENDON, REMOVAL OF BONE SPUR, CALF MUSCLE LENGTHENING, TENDON TRANSFER RIGHT   SUBJECTIVE:   SUBJECTIVE STATEMENT:  Pt is 17 wks 5 days.   Pt states she had no issues after last session. Pt states she was sore for 2 days after. Her foot might be more swollen today due to travel.   Eval:  Pt states in back in the Fall, she had some what she thought was plantar fasciitis. In January, she had  injections and was put into the put. In April, she had PRP. In May she was putting on pants and felt that the "ankle went." Balance on the R leg. She had surgery 5/17. Pt plays golf. She also does water running. Husband at home helping.   Pt states she walks a lot. She even walks 30 min at a time with the knee scooter. Pain is well managed. No pain since day 3 post op. Pt denies signs of DVT.   PAIN:  Are you having pain? No: NPRS scale: 0/10 Pain location: R ankle incision Pain description: sharp, NT on top of    PRECAUTIONS: Other: Achilles  Per MD: 06/07/21 Surgery. Begin PT after 07/24/21, NWB exercise on strength and ROM until 7/17, then begin WB exercises; update 7/12 per Dr. Sherryle Lis Epic secure chat WBAT in boot but no WB therapy exercises until next week   WEIGHT BEARING RESTRICTIONS Yes NWB  FALLS:  Has patient fallen in last 6 months? No  LIVING ENVIRONMENT: Lives with: lives with their spouse Lives in: House/apartment Stairs: Yes, steps to enter and 2nd floor home gym  Has following equipment at home: Gilford Rile - 2 wheeled, Crutches, and knee scooter  OCCUPATION: retired   PLOF: Independent, has membership aquatic center   PATIENT GOALS :  Pt states she would like to play golf and return exercising/walking/ bike/ pickleball   OBJECTIVE:   DIAGNOSTIC FINDINGS:   IMPRESSION: 1. Complete insertional rupture of the Achilles tendon with moderate retraction. Underlying Achilles tendinosis with reactive edema in the calcaneal tuberosity. 2. Mild attenuation of the flexor digitorum longus tendon within the plantar aspect of the midfoot without evidence of focal tear. The additional ankle tendons appear normal. 3. 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 4. Mild tibiotalar and midfoot degenerative changes.  PATIENT SURVEYS:  FOTO 45 33 DC 12 pts MCII      FOTO 10th visit 8/16 59 pts   LOWER EXTREMITY ROM:  Active ROM  Right eval R  7/10 7/17 8/14 8/16 Left eval  Ankle dorsiflexion -30 0 3 10 10  with OP 6  Ankle plantarflexion 50    50 55  Ankle inversion 12    27 30   Ankle eversion 8    9 20    (Blank rows = not tested)  MMT Right 8/16  Ankle dorsiflexion 4+/5  Ankle plantarflexion 2+/5  Ankle inversion 4+/5  Ankle eversion 4/5     TODAY'S TREATMENT  9/18  Self ankle DF mob green  Toe walking 3x laps 36f  Resisted cable walkouts, retro and fwd 22.5 lbs 2x5 TRX Deep squat holds 3s 15x Rebounder 2lb ball 2x20 SLS with L toe touch PRN; fwd and lateral Cable chops 10x eachh 22.5 lbs   Discussion of pool based plyometrics, return to swimming and golf  9/14   R TCJ post glide mob grade III-IV  Self ankle DF mob green  Toe walking 3x laps 392f 13lb KB squat (standing on plate for more ROM) TRX Deep squat holds 3s 15x Rebounder 2lb ball 2x20 SLS with L toe touch PRN Sidelying shuttle press 50lbs 3x10   9/11  R TCJ post glide mob grade III-IV  HR off step 2s hold 2x10 SL balance on airex 20s 3x SL shuttle calf raise 75lbs 3x10 6" box  fwd heel tap/step down 10x each White leg press 65lbs 3x10 SL calf raise with toe touch 2x10 (discussed for home)     8/14   R TCJ post glide mob grade III-IV STM R fibularis group  Standing HR up 2 down1 3x10 4" box  fwd heel tap/step down 2x10 SL balance 30s 4x Gastroc stretch 30s 3x with foot prop  Mini squat 2x10 to low table height (feel stretch in calf at bottom)  Shuttle leg press SL 75lbs 3x10  SL calf raise 31lbs 3x10   8/9     R TCJ post glide mob grade III-IV IASTM R achilles scar    Shuttle calf raise, staggered stance 50lbs 2x10 2s hold DL shuttle press 87lbs 3x10 SL balance 30s 4x Gastroc stretch 30s 3x Mini squat 2x10 to low table height (feel stretch in calf at bottom)  Step up  2x10 8"     PATIENT EDUCATION:  Education details: exercise progress, safety/surgical protection, muscle firing,  envelope of  function, HEP, POC  Person educated: Patient Education method: Explanation, Demonstration, Tactile cues, Verbal cues, and Handouts Education comprehension: verbalized understanding, returned demonstration, verbal cues required, and tactile cues required   HOME EXERCISE PROGRAM: Access Code: [email or web address] URL: [email or web address] Date: 07/24/2021 Prepared by: AlDaleen BoASSESSMENT:  CLINICAL IMPRESSION: Pt able to continue with PF strength loading. At this time pt's largest focus is being able to perform SL calf raises and focusing on PF  strength. HEP updated today and pt advised to work on ankle stability, calf endurance, and PF strength. Pt advised on returning to previous level of exercise and cleared to start low level plyometrics in the pool setting. Pt advised on no plyos or running while on land. Plan to progress strength of R triceps surae as able at next. Consider SL TRX assisted squats and machine calf raise. Pt would benefit from continued skilled therapy in order to reach goals and maximize functional R LE strength and ROM for full return to PLOF.    OBJECTIVE IMPAIRMENTS Abnormal gait, decreased activity tolerance, decreased balance, decreased endurance, decreased knowledge of use of DME, decreased mobility, difficulty walking, decreased ROM, decreased strength, hypomobility, increased edema, increased fascial restrictions, impaired flexibility, impaired sensation, improper body mechanics, postural dysfunction, and pain.   ACTIVITY LIMITATIONS carrying, lifting, standing, squatting, stairs, transfers, bed mobility, bathing, toileting, dressing, locomotion level, and caring for others  PARTICIPATION LIMITATIONS: meal prep, cleaning, laundry, interpersonal relationship, driving, shopping, community activity, yard work, and exercise  PERSONAL FACTORS Age  affecting patient's functional outcome.   REHAB POTENTIAL: Good  CLINICAL DECISION MAKING:  Stable/uncomplicated  EVALUATION COMPLEXITY: Low   GOALS:   SHORT TERM GOALS: Target date: 09/04/2021  Pt will become independent with initial HEP in order to demonstrate synthesis of PT education.   Goal status: MET  2.  Pt will be able to demonstrate FWB and normalized gait in order to demonstrate functional improvement in LE function for self-care and house hold duties.   Goal status: MET  3.  Pt will score at least 12 pt increase on FOTO to demonstrate functional improvement in MCII and pt perceived function.     Goal status: MET    LONG TERM GOALS: Target date: 10/16/2021   Pt  will become independent with final HEP in order to demonstrate synthesis of PT education.   Goal status: ongoing  2.  Pt will score >/= 65 on FOTO to demonstrate improvement in perceived R LE function.   Goal status: ongoing  3.  Pt will be able to demonstrate/report ability to walk >30 mins without pain in order to demonstrate functional improvement and tolerance to exercise and community mobility.   Goal status: partially met  4.  Pt will be able to demonstrate ability to perform 20 consecutive SL calf raise in order to demonstrate functional improvement in LE function for functional improvement in SL strength and stability for return to PLOF.  Goal status: ongoing   PLAN: PT FREQUENCY: 1-2x/week  PT DURATION: 12 weeks  PLANNED INTERVENTIONS: Therapeutic exercises, Therapeutic activity, Neuromuscular re-education, Balance training, Gait training, Patient/Family education, Joint manipulation, Joint mobilization, Stair training, Orthotic/Fit training, DME instructions, Aquatic Therapy, Dry Needling, Electrical stimulation, Spinal manipulation, Spinal mobilization, Cryotherapy, Moist heat, Compression bandaging, scar mobilization, Splintting, Taping, Vasopneumatic device, Traction, Ultrasound, Ionotophoresis 36m/ml Dexamethasone, Manual therapy, and Re-evaluation  PLAN FOR NEXT SESSION:  progress strength as able and tolerated  ADaleen BoPT, DPT 10/09/21 10:08 AM

## 2021-10-12 ENCOUNTER — Encounter (HOSPITAL_BASED_OUTPATIENT_CLINIC_OR_DEPARTMENT_OTHER): Payer: Medicare Other | Admitting: Physical Therapy

## 2021-10-17 ENCOUNTER — Encounter (HOSPITAL_BASED_OUTPATIENT_CLINIC_OR_DEPARTMENT_OTHER): Payer: Self-pay | Admitting: Physical Therapy

## 2021-10-17 ENCOUNTER — Ambulatory Visit (HOSPITAL_BASED_OUTPATIENT_CLINIC_OR_DEPARTMENT_OTHER): Payer: Medicare Other | Admitting: Physical Therapy

## 2021-10-17 DIAGNOSIS — M25671 Stiffness of right ankle, not elsewhere classified: Secondary | ICD-10-CM

## 2021-10-17 DIAGNOSIS — M6281 Muscle weakness (generalized): Secondary | ICD-10-CM

## 2021-10-17 DIAGNOSIS — R262 Difficulty in walking, not elsewhere classified: Secondary | ICD-10-CM

## 2021-10-17 DIAGNOSIS — M25571 Pain in right ankle and joints of right foot: Secondary | ICD-10-CM

## 2021-10-17 NOTE — Therapy (Signed)
OUTPATIENT PHYSICAL TREATMENT NOTE   Patient Name: Kendra Norman MRN: [id number] DOB:1955/11/27, 66 y.o., female Today's Date: 10/17/2021   PT End of Session - 10/17/21 1004     Visit Number 14    Number of Visits 25    Date for PT Re-Evaluation 10/22/21    Authorization Type MCR    Progress Note Due on Visit 10    PT Start Time 0930    PT Stop Time 1000    PT Time Calculation (min) 30 min    Equipment Utilized During Treatment Gait belt    Activity Tolerance Patient tolerated treatment well    Behavior During Therapy WFL for tasks assessed/performed                     Past Medical History:  Diagnosis Date   GERD (gastroesophageal reflux disease)    Seasonal allergies    Past Surgical History:  Procedure Laterality Date   ABDOMINAL HYSTERECTOMY     BREAST BIOPSY     BREAST EXCISIONAL BIOPSY Bilateral    fibroadenomas - benign   BREAST LUMPECTOMY Bilateral    fibroadenomas - benign   VEIN SURGERY     Patient Active Problem List   Diagnosis Date Noted   Strain of muscle, fascia and tendon of lower back, initial encounter 10/16/2018    PCP: Aletha Halim., PA-C  REFERRING PROVIDER: Criselda Peaches, DPM  REFERRING DIAG: [phone] (ICD-10-CM) - Rupture of right Achilles tendon, initial encounter  THERAPY DIAG:  Stiffness of right ankle, not elsewhere classified  Muscle weakness (generalized)  Difficulty walking  Pain in right ankle and joints of right foot  Rationale for Evaluation and Treatment Rehabilitation  ONSET DATE: 06/07/2021 REPAIR ACHILLES TENDON, REMOVAL OF BONE SPUR, CALF MUSCLE LENGTHENING, TENDON TRANSFER RIGHT   SUBJECTIVE:   SUBJECTIVE STATEMENT:  Pt is 17 wks 5 days.   Pt states that she states the heel was bothering her over the weekend while she was walking. She states the Achilles tendon was very painful and like tendinitis. Pt states it feels warm and it is painful at rest. Denies feeling of popping/giving  way.  Eval:  Pt states in back in the Fall, she had some what she thought was plantar fasciitis. In January, she had injections and was put into the put. In April, she had PRP. In May she was putting on pants and felt that the "ankle went." Balance on the R leg. She had surgery 5/17. Pt plays golf. She also does water running. Husband at home helping.   Pt states she walks a lot. She even walks 30 min at a time with the knee scooter. Pain is well managed. No pain since day 3 post op. Pt denies signs of DVT.   PAIN:  Are you having pain? No: NPRS scale: 0/10 Pain location: R ankle incision Pain description: sharp, NT on top of    PRECAUTIONS: Other: Achilles  Per MD: 06/07/21 Surgery. Begin PT after 07/24/21, NWB exercise on strength and ROM until 7/17, then begin WB exercises; update 7/12 per Dr. Sherryle Lis Epic secure chat WBAT in boot but no WB therapy exercises until next week   WEIGHT BEARING RESTRICTIONS Yes NWB  FALLS:  Has patient fallen in last 6 months? No  LIVING ENVIRONMENT: Lives with: lives with their spouse Lives in: House/apartment Stairs: Yes, steps to enter and 2nd floor home gym  Has following equipment at home: Gilford Rile - 2 wheeled, Crutches, and knee scooter  OCCUPATION: retired   PLOF: Independent, has membership aquatic center   PATIENT GOALS : Pt states she would like to play golf and return exercising/walking/ bike/ pickleball   OBJECTIVE:   DIAGNOSTIC FINDINGS:   IMPRESSION: 1. Complete insertional rupture of the Achilles tendon with moderate retraction. Underlying Achilles tendinosis with reactive edema in the calcaneal tuberosity. 2. Mild attenuation of the flexor digitorum longus tendon within the plantar aspect of the midfoot without evidence of focal tear. The additional ankle tendons appear normal. 3. 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 4. Mild tibiotalar and midfoot degenerative  changes.  PATIENT SURVEYS:  FOTO 45 66 DC 12 pts MCII      FOTO 10th visit 8/16 59 pts   LOWER EXTREMITY ROM:  Active ROM Right eval R  7/10 7/17 8/14 8/16 Left eval  Ankle dorsiflexion -30 0 3 10 10  with OP 6  Ankle plantarflexion 50    50 55  Ankle inversion 12    27 30   Ankle eversion 8    9 20    (Blank rows = not tested)  MMT Right 8/16  Ankle dorsiflexion 4+/5  Ankle plantarflexion 2+/5  Ankle inversion 4+/5  Ankle eversion 4/5     TODAY'S TREATMENT  9/26  (-) Thompson's (-) Step off deformity TTP at insertion site  STM R gastroc soleus Cross friction massage into Achilles insertion site- mild warmth noted but no erythema DL woodpeckers 5s 10x  Discussion of activity modification, rest/recovery period; taper and ramping back into exercise  9/18  Self ankle DF mob green  Toe walking 3x laps 49f  Resisted cable walkouts, retro and fwd 22.5 lbs 2x5 TRX Deep squat holds 3s 15x Rebounder 2lb ball 2x20 SLS with L toe touch PRN; fwd and lateral Cable chops 10x eachh 22.5 lbs   Discussion of pool based plyometrics, return to swimming and golf  9/14   R TCJ post glide mob grade III-IV  Self ankle DF mob green  Toe walking 3x laps 363f 13lb KB squat (standing on plate for more ROM) TRX Deep squat holds 3s 15x Rebounder 2lb ball 2x20 SLS with L toe touch PRN Sidelying shuttle press 50lbs 3x10   9/11  R TCJ post glide mob grade III-IV  HR off step 2s hold 2x10 SL balance on airex 20s 3x SL shuttle calf raise 75lbs 3x10 6" box  fwd heel tap/step down 10x each White leg press 65lbs 3x10 SL calf raise with toe touch 2x10 (discussed for home)     8/14   R TCJ post glide mob grade III-IV STM R fibularis group  Standing HR up 2 down1 3x10 4" box  fwd heel tap/step down 2x10 SL balance 30s 4x Gastroc stretch 30s 3x with foot prop  Mini squat 2x10 to low table height (feel stretch in calf at bottom)  Shuttle leg press SL 75lbs 3x10   SL calf raise 31lbs 3x10   8/9     R TCJ post glide mob grade III-IV IASTM R achilles scar    Shuttle calf raise, staggered stance 50lbs 2x10 2s hold DL shuttle press 87lbs 3x10 SL balance 30s 4x Gastroc stretch 30s 3x Mini squat 2x10 to low table height (feel stretch in calf at bottom)  Step up  2x10 8"     PATIENT EDUCATION:  Education details: exercise progress, safety/surgical protection, muscle firing,  envelope of function, HEP, POC  Person educated: Patient Education method: Explanation, Demonstration, Tactile cues, Verbal  cues, and Handouts Education comprehension: verbalized understanding, returned demonstration, verbal cues required, and tactile cues required   HOME EXERCISE PROGRAM: Access Code: [email or web address] URL: [email or web address] Date: 07/24/2021 Prepared by: Daleen Bo  ASSESSMENT:  CLINICAL IMPRESSION: Pt prsents today with report of s/s consistent with a tendinitis type irritation, especially given report of increase in daily exercise/activity. Pt advised to reduce loading of the ankle for a short period and then to slowly progress back into exercise once she is no longer in pain at rest. Pt to slowly progress strengthening and to avoid impact activity at this time. Pt given verbal HEP of isometrics and reduced repetitions in order to promote pain reduction and recovery before returning back to previous level of HEP. Repair intact and without signs of damage. Pt would benefit from continued skilled therapy in order to reach goals and maximize functional R LE strength and ROM for full return to PLOF.    OBJECTIVE IMPAIRMENTS Abnormal gait, decreased activity tolerance, decreased balance, decreased endurance, decreased knowledge of use of DME, decreased mobility, difficulty walking, decreased ROM, decreased strength, hypomobility, increased edema, increased fascial restrictions, impaired flexibility, impaired sensation, improper body mechanics,  postural dysfunction, and pain.   ACTIVITY LIMITATIONS carrying, lifting, standing, squatting, stairs, transfers, bed mobility, bathing, toileting, dressing, locomotion level, and caring for others  PARTICIPATION LIMITATIONS: meal prep, cleaning, laundry, interpersonal relationship, driving, shopping, community activity, yard work, and exercise  PERSONAL FACTORS Age  affecting patient's functional outcome.   REHAB POTENTIAL: Good  CLINICAL DECISION MAKING: Stable/uncomplicated  EVALUATION COMPLEXITY: Low   GOALS:   SHORT TERM GOALS: Target date: 09/04/2021  Pt will become independent with initial HEP in order to demonstrate synthesis of PT education.   Goal status: MET  2.  Pt will be able to demonstrate FWB and normalized gait in order to demonstrate functional improvement in LE function for self-care and house hold duties.   Goal status: MET  3.  Pt will score at least 12 pt increase on FOTO to demonstrate functional improvement in MCII and pt perceived function.     Goal status: MET    LONG TERM GOALS: Target date: 10/16/2021   Pt  will become independent with final HEP in order to demonstrate synthesis of PT education.   Goal status: ongoing  2.  Pt will score >/= 65 on FOTO to demonstrate improvement in perceived R LE function.   Goal status: ongoing  3.  Pt will be able to demonstrate/report ability to walk >30 mins without pain in order to demonstrate functional improvement and tolerance to exercise and community mobility.   Goal status: partially met  4.  Pt will be able to demonstrate ability to perform 20 consecutive SL calf raise in order to demonstrate functional improvement in LE function for functional improvement in SL strength and stability for return to PLOF.  Goal status: ongoing   PLAN: PT FREQUENCY: 1-2x/week  PT DURATION: 12 weeks  PLANNED INTERVENTIONS: Therapeutic exercises, Therapeutic activity, Neuromuscular re-education, Balance  training, Gait training, Patient/Family education, Joint manipulation, Joint mobilization, Stair training, Orthotic/Fit training, DME instructions, Aquatic Therapy, Dry Needling, Electrical stimulation, Spinal manipulation, Spinal mobilization, Cryotherapy, Moist heat, Compression bandaging, scar mobilization, Splintting, Taping, Vasopneumatic device, Traction, Ultrasound, Ionotophoresis 64m/ml Dexamethasone, Manual therapy, and Re-evaluation  PLAN FOR NEXT SESSION: progress strength as able and tolerated  ADaleen BoPT, DPT 10/17/21 10:13 AM

## 2021-10-19 ENCOUNTER — Encounter (HOSPITAL_BASED_OUTPATIENT_CLINIC_OR_DEPARTMENT_OTHER): Payer: Medicare Other | Admitting: Physical Therapy

## 2021-10-23 ENCOUNTER — Ambulatory Visit (HOSPITAL_BASED_OUTPATIENT_CLINIC_OR_DEPARTMENT_OTHER): Payer: Medicare Other | Admitting: Physical Therapy

## 2021-10-30 ENCOUNTER — Ambulatory Visit (HOSPITAL_BASED_OUTPATIENT_CLINIC_OR_DEPARTMENT_OTHER): Payer: Medicare Other | Attending: Podiatry | Admitting: Physical Therapy

## 2021-10-30 ENCOUNTER — Encounter (HOSPITAL_BASED_OUTPATIENT_CLINIC_OR_DEPARTMENT_OTHER): Payer: Self-pay | Admitting: Physical Therapy

## 2021-10-30 DIAGNOSIS — R262 Difficulty in walking, not elsewhere classified: Secondary | ICD-10-CM | POA: Diagnosis present

## 2021-10-30 DIAGNOSIS — M25571 Pain in right ankle and joints of right foot: Secondary | ICD-10-CM | POA: Insufficient documentation

## 2021-10-30 DIAGNOSIS — M25671 Stiffness of right ankle, not elsewhere classified: Secondary | ICD-10-CM | POA: Insufficient documentation

## 2021-10-30 DIAGNOSIS — M6281 Muscle weakness (generalized): Secondary | ICD-10-CM | POA: Diagnosis present

## 2021-10-30 NOTE — Therapy (Signed)
OUTPATIENT PHYSICAL TREATMENT NOTE   Patient Name: Kendra Norman MRN: [id number] DOB:17-May-1955, 66 y.o., female Today's Date: 10/30/2021   PT End of Session - 10/30/21 0855     Visit Number 15    Number of Visits 25    Date for PT Re-Evaluation 01/20/22    Authorization Type MCR    Progress Note Due on Visit 10    PT Start Time 0802    PT Stop Time 0840    PT Time Calculation (min) 38 min    Equipment Utilized During Treatment Gait belt    Activity Tolerance Patient tolerated treatment well    Behavior During Therapy WFL for tasks assessed/performed                      Past Medical History:  Diagnosis Date   GERD (gastroesophageal reflux disease)    Seasonal allergies    Past Surgical History:  Procedure Laterality Date   ABDOMINAL HYSTERECTOMY     BREAST BIOPSY     BREAST EXCISIONAL BIOPSY Bilateral    fibroadenomas - benign   BREAST LUMPECTOMY Bilateral    fibroadenomas - benign   VEIN SURGERY     Patient Active Problem List   Diagnosis Date Noted   Strain of muscle, fascia and tendon of lower back, initial encounter 10/16/2018    PCP: Aletha Halim., PA-C  REFERRING PROVIDER: Criselda Peaches, DPM  REFERRING DIAG: [phone] (ICD-10-CM) - Rupture of right Achilles tendon, initial encounter  THERAPY DIAG:  Stiffness of right ankle, not elsewhere classified - Plan: PT plan of care cert/re-cert  Muscle weakness (generalized) - Plan: PT plan of care cert/re-cert  Difficulty walking - Plan: PT plan of care cert/re-cert  Pain in right ankle and joints of right foot - Plan: PT plan of care cert/re-cert  Rationale for Evaluation and Treatment Rehabilitation  ONSET DATE: 06/07/2021 REPAIR ACHILLES TENDON, REMOVAL OF BONE SPUR, CALF MUSCLE LENGTHENING, TENDON TRANSFER RIGHT   SUBJECTIVE:   SUBJECTIVE STATEMENT:  Pt is 17 wks 5 days.   Pt states that she states the heel is much better but it still bothers her with the toe walking. Worst is  4/10. Pt is now able to chip and putt on the golf course.  Eval:  Pt states in back in the Fall, she had some what she thought was plantar fasciitis. In January, she had injections and was put into the put. In April, she had PRP. In May she was putting on pants and felt that the "ankle went." Balance on the R leg. She had surgery 5/17. Pt plays golf. She also does water running. Husband at home helping.   Pt states she walks a lot. She even walks 30 min at a time with the knee scooter. Pain is well managed. No pain since day 3 post op. Pt denies signs of DVT.   PAIN:  Are you having pain? Yes: NPRS scale: 1/10 Pain location: R ankle incision Pain description: sharp, NT on top of    PRECAUTIONS: Other: Achilles  WEIGHT BEARING RESTRICTIONS Yes NWB  FALLS:  Has patient fallen in last 6 months? No  LIVING ENVIRONMENT: Lives with: lives with their spouse Lives in: House/apartment Stairs: Yes, steps to enter and 2nd floor home gym  Has following equipment at home: Gilford Rile - 2 wheeled, Crutches, and knee scooter  OCCUPATION: retired   PLOF: Independent, has membership aquatic center   PATIENT GOALS : Pt states she would like to  play golf and return exercising/walking/ bike/ pickleball   OBJECTIVE:   DIAGNOSTIC FINDINGS:   IMPRESSION: 1. Complete insertional rupture of the Achilles tendon with moderate retraction. Underlying Achilles tendinosis with reactive edema in the calcaneal tuberosity. 2. Mild attenuation of the flexor digitorum longus tendon within the plantar aspect of the midfoot without evidence of focal tear. The additional ankle tendons appear normal. 3. 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 4. Mild tibiotalar and midfoot degenerative changes.  PATIENT SURVEYS:  FOTO 45 71 DC 12 pts MCII      FOTO 10th visit 8/16 59 pts   LOWER EXTREMITY ROM:  Active ROM Right eval R  7/10 7/17 8/14 8/16 10/9 Left eval   Ankle dorsiflexion -30 0 _0 with OP 10 6  Ankle plantarflexion 50    50 55 55  Ankle inversion 12    27 WFL 30  Ankle eversion 8    9 WFL 20   (Blank rows = not tested)  MMT Right 8/16 L 10/9  Ankle dorsiflexion 4+/5 5/5  Ankle plantarflexion 2+/5 4/5  Ankle inversion 4+/5 4+/5  Ankle eversion 4/5 4+/5     TODAY'S TREATMENT  10/9   Post ankle TCJ mob grade IV  Wobble board AP taps 2x20 Shuttle DL hops 2x15 DL woodpeckers 5s 10x SL RDL with roller 3x10 TRX SL squat 3x8 SL box squat 24 in box 3x8  9/26  (-) Thompson's (-) Step off deformity TTP at insertion site  STM R gastroc soleus Cross friction massage into Achilles insertion site- mild warmth noted but no erythema DL woodpeckers 5s 10x  Discussion of activity modification, rest/recovery period; taper and ramping back into exercise  9/18  Self ankle DF mob green  Toe walking 3x laps 39f  Resisted cable walkouts, retro and fwd 22.5 lbs 2x5 TRX Deep squat holds 3s 15x Rebounder 2lb ball 2x20 SLS with L toe touch PRN; fwd and lateral Cable chops 10x eachh 22.5 lbs   Discussion of pool based plyometrics, return to swimming and golf  9/14   R TCJ post glide mob grade III-IV  Self ankle DF mob green  Toe walking 3x laps 348f 13lb KB squat (standing on plate for more ROM) TRX Deep squat holds 3s 15x Rebounder 2lb ball 2x20 SLS with L toe touch PRN Sidelying shuttle press 50lbs 3x10   9/11  R TCJ post glide mob grade III-IV  HR off step 2s hold 2x10 SL balance on airex 20s 3x SL shuttle calf raise 75lbs 3x10 6" box  fwd heel tap/step down 10x each White leg press 65lbs 3x10 SL calf raise with toe touch 2x10 (discussed for home)     8/14   R TCJ post glide mob grade III-IV STM R fibularis group  Standing HR up 2 down1 3x10 4" box  fwd heel tap/step down 2x10 SL balance 30s 4x Gastroc stretch 30s 3x with foot prop  Mini squat 2x10 to low table height (feel stretch in calf at  bottom)  Shuttle leg press SL 75lbs 3x10  SL calf raise 31lbs 3x10   8/9     R TCJ post glide mob grade III-IV IASTM R achilles scar    Shuttle calf raise, staggered stance 50lbs 2x10 2s hold DL shuttle press 87lbs 3x10 SL balance 30s 4x Gastroc stretch 30s 3x Mini squat 2x10 to low table height (feel stretch in calf at bottom)  Step up  2x10 8"  PATIENT EDUCATION:  Education details: exercise progress, safety/surgical protection, muscle firing,  envelope of function, HEP, POC  Person educated: Patient Education method: Explanation, Demonstration, Tactile cues, Verbal cues, and Handouts Education comprehension: verbalized understanding, returned demonstration, verbal cues required, and tactile cues required   HOME EXERCISE PROGRAM: Access Code: [email or web address] URL: [email or web address] Date: 07/24/2021 Prepared by: Daleen Bo  ASSESSMENT:  CLINICAL IMPRESSION: Pt progressed to light DL plyometrics with added focus on SL stability type exercise. Pt with symmetrical CKC ankle DF ROM at this time and at least 10 deg of AROM DF of R ankle. Pt now able to perform SL calf raise with partial ROM. Exercise progressed to promote better R ankle stability. Pt cleared to golf and perform all gym based exercise with the exception of high impact type activity. Assess for response to new HEP at next and consider addition of skipping, shuffling at next in order to return to exercise and activity. Pt progressing well, continue to work on R ankle stability and strength for full return to acitivity. Pt would benefit from continued skilled therapy in order to reach goals and maximize functional R LE strength and ROM for full return to PLOF.    OBJECTIVE IMPAIRMENTS Abnormal gait, decreased activity tolerance, decreased balance, decreased endurance, decreased knowledge of use of DME, decreased mobility, difficulty walking, decreased ROM, decreased strength, hypomobility, increased edema,  increased fascial restrictions, impaired flexibility, impaired sensation, improper body mechanics, postural dysfunction, and pain.   ACTIVITY LIMITATIONS carrying, lifting, standing, squatting, stairs, transfers, bed mobility, bathing, toileting, dressing, locomotion level, and caring for others  PARTICIPATION LIMITATIONS: meal prep, cleaning, laundry, interpersonal relationship, driving, shopping, community activity, yard work, and exercise  PERSONAL FACTORS Age  affecting patient's functional outcome.   REHAB POTENTIAL: Good  CLINICAL DECISION MAKING: Stable/uncomplicated  EVALUATION COMPLEXITY: Low   GOALS:   SHORT TERM GOALS: Target date: 09/04/2021  Pt will become independent with initial HEP in order to demonstrate synthesis of PT education.   Goal status: MET  2.  Pt will be able to demonstrate FWB and normalized gait in order to demonstrate functional improvement in LE function for self-care and house hold duties.   Goal status: MET  3.  Pt will score at least 12 pt increase on FOTO to demonstrate functional improvement in MCII and pt perceived function.     Goal status: MET    LONG TERM GOALS: Target date: 01/22/2022   Pt  will become independent with final HEP in order to demonstrate synthesis of PT education.   Goal status: ongoing  2.  Pt will score >/= 65 on FOTO to demonstrate improvement in perceived R LE function.   Goal status: ongoing  3.  Pt will be able to demonstrate/report ability to walk >30 mins without pain in order to demonstrate functional improvement and tolerance to exercise and community mobility.   Goal status: met  4.  Pt will be able to demonstrate ability to perform 20 consecutive SL calf raise in order to demonstrate functional improvement in LE function for functional improvement in SL strength and stability for return to PLOF.  Goal status: ongoing   PLAN: PT FREQUENCY: Once every 2 wks  PT DURATION: 12 weeks  PLANNED  INTERVENTIONS: Therapeutic exercises, Therapeutic activity, Neuromuscular re-education, Balance training, Gait training, Patient/Family education, Joint manipulation, Joint mobilization, Stair training, Orthotic/Fit training, DME instructions, Aquatic Therapy, Dry Needling, Electrical stimulation, Spinal manipulation, Spinal mobilization, Cryotherapy, Moist heat, Compression bandaging, scar mobilization, Splintting, Taping, Vasopneumatic device, Traction, Ultrasound, Ionotophoresis  45m/ml Dexamethasone, Manual therapy, and Re-evaluation  PLAN FOR NEXT SESSION: progress strength and stability as able and tolerated; light plyometrics for HEP; FOTO at next  ADaleen BoPT, DPT 10/30/21 8:58 AM

## 2021-11-08 ENCOUNTER — Other Ambulatory Visit: Payer: Self-pay | Admitting: Family Medicine

## 2021-11-08 DIAGNOSIS — Z1231 Encounter for screening mammogram for malignant neoplasm of breast: Secondary | ICD-10-CM

## 2021-11-08 DIAGNOSIS — E2839 Other primary ovarian failure: Secondary | ICD-10-CM

## 2021-11-13 ENCOUNTER — Ambulatory Visit (HOSPITAL_BASED_OUTPATIENT_CLINIC_OR_DEPARTMENT_OTHER): Payer: Medicare Other | Admitting: Physical Therapy

## 2021-11-13 ENCOUNTER — Encounter (HOSPITAL_BASED_OUTPATIENT_CLINIC_OR_DEPARTMENT_OTHER): Payer: Self-pay | Admitting: Physical Therapy

## 2021-11-13 DIAGNOSIS — M25671 Stiffness of right ankle, not elsewhere classified: Secondary | ICD-10-CM | POA: Diagnosis not present

## 2021-11-13 DIAGNOSIS — M25571 Pain in right ankle and joints of right foot: Secondary | ICD-10-CM

## 2021-11-13 DIAGNOSIS — M6281 Muscle weakness (generalized): Secondary | ICD-10-CM

## 2021-11-13 DIAGNOSIS — R262 Difficulty in walking, not elsewhere classified: Secondary | ICD-10-CM

## 2021-11-13 NOTE — Therapy (Signed)
OUTPATIENT PHYSICAL TREATMENT NOTE   Patient Name: Kendra Norman MRN: [id number] DOB:1955/10/10, 66 y.o., female Today's Date: 11/13/2021   PT End of Session - 11/13/21 0806     Visit Number 16    Number of Visits 25    Date for PT Re-Evaluation 01/20/22    Authorization Type MCR    Progress Note Due on Visit 10    PT Start Time 0802    PT Stop Time 0841    PT Time Calculation (min) 39 min    Equipment Utilized During Treatment Gait belt    Activity Tolerance Patient tolerated treatment well    Behavior During Therapy WFL for tasks assessed/performed                      Past Medical History:  Diagnosis Date   GERD (gastroesophageal reflux disease)    Seasonal allergies    Past Surgical History:  Procedure Laterality Date   ABDOMINAL HYSTERECTOMY     BREAST BIOPSY     BREAST EXCISIONAL BIOPSY Bilateral    fibroadenomas - benign   BREAST LUMPECTOMY Bilateral    fibroadenomas - benign   VEIN SURGERY     Patient Active Problem List   Diagnosis Date Noted   Strain of muscle, fascia and tendon of lower back, initial encounter 10/16/2018    PCP: Aletha Halim., PA-C  REFERRING PROVIDER: Criselda Peaches, DPM  REFERRING DIAG: [phone] (ICD-10-CM) - Rupture of right Achilles tendon, initial encounter  THERAPY DIAG:  Stiffness of right ankle, not elsewhere classified  Muscle weakness (generalized)  Difficulty walking  Pain in right ankle and joints of right foot  Rationale for Evaluation and Treatment Rehabilitation  ONSET DATE: 06/07/2021 REPAIR ACHILLES TENDON, REMOVAL OF BONE SPUR, CALF MUSCLE LENGTHENING, TENDON TRANSFER RIGHT    SUBJECTIVE:   SUBJECTIVE STATEMENT:  Pt states that her pain is back down after taking a few days off to relax. She states that she feels she is walking near normal. She states that walking in the soft sand was painful but packed sand was fine.   Eval:  Pt states in back in the Fall, she had some what she  thought was plantar fasciitis. In January, she had injections and was put into the put. In April, she had PRP. In May she was putting on pants and felt that the "ankle went." Balance on the R leg. She had surgery 5/17. Pt plays golf. She also does water running. Husband at home helping.   Pt states she walks a lot. She even walks 30 min at a time with the knee scooter. Pain is well managed. No pain since day 3 post op. Pt denies signs of DVT.   PAIN:  Are you having pain? No: NPRS scale: 0/10 Pain location: R ankle incision Pain description: sharp, NT on top of    PRECAUTIONS: Other: Achilles  WEIGHT BEARING RESTRICTIONS Yes NWB  FALLS:  Has patient fallen in last 6 months? No  LIVING ENVIRONMENT: Lives with: lives with their spouse Lives in: House/apartment Stairs: Yes, steps to enter and 2nd floor home gym  Has following equipment at home: Gilford Rile - 2 wheeled, Crutches, and knee scooter  OCCUPATION: retired   PLOF: Independent, has [employer]   PATIENT GOALS : Pt states she would like to play golf and return exercising/walking/ bike/ pickleball   OBJECTIVE:   DIAGNOSTIC FINDINGS:   IMPRESSION: 1. Complete insertional rupture of the Achilles tendon with moderate  retraction. Underlying Achilles tendinosis with reactive edema in the calcaneal tuberosity. 2. Mild attenuation of the flexor digitorum longus tendon within the plantar aspect of the midfoot without evidence of focal tear. The additional ankle tendons appear normal. 3. 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 4. Mild tibiotalar and midfoot degenerative changes.  PATIENT SURVEYS:  FOTO 45 54 DC 12 pts MCII      FOTO 10th visit 8/16 59 pts   LOWER EXTREMITY ROM:  Active ROM Right eval R  7/10 7/17 8/14 8/16 10/9 Left eval  Ankle dorsiflexion -30 0 3 10 10  with OP 10 6  Ankle plantarflexion 50    50 55 55  Ankle inversion 12    27 WFL 30   Ankle eversion 8    9 WFL 20   (Blank rows = not tested)  MMT Right 8/16 L 10/9  Ankle dorsiflexion 4+/5 5/5  Ankle plantarflexion 2+/5 4/5  Ankle inversion 4+/5 4+/5  Ankle eversion 4/5 4+/5     TODAY'S TREATMENT  10/23   Recumbent bike L2.1 seat 11; 6 min  Standing calf stretch 30s 3x  Shuttle DL hops 2x15s 31lbs Shuttle SL hops 25lbs 10s 3x TRX DL hps 3x12 standing DL hops 2x15s Runner's step up 3x12 TRX SL squat 3x8 Skipping 28f 3x  10/9   Post ankle TCJ mob grade IV  Wobble board AP taps 2x20 Shuttle DL hops 2x15 DL woodpeckers 5s 10x SL RDL with roller 3x10 TRX SL squat 3x8 SL box squat 24 in box 3x8  9/26  (-) Thompson's (-) Step off deformity TTP at insertion site  STM R gastroc soleus Cross friction massage into Achilles insertion site- mild warmth noted but no erythema DL woodpeckers 5s 10x  Discussion of activity modification, rest/recovery period; taper and ramping back into exercise  9/18  Self ankle DF mob green  Toe walking 3x laps 328f Resisted cable walkouts, retro and fwd 22.5 lbs 2x5 TRX Deep squat holds 3s 15x Rebounder 2lb ball 2x20 SLS with L toe touch PRN; fwd and lateral Cable chops 10x eachh 22.5 lbs   Discussion of pool based plyometrics, return to swimming and golf   PATIENT EDUCATION:  Education details: exercise progress, safety/surgical protection, muscle firing,  envelope of function, HEP, POC  Person educated: Patient Education method: Explanation, Demonstration, Tactile cues, Verbal cues, and Handouts Education comprehension: verbalized understanding, returned demonstration, verbal cues required, and tactile cues required   HOME EXERCISE PROGRAM: Access Code: [email or web address] URL: [email or web address] Date: 07/24/2021 Prepared by: AlDaleen BoASSESSMENT:  CLINICAL IMPRESSION: Pt with full ROM today and presents with no pain at beginning of session. Pt able to continue with intro to FWB  plyometrics today without pain. Pt does continue to have SL ankle PF weakness on R as expected with intro to FWB reactive strength. Pt advised to continue working on SL calf strength at this time as well as modification of HEP in order to reduce irritation of the Achilles. Pt HEP updated. Pt progressing well, continue to work on R ankle stability and strength for full return to acitivity. Pt likely to D/C at next session to independent management. Pt cleared to perform all recreational activity with the caveat of slow graded progression in order to reduce risk of injury. Pt would benefit from continued skilled therapy in order to reach goals and maximize functional R LE strength and ROM for full return to PLOF.    OBJECTIVE IMPAIRMENTS Abnormal gait, decreased  activity tolerance, decreased balance, decreased endurance, decreased knowledge of use of DME, decreased mobility, difficulty walking, decreased ROM, decreased strength, hypomobility, increased edema, increased fascial restrictions, impaired flexibility, impaired sensation, improper body mechanics, postural dysfunction, and pain.   ACTIVITY LIMITATIONS carrying, lifting, standing, squatting, stairs, transfers, bed mobility, bathing, toileting, dressing, locomotion level, and caring for others  PARTICIPATION LIMITATIONS: meal prep, cleaning, laundry, interpersonal relationship, driving, shopping, community activity, yard work, and exercise  PERSONAL FACTORS Age  affecting patient's functional outcome.   REHAB POTENTIAL: Good  CLINICAL DECISION MAKING: Stable/uncomplicated  EVALUATION COMPLEXITY: Low   GOALS:   SHORT TERM GOALS: Target date: 09/04/2021  Pt will become independent with initial HEP in order to demonstrate synthesis of PT education.   Goal status: MET  2.  Pt will be able to demonstrate FWB and normalized gait in order to demonstrate functional improvement in LE function for self-care and house hold duties.   Goal status:  MET  3.  Pt will score at least 12 pt increase on FOTO to demonstrate functional improvement in MCII and pt perceived function.     Goal status: MET    LONG TERM GOALS: Target date: 02/05/2022   Pt  will become independent with final HEP in order to demonstrate synthesis of PT education.   Goal status: ongoing  2.  Pt will score >/= 65 on FOTO to demonstrate improvement in perceived R LE function.   Goal status: ongoing  3.  Pt will be able to demonstrate/report ability to walk >30 mins without pain in order to demonstrate functional improvement and tolerance to exercise and community mobility.   Goal status: met  4.  Pt will be able to demonstrate ability to perform 20 consecutive SL calf raise in order to demonstrate functional improvement in LE function for functional improvement in SL strength and stability for return to PLOF.  Goal status: ongoing   PLAN: PT FREQUENCY: Once every 2 wks  PT DURATION: 12 weeks  PLANNED INTERVENTIONS: Therapeutic exercises, Therapeutic activity, Neuromuscular re-education, Balance training, Gait training, Patient/Family education, Joint manipulation, Joint mobilization, Stair training, Orthotic/Fit training, DME instructions, Aquatic Therapy, Dry Needling, Electrical stimulation, Spinal manipulation, Spinal mobilization, Cryotherapy, Moist heat, Compression bandaging, scar mobilization, Splintting, Taping, Vasopneumatic device, Traction, Ultrasound, Ionotophoresis 54m/ml Dexamethasone, Manual therapy, and Re-evaluation  PLAN FOR NEXT SESSION: progress strength and stability as able and tolerated; light plyometrics for HEP  ADaleen BoPT, DPT 11/13/21 8:58 AM

## 2021-11-16 ENCOUNTER — Other Ambulatory Visit (HOSPITAL_BASED_OUTPATIENT_CLINIC_OR_DEPARTMENT_OTHER): Payer: Self-pay

## 2021-11-16 ENCOUNTER — Other Ambulatory Visit (HOSPITAL_COMMUNITY): Payer: Self-pay

## 2021-11-21 ENCOUNTER — Ambulatory Visit (INDEPENDENT_AMBULATORY_CARE_PROVIDER_SITE_OTHER): Payer: Medicare Other | Admitting: Podiatry

## 2021-11-21 DIAGNOSIS — M62461 Contracture of muscle, right lower leg: Secondary | ICD-10-CM

## 2021-11-21 DIAGNOSIS — M7661 Achilles tendinitis, right leg: Secondary | ICD-10-CM | POA: Diagnosis not present

## 2021-11-21 DIAGNOSIS — M9261 Juvenile osteochondrosis of tarsus, right ankle: Secondary | ICD-10-CM | POA: Diagnosis not present

## 2021-11-21 DIAGNOSIS — S86011A Strain of right Achilles tendon, initial encounter: Secondary | ICD-10-CM | POA: Diagnosis not present

## 2021-11-21 NOTE — Progress Notes (Signed)
  Subjective:  Patient ID: Kendra Norman, female    DOB: Jun 23, 1955,  MRN: [id number]  Chief Complaint  Patient presents with   Tendonitis     DOS 06/07/2021 REPARI ACHILLES TENDON, REMOVAL OF BONE SPUR, CALF MUSCLE LENGTHENING, TENDON TRANSFER RIGHT     66 y.o. female returns for post-op check.  She is doing very well she is ambulating in regular shoe gear she has been able to do a double heel rise still cannot do a single heel rise on the right side, she has 1 or 2 sessions of therapy left  Review of Systems: Negative except as noted in the HPI. Denies N/V/F/Ch.   Objective:  There were no vitals filed for this visit. There is no height or weight on file to calculate BMI. Constitutional Well developed. Well nourished.  Vascular Foot warm and well perfused. Capillary refill normal to all digits.  Calf is soft and supple, no posterior calf or knee pain, negative Homans' sign  Neurologic Normal speech. Oriented to person, place, and time. Epicritic sensation to light touch grossly present bilaterally.  Dermatologic Incision is well-healed it is nonhypertrophic nontender  Orthopedic: Minimal edema.  No tenderness to palpation at the surgical site.  She has good 4+ out of 5 strength in plantarflexion 5 out of 5 in dorsiflexion.  Does have some weakness compared to contralateral limb.   Multiple view plain film radiographs: Interval resection of posterior calcaneal enthesophyte repair of Achilles rupture and Haglund deformity, ghost tracks for anchors and tendon transfer noted Assessment:   1. Rupture of right Achilles tendon, initial encounter   2. Gastrocnemius equinus of right lower extremity   3. Haglund's deformity, right   4. Achilles tendinitis, right leg    Plan:  Patient was evaluated and treated and all questions answered.  S/p foot surgery right -Overall continues to do well and she is back to most of her activities she was able to play golf recently, she no longer needs  to wear the compression stocking.  She may continue any activity as tolerated in regular shoe gear.  I do think it will take quite some time still part another 6 months to get her full conditioning and strength back to her baseline.  We discussed that it is possible she may have some residual weakness long-term compared to her contralateral limb but overall so far very pleased with her results.  She will return to see me on a as needed basis she may discontinue therapy when she is ready.  Return if symptoms worsen or fail to improve.

## 2021-11-27 ENCOUNTER — Encounter (HOSPITAL_BASED_OUTPATIENT_CLINIC_OR_DEPARTMENT_OTHER): Payer: Medicare Other | Admitting: Physical Therapy

## 2021-12-07 ENCOUNTER — Other Ambulatory Visit (HOSPITAL_COMMUNITY): Payer: Self-pay

## 2021-12-07 ENCOUNTER — Other Ambulatory Visit (HOSPITAL_BASED_OUTPATIENT_CLINIC_OR_DEPARTMENT_OTHER): Payer: Self-pay

## 2021-12-07 ENCOUNTER — Ambulatory Visit (HOSPITAL_BASED_OUTPATIENT_CLINIC_OR_DEPARTMENT_OTHER): Payer: Medicare Other | Attending: Podiatry | Admitting: Physical Therapy

## 2021-12-07 ENCOUNTER — Encounter (HOSPITAL_BASED_OUTPATIENT_CLINIC_OR_DEPARTMENT_OTHER): Payer: Self-pay | Admitting: Physical Therapy

## 2021-12-07 DIAGNOSIS — M25571 Pain in right ankle and joints of right foot: Secondary | ICD-10-CM | POA: Insufficient documentation

## 2021-12-07 DIAGNOSIS — R262 Difficulty in walking, not elsewhere classified: Secondary | ICD-10-CM | POA: Insufficient documentation

## 2021-12-07 DIAGNOSIS — M25671 Stiffness of right ankle, not elsewhere classified: Secondary | ICD-10-CM | POA: Diagnosis not present

## 2021-12-07 DIAGNOSIS — M6281 Muscle weakness (generalized): Secondary | ICD-10-CM | POA: Diagnosis present

## 2021-12-07 MED ORDER — AREXVY 120 MCG/0.5ML IM SUSR
INTRAMUSCULAR | 0 refills | Status: DC
  Filled 2021-12-07 (×2): qty 0.5, 1d supply, fill #0

## 2021-12-07 NOTE — Therapy (Signed)
OUTPATIENT PHYSICAL TREATMENT NOTE   Patient Name: Kendra Norman MRN: [id number] DOB:Jan 03, 1956, 66 y.o., female Today's Date: 12/07/2021   PT End of Session - 12/07/21 1150     Visit Number 17    Number of Visits 25    Date for PT Re-Evaluation 01/20/22    Authorization Type MCR    Progress Note Due on Visit 10    PT Start Time 4696    PT Stop Time 1209    PT Time Calculation (min) 24 min    Equipment Utilized During Treatment Gait belt    Activity Tolerance Patient tolerated treatment well    Behavior During Therapy WFL for tasks assessed/performed                       Past Medical History:  Diagnosis Date   GERD (gastroesophageal reflux disease)    Seasonal allergies    Past Surgical History:  Procedure Laterality Date   ABDOMINAL HYSTERECTOMY     BREAST BIOPSY     BREAST EXCISIONAL BIOPSY Bilateral    fibroadenomas - benign   BREAST LUMPECTOMY Bilateral    fibroadenomas - benign   VEIN SURGERY     Patient Active Problem List   Diagnosis Date Noted   Strain of muscle, fascia and tendon of lower back, initial encounter 10/16/2018    PCP: Aletha Halim., PA-C  REFERRING PROVIDER: Criselda Peaches, DPM  REFERRING DIAG: [phone] (ICD-10-CM) - Rupture of right Achilles tendon, initial encounter  THERAPY DIAG:  Stiffness of right ankle, not elsewhere classified  Muscle weakness (generalized)  Difficulty walking  Pain in right ankle and joints of right foot  Rationale for Evaluation and Treatment Rehabilitation  ONSET DATE: 06/07/2021 REPAIR ACHILLES TENDON, REMOVAL OF BONE SPUR, CALF MUSCLE LENGTHENING, TENDON TRANSFER RIGHT    SUBJECTIVE:   SUBJECTIVE STATEMENT:  Pt states the only thing she cannot do a single leg calf raise just yet. Pt has returned to golf at this time. Pt has no other moments of pain or discomfort.   Eval:  Pt states in back in the Fall, she had some what she thought was plantar fasciitis. In January, she had  injections and was put into the put. In April, she had PRP. In May she was putting on pants and felt that the "ankle went." Balance on the R leg. She had surgery 5/17. Pt plays golf. She also does water running. Husband at home helping.   Pt states she walks a lot. She even walks 30 min at a time with the knee scooter. Pain is well managed. No pain since day 3 post op. Pt denies signs of DVT.   PAIN:  Are you having pain? No: NPRS scale: 0/10 Pain location: R ankle incision Pain description: sharp, NT on top of    PRECAUTIONS: Other: Achilles  WEIGHT BEARING RESTRICTIONS Yes NWB  FALLS:  Has patient fallen in last 6 months? No  LIVING ENVIRONMENT: Lives with: lives with their spouse Lives in: House/apartment Stairs: Yes, steps to enter and 2nd floor home gym  Has following equipment at home: Gilford Rile - 2 wheeled, Crutches, and knee scooter  OCCUPATION: retired   PLOF: Independent, has [employer] center   PATIENT GOALS : Pt states she would like to play golf and return exercising/walking/ bike/ pickleball   OBJECTIVE:   DIAGNOSTIC FINDINGS:   IMPRESSION: 1. Complete insertional rupture of the Achilles tendon with moderate retraction. Underlying Achilles tendinosis with reactive edema in  the calcaneal tuberosity. 2. Mild attenuation of the flexor digitorum longus tendon within the plantar aspect of the midfoot without evidence of focal tear. The additional ankle tendons appear normal. 3. 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 4. Mild tibiotalar and midfoot degenerative changes.  PATIENT SURVEYS:  FOTO 45 65 DC 12 pts MCII      FOTO 10th visit 8/16 59 pts  FOTO 17th 77%     LOWER EXTREMITY ROM:  Active ROM Right eval R  7/10 7/17 8/14 8/16 10/9 11/16 Left eval  Ankle dorsiflexion -30 0 _0 with OP _1 Ankle plantarflexion 50    50 55 60 55  Ankle inversion 12    27 WFL 30 30  Ankle eversion 8     9 WFL 15 20   (Blank rows = not tested)  MMT Right 8/16 R 10/9 R 11/16   Ankle dorsiflexion 4+/5 5/5 5/5  Ankle plantarflexion 2+/5 4/5 4+/5  Ankle inversion 4+/5 4+/5 5/5  Ankle eversion 4/5 4+/5 5/5     TODAY'S TREATMENT.  11/16  Access Code: [email or web address] URL: [email or web address] Date: 12/07/2021 Prepared by: Daleen Bo  Exercises - Toe Walking  - 1 x daily - 2 x weekly - 3 sets - 1 reps - 15 ft hold - Single Leg Heel Raise on Step  - 1 x daily - 2 x weekly - 4 sets - 8 reps - Squat Jumps  - 1 x daily - 2 x weekly - 3 sets - 12 reps - Single Leg Balance on Foam  - 1 x daily - 2 x weekly - 2 sets - 3 reps - 30 hold - Single Leg Squat with Chair Touch  - 1 x daily - 2 x weekly - 3 sets - 8 reps  Review of HEP and edu about progression for sports  10/23   Recumbent bike L2.1 seat 11; 6 min  Standing calf stretch 30s 3x  Shuttle DL hops 2x15s 31lbs Shuttle SL hops 25lbs 10s 3x TRX DL hps 3x12 standing DL hops 2x15s Runner's step up 3x12 TRX SL squat 3x8 Skipping 74f 3x  10/9   Post ankle TCJ mob grade IV  Wobble board AP taps 2x20 Shuttle DL hops 2x15 DL woodpeckers 5s 10x SL RDL with roller 3x10 TRX SL squat 3x8 SL box squat 24 in box 3x8  9/26  (-) Thompson's (-) Step off deformity TTP at insertion site  STM R gastroc soleus Cross friction massage into Achilles insertion site- mild warmth noted but no erythema DL woodpeckers 5s 10x  Discussion of activity modification, rest/recovery period; taper and ramping back into exercise  9/18  Self ankle DF mob green  Toe walking 3x laps 364f Resisted cable walkouts, retro and fwd 22.5 lbs 2x5 TRX Deep squat holds 3s 15x Rebounder 2lb ball 2x20 SLS with L toe touch PRN; fwd and lateral Cable chops 10x eachh 22.5 lbs   Discussion of pool based plyometrics, return to swimming and golf   PATIENT EDUCATION:  Education details: exam findings, exercise progression, muscle firing,   envelope of function, HEP, POC  Person educated: Patient Education method: Explanation, Demonstration, Tactile cues, Verbal cues, and Handouts Education comprehension: verbalized understanding, returned demonstration, verbal cues required, and tactile cues required   HOME EXERCISE PROGRAM: Access Code: [email or web address] URL: [email or web address] Date: 07/24/2021 Prepared by: AlDaleen BoASSESSMENT:  CLINICAL IMPRESSION: Patient has met all functional goals at this time.  Patient  with symmetrical range of motion and to bilateral ankles.  patient has improved strength into plantarflexion especially with body weight loading.  Patient has exceeded Foto outcome score measured at this time.  Patient has return to all recreational activity at this time without concerns.  Patient given education about progression of exercise as well as slow graded progression back into plyometric activity.  All questions addressed and all patient concerns addressed at today's visit.  Patient with no further need for skilled therapy.  Discharge this current episode of care.    OBJECTIVE IMPAIRMENTS Abnormal gait, decreased activity tolerance, decreased balance, decreased endurance, decreased knowledge of use of DME, decreased mobility, difficulty walking, decreased ROM, decreased strength, hypomobility, increased edema, increased fascial restrictions, impaired flexibility, impaired sensation, improper body mechanics, postural dysfunction, and pain.   ACTIVITY LIMITATIONS carrying, lifting, standing, squatting, stairs, transfers, bed mobility, bathing, toileting, dressing, locomotion level, and caring for others  PARTICIPATION LIMITATIONS: meal prep, cleaning, laundry, interpersonal relationship, driving, shopping, community activity, yard work, and exercise  PERSONAL FACTORS Age  affecting patient's functional outcome.   REHAB POTENTIAL: Good  CLINICAL DECISION MAKING: Stable/uncomplicated  EVALUATION  COMPLEXITY: Low   GOALS:   SHORT TERM GOALS: Target date: 09/04/2021  Pt will become independent with initial HEP in order to demonstrate synthesis of PT education.   Goal status: MET  2.  Pt will be able to demonstrate FWB and normalized gait in order to demonstrate functional improvement in LE function for self-care and house hold duties.   Goal status: MET  3.  Pt will score at least 12 pt increase on FOTO to demonstrate functional improvement in MCII and pt perceived function.     Goal status: MET    LONG TERM GOALS: Target date: 03/01/2022   Pt  will become independent with final HEP in order to demonstrate synthesis of PT education.   Goal status: met  2.  Pt will score >/= 65 on FOTO to demonstrate improvement in perceived R LE function.   Goal status: met  3.  Pt will be able to demonstrate/report ability to walk >30 mins without pain in order to demonstrate functional improvement and tolerance to exercise and community mobility.   Goal status: met  4.  Pt will be able to demonstrate ability to perform 20 consecutive SL calf raise in order to demonstrate functional improvement in LE function for functional improvement in SL strength and stability for return to PLOF.  Goal status: met with UE support   PLAN: PT FREQUENCY: Once every 2 wks  PT DURATION: 12 weeks  PLANNED INTERVENTIONS: Therapeutic exercises, Therapeutic activity, Neuromuscular re-education, Balance training, Gait training, Patient/Family education, Joint manipulation, Joint mobilization, Stair training, Orthotic/Fit training, DME instructions, Aquatic Therapy, Dry Needling, Electrical stimulation, Spinal manipulation, Spinal mobilization, Cryotherapy, Moist heat, Compression bandaging, scar mobilization, Splintting, Taping, Vasopneumatic device, Traction, Ultrasound, Ionotophoresis 53m/ml Dexamethasone, Manual therapy, and Re-evaluation  PLAN FOR NEXT SESSION: progress strength and stability as able  and tolerated; light plyometrics for HEP  ADaleen BoPT, DPT 12/07/21 12:14 PM

## 2021-12-11 ENCOUNTER — Ambulatory Visit (HOSPITAL_BASED_OUTPATIENT_CLINIC_OR_DEPARTMENT_OTHER): Payer: Medicare Other | Admitting: Physical Therapy

## 2021-12-19 ENCOUNTER — Other Ambulatory Visit: Payer: Self-pay

## 2022-01-05 ENCOUNTER — Encounter (HOSPITAL_BASED_OUTPATIENT_CLINIC_OR_DEPARTMENT_OTHER): Payer: Self-pay | Admitting: Physical Therapy

## 2022-01-26 ENCOUNTER — Other Ambulatory Visit (HOSPITAL_BASED_OUTPATIENT_CLINIC_OR_DEPARTMENT_OTHER): Payer: Self-pay

## 2022-01-26 ENCOUNTER — Other Ambulatory Visit (HOSPITAL_COMMUNITY): Payer: Self-pay

## 2022-01-26 MED ORDER — SHINGRIX 50 MCG/0.5ML IM SUSR
INTRAMUSCULAR | 0 refills | Status: DC
  Filled 2022-01-26 (×3): qty 0.5, 1d supply, fill #0

## 2022-04-23 ENCOUNTER — Ambulatory Visit
Admission: RE | Admit: 2022-04-23 | Discharge: 2022-04-23 | Disposition: A | Discharge status: Home / Self Care | Payer: Medicare Other | Source: Ambulatory Visit | Attending: Family Medicine | Admitting: Family Medicine

## 2022-04-23 DIAGNOSIS — E2839 Other primary ovarian failure: Secondary | ICD-10-CM

## 2022-04-23 DIAGNOSIS — Z1231 Encounter for screening mammogram for malignant neoplasm of breast: Secondary | ICD-10-CM

## 2022-10-17 ENCOUNTER — Other Ambulatory Visit (HOSPITAL_BASED_OUTPATIENT_CLINIC_OR_DEPARTMENT_OTHER): Payer: Self-pay

## 2022-10-17 MED ORDER — FLUAD 0.5 ML IM SUSY
0.5000 mL | PREFILLED_SYRINGE | Freq: Once | INTRAMUSCULAR | 0 refills | Status: AC
  Filled 2022-10-17: qty 0.5, 1d supply, fill #0

## 2023-03-25 ENCOUNTER — Other Ambulatory Visit: Payer: Self-pay | Admitting: Family Medicine

## 2023-03-25 DIAGNOSIS — Z1231 Encounter for screening mammogram for malignant neoplasm of breast: Secondary | ICD-10-CM

## 2023-04-24 ENCOUNTER — Ambulatory Visit

## 2023-04-26 ENCOUNTER — Ambulatory Visit
Admission: RE | Admit: 2023-04-26 | Discharge: 2023-04-26 | Disposition: A | Discharge status: Home / Self Care | Source: Ambulatory Visit | Attending: Family Medicine | Admitting: Family Medicine

## 2023-04-26 DIAGNOSIS — Z1231 Encounter for screening mammogram for malignant neoplasm of breast: Secondary | ICD-10-CM

## 2023-05-28 ENCOUNTER — Ambulatory Visit (INDEPENDENT_AMBULATORY_CARE_PROVIDER_SITE_OTHER): Admitting: Podiatry

## 2023-05-28 ENCOUNTER — Ambulatory Visit (INDEPENDENT_AMBULATORY_CARE_PROVIDER_SITE_OTHER)

## 2023-05-28 ENCOUNTER — Encounter: Payer: Self-pay | Admitting: Podiatry

## 2023-05-28 VITALS — Ht 70.0 in | Wt 217.0 lb

## 2023-05-28 DIAGNOSIS — M6788 Other specified disorders of synovium and tendon, other site: Secondary | ICD-10-CM | POA: Diagnosis not present

## 2023-05-28 DIAGNOSIS — M7732 Calcaneal spur, left foot: Secondary | ICD-10-CM | POA: Diagnosis not present

## 2023-05-28 DIAGNOSIS — M9262 Juvenile osteochondrosis of tarsus, left ankle: Secondary | ICD-10-CM

## 2023-05-28 DIAGNOSIS — M62462 Contracture of muscle, left lower leg: Secondary | ICD-10-CM | POA: Diagnosis not present

## 2023-05-28 DIAGNOSIS — M778 Other enthesopathies, not elsewhere classified: Secondary | ICD-10-CM

## 2023-05-28 MED ORDER — PREDNISONE 10 MG PO TABS: ORAL_TABLET | ORAL | 0 refills | Status: AC

## 2023-05-28 NOTE — Progress Notes (Signed)
  Subjective:  Patient ID: Kendra Norman, female    DOB: 01-01-1956,  MRN: [id number]  Chief Complaint  Patient presents with   Foot Pain    Patient is here for left heel pain for 6 weeks let it rest felt little better but now hurting again     68 y.o. female returns for post-op check.  She returns for follow-up, her right side is doing quite well she is now experiencing pain in her left Achilles and heel that has been worsening over the last 6 weeks she has R wrist and had hoped it would resolve after this but has returned again.  Review of Systems: Negative except as noted in the HPI. Denies N/V/F/Ch.   Objective:  There were no vitals filed for this visit. Body mass index is 31.14 kg/m. Constitutional Well developed. Well nourished.  Vascular Foot warm and well perfused. Capillary refill normal to all digits.  Calf is soft and supple, no posterior calf or knee pain, negative Homans' sign  Neurologic Normal speech. Oriented to person, place, and time. Epicritic sensation to light touch grossly present bilaterally.  Dermatologic No notable lesions  Orthopedic: No edema, she does have tenderness to the posterior heel at the insertion of the Achilles there is a palpable heel spur gastrocnemius equinus is present.  No discontinuity of the tendon   Multiple view plain film radiographs: New films taken today show large posterior calcaneal enthesophyte plantar enthesophyte and deformity Assessment:   1. Achilles tendinosis of left lower extremity   2. Gastrocnemius equinus of left lower extremity   3. Heel spur, left   4. Haglund's deformity, left    Plan:  Patient was evaluated and treated and all questions answered.  S/p foot surgery right - We reviewed her x-rays and progress, she has an upcoming trip to [place]  and would like to be conservative in her current approach.  I recommend we start with offloading with Tuli's heel cup which she has from her prior surgery as well as a  heel lift which was dispensed and she will utilize this in her shoe, if ineffective then she will utilize her short cam boot.  This will have a soft tissues to rest and immobilize the tendon for soft tissue healing, I also recommended using Voltaren gel 3-4 times daily she is taking naproxen  5 mg twice a day, I also placed her on prednisone  steroid taper prior to her trip.  Home therapy plan given.  Follow-up in 6 weeks to reevaluate.  If worsening or no improvement by that point would recommend MRI and evaluation, discussed that surgical options may be advisable for this foot as well prior to worsening and risk of rupture, there may be more minimally invasive options such as gastrocnemius recession PRP injection and tendon debridement that we could consider prior to this.  Return in about 6 weeks (around 07/09/2023) for re-check Achilles tendon.

## 2023-05-28 NOTE — Patient Instructions (Signed)

## 2023-06-03 ENCOUNTER — Other Ambulatory Visit: Payer: Self-pay | Admitting: Family Medicine

## 2023-06-03 DIAGNOSIS — E7800 Pure hypercholesterolemia, unspecified: Secondary | ICD-10-CM

## 2023-06-03 DIAGNOSIS — Z9189 Other specified personal risk factors, not elsewhere classified: Secondary | ICD-10-CM

## 2023-06-03 DIAGNOSIS — E781 Pure hyperglyceridemia: Secondary | ICD-10-CM

## 2023-06-03 DIAGNOSIS — R9430 Abnormal result of cardiovascular function study, unspecified: Secondary | ICD-10-CM

## 2023-06-25 ENCOUNTER — Encounter: Payer: Self-pay | Admitting: Podiatry

## 2023-06-27 ENCOUNTER — Ambulatory Visit
Admission: RE | Admit: 2023-06-27 | Discharge: 2023-06-27 | Disposition: A | Discharge status: Home / Self Care | Source: Ambulatory Visit | Attending: Family Medicine | Admitting: Family Medicine

## 2023-06-27 DIAGNOSIS — E781 Pure hyperglyceridemia: Secondary | ICD-10-CM

## 2023-06-27 DIAGNOSIS — R9430 Abnormal result of cardiovascular function study, unspecified: Secondary | ICD-10-CM

## 2023-06-27 DIAGNOSIS — E7800 Pure hypercholesterolemia, unspecified: Secondary | ICD-10-CM

## 2023-06-27 DIAGNOSIS — Z9189 Other specified personal risk factors, not elsewhere classified: Secondary | ICD-10-CM

## 2023-07-08 IMAGING — MR MR HEEL *R* W/O CM
4 of 5 series · 12 of 40 positions shown · non-contrast
Comparison: Radiographs 04/22/2021 and 03/01/2021.

CLINICAL DATA: Chronic Achilles tendinitis with prior injections.
Sudden onset of acute posterior heel pain with limited
weight-bearing. Concern for Achilles tendon rupture.

EXAM:
MR OF THE RIGHT HEEL WITHOUT CONTRAST
TECHNIQUE: Multiplanar, multisequence MR imaging of the right hindfoot was
performed. No intravenous contrast was administered.

[Series 3: T2 fat-sat · axial · right · 3.0mm · 0.25mm/px · z∈[-70,+37]mm · 3 of 35 slices shown (1 of 2)]
[im 4/35]
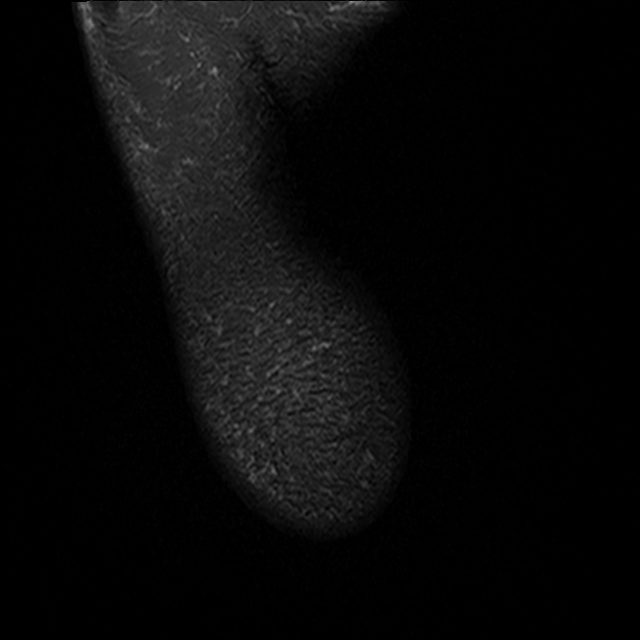
[im 19/35]
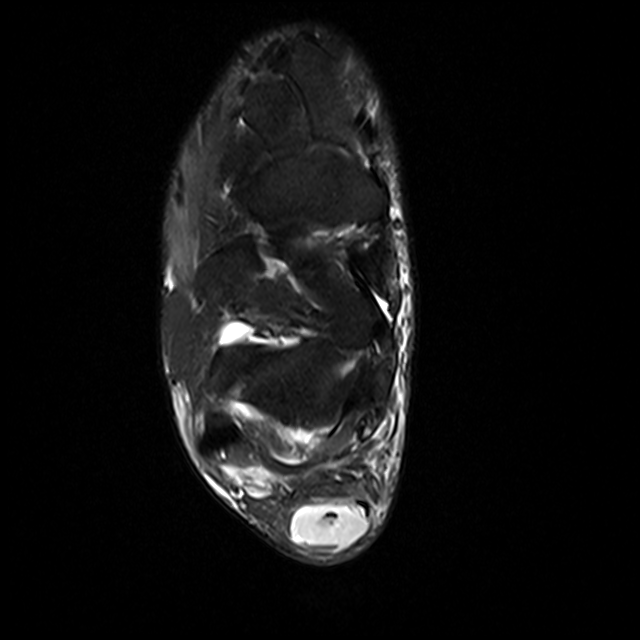
[im 31/35]
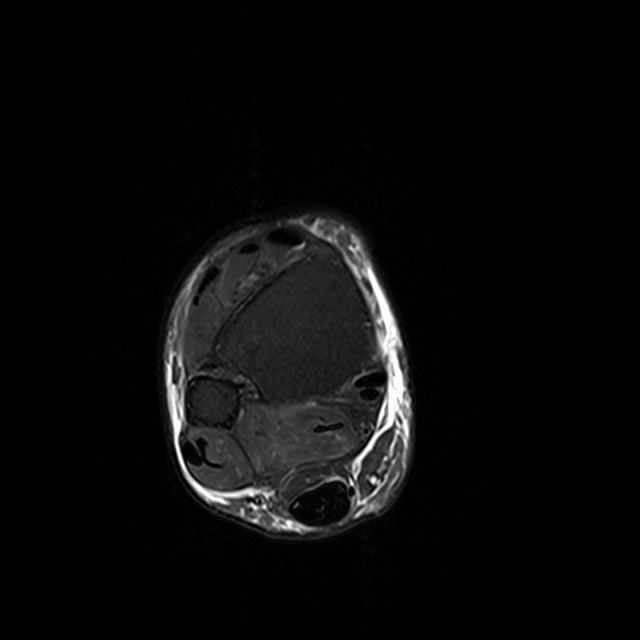

[Series 4: T1 · axial · right · 3.0mm · 0.25mm/px · z∈[-66,+33]mm · 3 of 35 slices shown (1 of 2)]
[im 5/35]
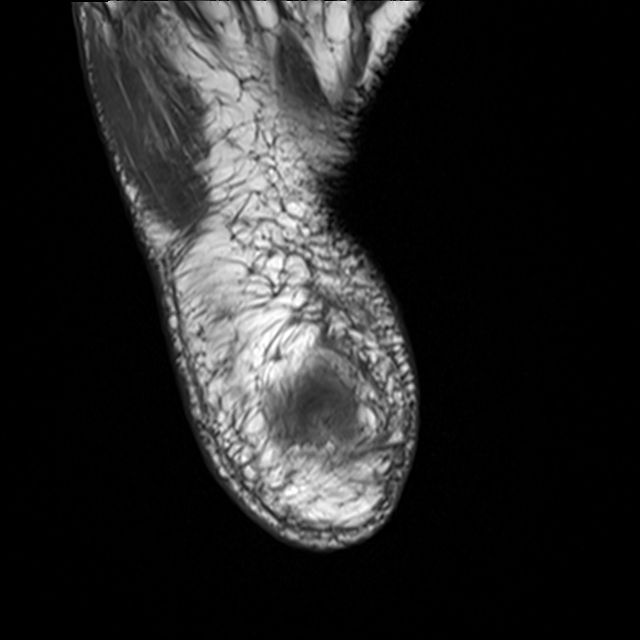
[im 18/35]
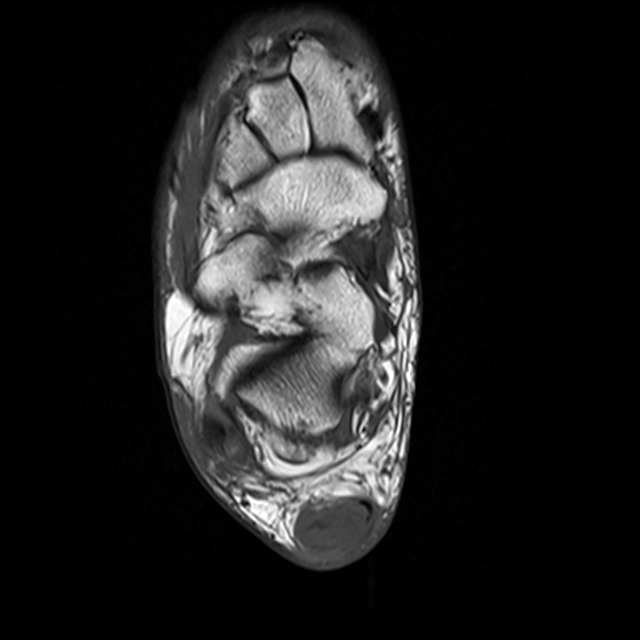
[im 30/35]
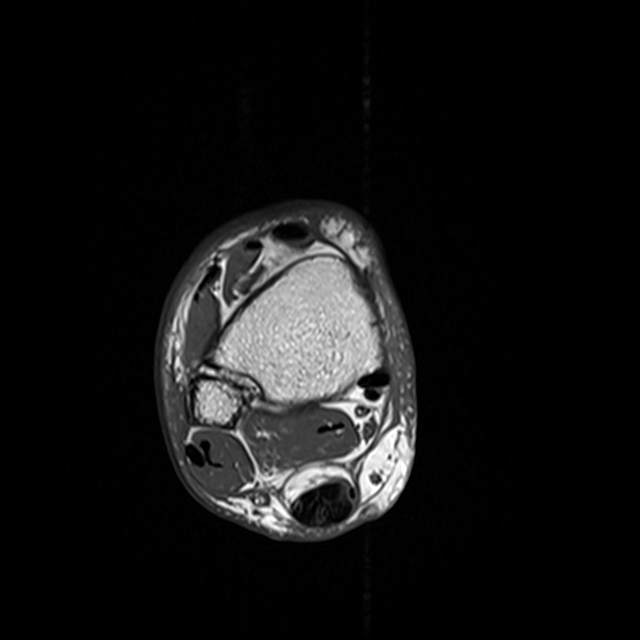

[Series 5: T1 · sagittal · right · 4.0mm · 0.27mm/px · 3 of 24 slices shown (2 of 2)]
[im 5/24]
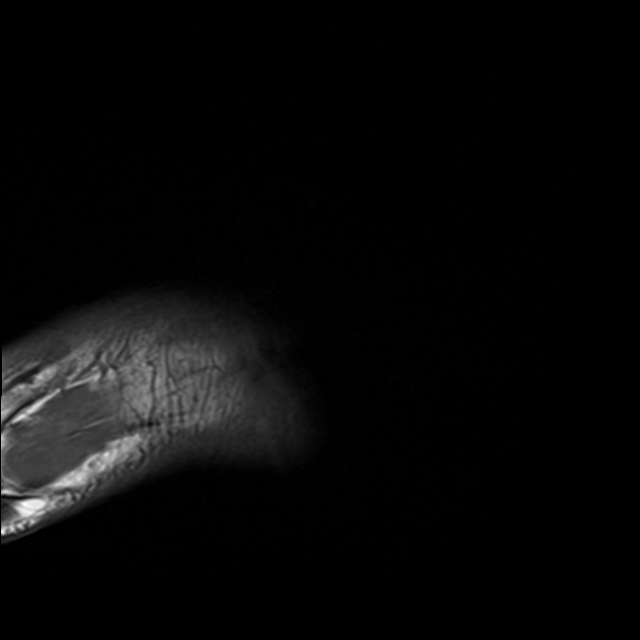
[im 14/24]
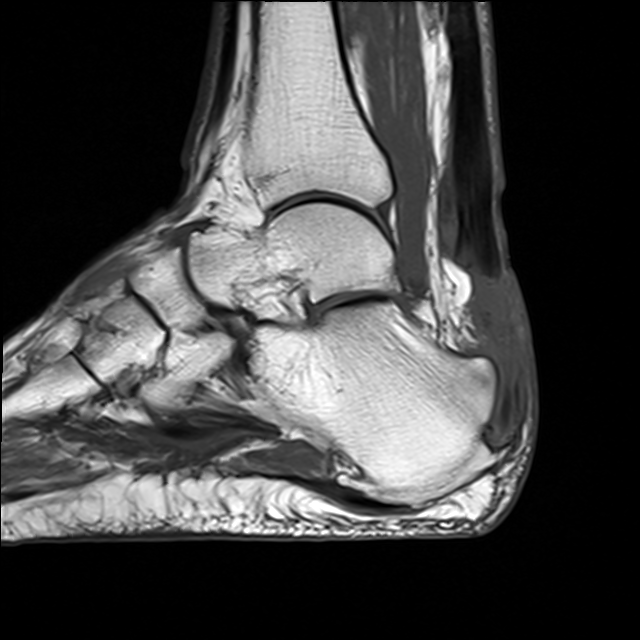
[im 24/24]
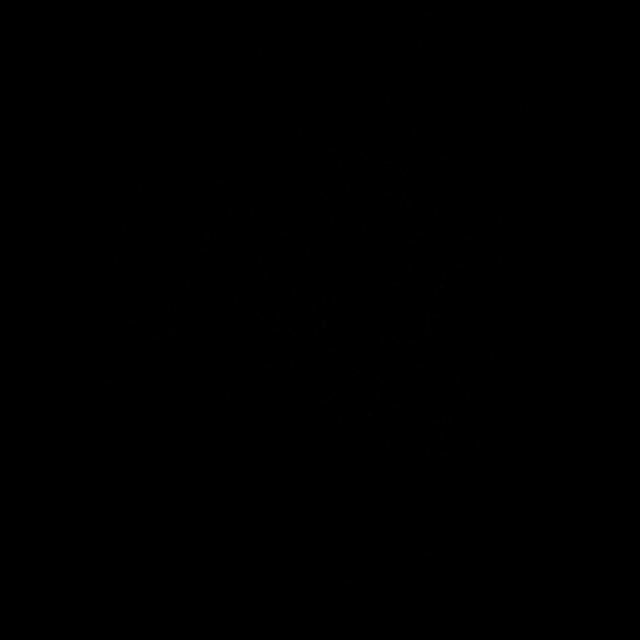

[Series 7: T2 fat-sat · coronal · right · 3.0mm · 0.25mm/px · 3 of 33 slices shown (2 of 2)]
[im 5/33]
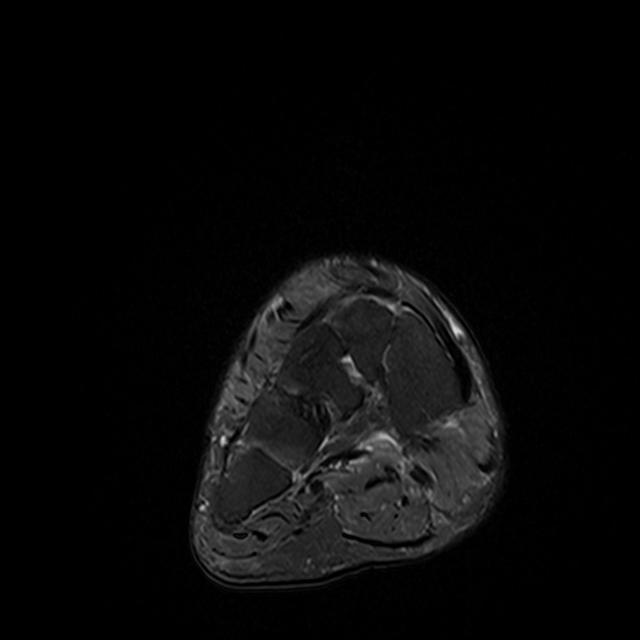
[im 17/33]
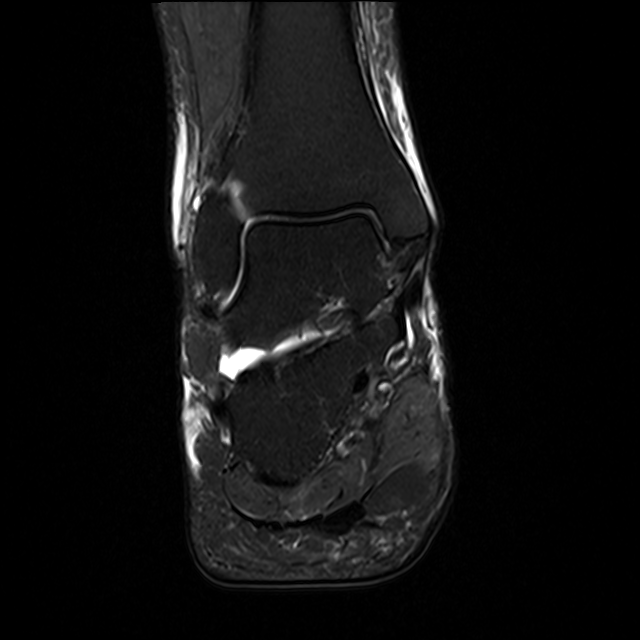
[im 29/33]
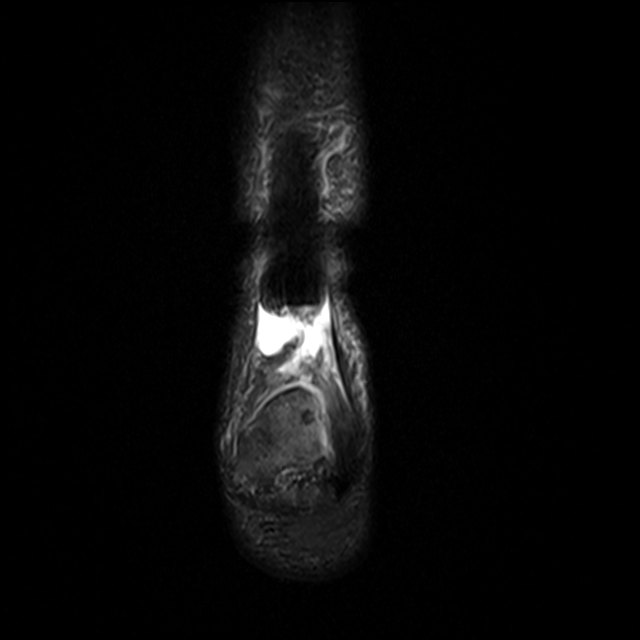

[12 of 40 positions shown; findings below may reference images not displayed]

FINDINGS: TENDONS

Peroneal: Intact and normally positioned.

Posteromedial: The tibialis anterior and flexor hallucis longus
tendons are intact. The flexor digitorum longus tendon is mildly
attenuated inferior to the calcaneus, but intact. No significant
tenosynovitis.

Anterior: Intact and normally positioned.

Achilles: The Achilles tendon is completely ruptured distally with
2.2 cm of superior retraction measured from the top of the calcaneal
tuberosity. Underlying diffuse Achilles tendinosis. There is
reactive edema superiorly in the calcaneal tuberosity, but no
evidence of avulsion fracture. Prominent posterior and plantar
calcaneal spurs are present.

Plantar Fascia: Intact.

LIGAMENTS

Lateral: Ligamentous thickening and adjacent spurring along the
anterior talofibular ligament, likely related to remote injury. The
posterior talofibular and calcaneofibular ligaments are intact.The
inferior tibiofibular ligaments appear intact.

Medial: The deltoid and visualized portions of the spring ligament
appear intact.

CARTILAGE AND BONES

Ankle Joint: Mild tibiotalar degenerative changes. No significant
joint effusion.

Subtalar Joints/Sinus Tarsi: Unremarkable.

Bones: Mild midfoot degenerative changes. No acute osseous findings.

Other: Mild subcutaneous edema posteriorly in the hindfoot and ankle
without focal fluid collection.
IMPRESSION: 1. Complete insertional rupture of the Achilles tendon with moderate
retraction. Underlying Achilles tendinosis with reactive edema in
the calcaneal tuberosity.
2. Mild attenuation of the flexor digitorum longus tendon within the
plantar aspect of the midfoot without evidence of focal tear. The
additional ankle tendons appear normal.
3. Probable chronic tear of the anterior talofibular ligament with
associated spurring.
4. Mild tibiotalar and midfoot degenerative changes.

## 2023-07-16 ENCOUNTER — Encounter: Payer: Self-pay | Admitting: Podiatry

## 2023-07-16 ENCOUNTER — Ambulatory Visit (INDEPENDENT_AMBULATORY_CARE_PROVIDER_SITE_OTHER): Admitting: Podiatry

## 2023-07-16 DIAGNOSIS — S86012A Strain of left Achilles tendon, initial encounter: Secondary | ICD-10-CM

## 2023-07-16 DIAGNOSIS — M9262 Juvenile osteochondrosis of tarsus, left ankle: Secondary | ICD-10-CM | POA: Diagnosis not present

## 2023-07-16 DIAGNOSIS — M6788 Other specified disorders of synovium and tendon, other site: Secondary | ICD-10-CM | POA: Diagnosis not present

## 2023-07-16 DIAGNOSIS — M7732 Calcaneal spur, left foot: Secondary | ICD-10-CM

## 2023-07-16 DIAGNOSIS — M62462 Contracture of muscle, left lower leg: Secondary | ICD-10-CM

## 2023-07-16 NOTE — Patient Instructions (Signed)

## 2023-07-16 NOTE — Progress Notes (Signed)
  Subjective:  Patient ID: Kendra Norman, female    DOB: 10-27-1955,  MRN: [id number]  Chief Complaint  Patient presents with   Tendonitis    re-check Achilles tendon 6 weeks follow up(DOS 06/07/21) - still having pain - she has been back in the boot consistently for 4 weeks - tendon does not feel any better or worse in the boot. She still has swelling, especially by the end of the day. She has been doing some PT exercises at home     68 y.o. female returns for post-op check.  She returns for follow-up, she has been in the boot since last visit doing her home physical therapy, has been out of the boot some still quite painful has not improved at all  Review of Systems: Negative except as noted in the HPI. Denies N/V/F/Ch.   Objective:  There were no vitals filed for this visit. There is no height or weight on file to calculate BMI. Constitutional Well developed. Well nourished.  Vascular Foot warm and well perfused. Capillary refill normal to all digits.  Calf is soft and supple, no posterior calf or knee pain, negative Homans' sign  Neurologic Normal speech. Oriented to person, place, and time. Epicritic sensation to light touch grossly present bilaterally.  Dermatologic No notable lesions  Orthopedic: No edema, she does have tenderness to the posterior heel at the insertion of the Achilles there is a palpable heel spur gastrocnemius equinus is present.  No discontinuity of the tendon.  No improvement   Multiple view plain film radiographs: New films taken today show large posterior calcaneal enthesophyte plantar enthesophyte and deformity Assessment:   1. Partial tear of left Achilles tendon, initial encounter   2. Haglund's deformity, left   3. Achilles tendinosis of left lower extremity   4. Heel spur, left   5. Gastrocnemius equinus of left lower extremity    Plan:  Patient was evaluated and treated and all questions answered.  S/p foot surgery right Considering her lack  of improvement I do think long-term we will need to consider surgical resection of the spur secondary repair of the Achilles as well as Haglund resection.  I would recommend an MRI to evaluate the tendon for partial tearing and to help us  guide surgical planning options.  Would prefer to have this addressed prior to the risk of rupturing it like she had on her other side.  Would not recommend any injection of the area at this point.  If comfortable out of the boot and using heel lift as opposed to the boot may utilize this as well.  Return after MRI to review options.  Return for after MRI to review.

## 2023-07-20 ENCOUNTER — Other Ambulatory Visit: Payer: Self-pay

## 2023-07-25 ENCOUNTER — Ambulatory Visit
Admission: RE | Admit: 2023-07-25 | Discharge: 2023-07-25 | Disposition: A | Discharge status: Home / Self Care | Payer: Self-pay | Source: Ambulatory Visit | Attending: Podiatry | Admitting: Podiatry

## 2023-07-25 DIAGNOSIS — S86012A Strain of left Achilles tendon, initial encounter: Secondary | ICD-10-CM

## 2023-07-29 ENCOUNTER — Ambulatory Visit: Payer: Self-pay | Admitting: Podiatry

## 2023-08-06 ENCOUNTER — Ambulatory Visit (INDEPENDENT_AMBULATORY_CARE_PROVIDER_SITE_OTHER): Admitting: Podiatry

## 2023-08-06 ENCOUNTER — Encounter: Payer: Self-pay | Admitting: Podiatry

## 2023-08-06 VITALS — Ht 70.0 in | Wt 217.0 lb

## 2023-08-06 DIAGNOSIS — S86012A Strain of left Achilles tendon, initial encounter: Secondary | ICD-10-CM

## 2023-08-06 DIAGNOSIS — M6788 Other specified disorders of synovium and tendon, other site: Secondary | ICD-10-CM | POA: Diagnosis not present

## 2023-08-06 DIAGNOSIS — M9262 Juvenile osteochondrosis of tarsus, left ankle: Secondary | ICD-10-CM

## 2023-08-06 DIAGNOSIS — M62462 Contracture of muscle, left lower leg: Secondary | ICD-10-CM | POA: Diagnosis not present

## 2023-08-06 NOTE — Progress Notes (Signed)
 Subjective:  Patient ID: Kendra Norman, female    DOB: 10-13-1955,  MRN: [id number]  Chief Complaint  Patient presents with   Foot Injury    RM 1 Patient is here to review MRI results.     68 y.o. female returns for follow-up on left Achilles.  Has not improved.  Ready to proceed with surgery.  Review of Systems: Negative except as noted in the HPI. Denies N/V/F/Ch.   Objective:  There were no vitals filed for this visit. Body mass index is 31.14 kg/m. Constitutional Well developed. Well nourished.  Vascular Foot warm and well perfused. Capillary refill normal to all digits.  Calf is soft and supple, no posterior calf or knee pain, negative Homans' sign  Neurologic Normal speech. Oriented to person, place, and time. Epicritic sensation to light touch grossly present bilaterally.  Dermatologic No notable lesions  Orthopedic: No edema, she does have tenderness to the posterior heel at the insertion of the Achilles there is a palpable heel spur gastrocnemius equinus is present.  No discontinuity of the tendon.  No improvement   Multiple view plain film radiographs: New films taken today show large posterior calcaneal enthesophyte plantar enthesophyte and deformity  EXAM DESCRIPTION: MR HEEL LEFT WO CONTRAST   CLINICAL HISTORY: , Partial Achilles tear, Haglund deformity and retrocalcaneal spur   COMPARISON: None Available.   TECHNIQUE: MRI of the heel is performed according to our usual protocol with multiplanar multi sequence imaging.   FINDINGS: No fracture. No erosions. Mild degenerative change to the second through fourth tarsometatarsal joints with joint space narrowing and very mild degenerative edema. Also mild degenerative change between the dorsal navicular and medial cuneiform. Mild to moderate marrow edema deep to the Achilles insertion likely reactive. The marrow signal is otherwise unremarkable.   No significant joint effusion. Adequate fat in the sinus tarsi.  The ligaments and musculature are unremarkable. There is mild thickening to the medial origin of the plantar fascia about a prominent spur. No active inflammation. This is likely mild chronic tendinopathy. There is mild thickening and mild intermediate signal to the distal Achilles tendon consistent with tendinopathy. There is no significant tear. There is likely reactive edema to the underlying calcaneus and about a spur. Mild retrocalcaneal bursal fluid and mild inflammation to the surrounding subcutaneous tissues.   IMPRESSION: Constellation of findings suggesting mild Haglund's deformity. Likely reactive marrow edema about a spur with mild retrocalcaneal bursal fluid and mild inflammation to the surrounding soft tissues. No significant tear.   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 No active inflammation.   Mild degenerative change.   Electronically signed by: Reyes Frees MD 07/25/2023 09:56 AM EDT RP Workstation: [id number]   Assessment:   1. Partial tear of left Achilles tendon, initial encounter   2. Haglund's deformity, left   3. Achilles tendinosis of left lower extremity   4. Gastrocnemius equinus of left lower extremity    Plan:  Patient was evaluated and treated and all questions answered.  S/p foot surgery right Reviewed her MRI results again and discussed that the tendon is intact and does show partial tearing.  We discussed further treatment of this, has exhausted nonsurgical treatment options at this point and is ready to proceed with surgical intervention.  We discussed debridement and repair of the tendon, resection of the heel spur and Haglund deformity as well as reanchoring of the tendon with gastrocnemius lengthening.  Discussed the overall risk benefits and potential complications.  We discussed the recovery  process.  Discussed plan for nonweightbearing for 1 month followed by weightbearing in a boot with reduction of plantarflexion wedges and  physical therapy.  Expect she will not be in regular shoes until the 8 to 12-week mark pending her progress.  Surgery will be scheduled mutually agreeable date as an outpatient.   Surgical plan:  Procedure: - Retrocalcaneal spur incision, Haglund resection, secondary Achilles repair.  Gastrocnemius recession,  Location: - [facility]  Anesthesia plan: - General With regional block  Postoperative pain plan: - Tylenol  1000 mg every 6 hours, gabapentin  300 mg every 8 hours x5 days, oxycodone  5 mg 1-2 tabs every 6 hours only as needed  DVT prophylaxis: - Xarelto  10 mg nightly  WB Restrictions / DME needs: - Nonweightbearing in plantarflexion splint postop    No follow-ups on file.

## 2023-08-08 ENCOUNTER — Telehealth: Payer: Self-pay | Admitting: Podiatry

## 2023-08-08 NOTE — Telephone Encounter (Signed)
 Received surgical consent forms.  Left message for pt to call me to get her surgery scheduled for October per her request thru my chart.

## 2023-08-08 NOTE — Telephone Encounter (Signed)
 Pt returned call and left message.  I returned call and she is scheduled for 11/15/23 and would like a earlier on in October if a Monday becomes available. She is not on blood thinners or GLP1 medications and pharmacy is correct in chart.

## 2023-08-13 ENCOUNTER — Ambulatory Visit: Admitting: Podiatry

## 2023-08-15 ENCOUNTER — Ambulatory Visit: Admitting: Podiatry

## 2023-09-03 NOTE — Telephone Encounter (Signed)
 Pt called and left message seeing if there were any openings for surgery for Dr Silva in October on a Monday.  I called pt back and we have moved her surgery to 10/13 which is a Monday at the surgery center.She said thank you for my patience.

## 2023-10-29 ENCOUNTER — Encounter: Payer: Self-pay | Admitting: Podiatry

## 2023-11-04 ENCOUNTER — Other Ambulatory Visit: Payer: Self-pay | Admitting: Podiatry

## 2023-11-04 DIAGNOSIS — M62462 Contracture of muscle, left lower leg: Secondary | ICD-10-CM | POA: Diagnosis not present

## 2023-11-04 DIAGNOSIS — M9262 Juvenile osteochondrosis of tarsus, left ankle: Secondary | ICD-10-CM | POA: Diagnosis not present

## 2023-11-04 DIAGNOSIS — M6788 Other specified disorders of synovium and tendon, other site: Secondary | ICD-10-CM | POA: Diagnosis not present

## 2023-11-04 MED ORDER — GABAPENTIN 300 MG PO CAPS: 300.0000 mg | ORAL_CAPSULE | Freq: Three times a day (TID) | ORAL | 0 refills | Status: DC

## 2023-11-04 MED ORDER — OXYCODONE HCL 5 MG PO TABS: 5.0000 mg | ORAL_TABLET | ORAL | 0 refills | Status: AC | PRN

## 2023-11-04 MED ORDER — ACETAMINOPHEN 500 MG PO TABS: 1000.0000 mg | ORAL_TABLET | Freq: Four times a day (QID) | ORAL | 0 refills | Status: AC | PRN

## 2023-11-04 MED ORDER — RIVAROXABAN 10 MG PO TABS: 10.0000 mg | ORAL_TABLET | Freq: Every day | ORAL | 0 refills | Status: AC

## 2023-11-09 ENCOUNTER — Encounter: Payer: Self-pay | Admitting: Podiatry

## 2023-11-14 ENCOUNTER — Encounter: Payer: Self-pay | Admitting: Podiatry

## 2023-11-14 ENCOUNTER — Ambulatory Visit (INDEPENDENT_AMBULATORY_CARE_PROVIDER_SITE_OTHER): Admitting: Podiatry

## 2023-11-14 ENCOUNTER — Ambulatory Visit (INDEPENDENT_AMBULATORY_CARE_PROVIDER_SITE_OTHER)

## 2023-11-14 VITALS — BP 147/75 | HR 71 | Temp 97.7°F

## 2023-11-14 DIAGNOSIS — S86012A Strain of left Achilles tendon, initial encounter: Secondary | ICD-10-CM

## 2023-11-14 NOTE — Progress Notes (Signed)
 Patient presents for post-op visit today, POV # 1 DOS 10/13 LT ACHILLES REPAIR, SPUR REMOVAL AND CALF LENGTHENING  Doing fine. Sent message about a fall 11/08/23. I landed on it. Really not having pain. Just having issues with restless leg on the left side. Every once in awhile has a spasm, finished the gabapentin  Sunday..  RN Notes: Would like to know if she should continue the Xarelto ?  Vital Signs: Today's Vitals   11/14/23 1144  BP: (!) 147/75  Pulse: 71  Temp: 97.7 F (36.5 C)  TempSrc: Oral  PainSc: 1   PainLoc: Foot      Radiographs: [x]  Taken []  Not taken  Surgical Site Assessment:  - Dressing:  [x]  Minimal dry blood, intact []  Reinforced   [x]  Changed     -RN Notes: n/a  - Incision:  [x]  CDI (clean, dry, intact)  [x]  Mild erythema  []  Drainage noted   -RN Notes: n/a  - Swelling:  []  None  [x]  Mild  []  Moderate   []  Significant     -RN Notes: n/a  - Bruising:  []  None  [x]  Present: lateral aspect of ankle and top of foot.    - Sutures/Staples:  []  None [x]  Intact  []  Removed Today  [x]  Plan to remove at next visit   -Cast/Splint/Pins: []  None [x]  Intact []  Removed Today []  Plan to remove at next visit []  Replaced  -Signs of infection:  [x]  None  []  Present - Describe: n/a  -DME:    []  None []  AFW []  Surgical shoe []  Cast  [x]  Splint  -Walking status:  []  Full WB  []  Partial WB  [x]  NWB  -Utilizing device:  []  None [x]  Knee Scooter []  Crutches []  Wheelchair    DVT assessment:  []  Denies symptoms []  Chest pain/SOB [x]  Pain in calf/redness/warmth ** calf pain occurred after fall on 11/08/2023 but lasted for only a day.   Redressed DSD and ace wrap. Educated on signs of infection, proper dressing care, pain management, and weight bearing status. Patient will contact provider with any new or worsening symptoms. The provider assessed the patient today and reviewed instructions regarding plan of care.  I evaluated the patient today with Ileana Ka, RN and her  incisions are healing well.  Her x-rays taken today show good resection of the posterior heel spur from a minimally invasive approach.  Her pain is well-controlled she has no evidence of DVT.  Continue nonweightbearing.  She returned to her short cam boot today with plantarflexion wedges.  I will see her back in 2 weeks for suture removal.  Sterile dressing with Xeroform ABD pad and Ace wrap applied she may remove this on Monday and wash her leg.

## 2023-11-14 NOTE — Patient Instructions (Signed)

## 2023-11-15 ENCOUNTER — Other Ambulatory Visit: Payer: Self-pay | Admitting: Podiatry

## 2023-11-15 DIAGNOSIS — S86012A Strain of left Achilles tendon, initial encounter: Secondary | ICD-10-CM

## 2023-11-15 MED ORDER — CYCLOBENZAPRINE HCL 5 MG PO TABS: 5.0000 mg | ORAL_TABLET | Freq: Three times a day (TID) | ORAL | 1 refills | Status: AC | PRN

## 2023-11-15 NOTE — Progress Notes (Signed)
 Prescription sent in

## 2023-11-18 ENCOUNTER — Encounter: Payer: Self-pay | Admitting: Podiatry

## 2023-11-18 DIAGNOSIS — M62462 Contracture of muscle, left lower leg: Secondary | ICD-10-CM

## 2023-11-18 DIAGNOSIS — M6788 Other specified disorders of synovium and tendon, other site: Secondary | ICD-10-CM

## 2023-11-18 DIAGNOSIS — M9262 Juvenile osteochondrosis of tarsus, left ankle: Secondary | ICD-10-CM

## 2023-11-21 ENCOUNTER — Encounter

## 2023-11-28 ENCOUNTER — Ambulatory Visit (INDEPENDENT_AMBULATORY_CARE_PROVIDER_SITE_OTHER): Admitting: Podiatry

## 2023-11-28 DIAGNOSIS — M6788 Other specified disorders of synovium and tendon, other site: Secondary | ICD-10-CM

## 2023-11-28 DIAGNOSIS — M9262 Juvenile osteochondrosis of tarsus, left ankle: Secondary | ICD-10-CM

## 2023-11-28 DIAGNOSIS — M62462 Contracture of muscle, left lower leg: Secondary | ICD-10-CM

## 2023-11-28 NOTE — Progress Notes (Signed)
 Patient presents for post-op visit today, POV # 2 DOS 10/13 LT ACHILLES REPAIR, SPUR REMOVAL AND CALF LENGTHENING  I've been doing fine since last time. I have not had much pain. I have been standing and putting weight on it when transferring only, and when in the boot, never without.Still some calf tenderness where the stretching was done..  RN Notes: n/a  Vital Signs: Today's Vitals   11/28/23 1129  PainSc: 0-No pain      Radiographs: []  Taken [x]  Not taken  Surgical Site Assessment:  - Dressing:  [x]  Minimal dry blood, intact []  Reinforced   []  Changed     -RN Notes: n/a  - Incision:  [x]  CDI (clean, dry, intact)  []  Mild erythema  []  Drainage noted   -RN Notes: n/a  - Swelling:  []  None  [x]  Mild  []  Moderate   []  Significant     -RN Notes: n/a  - Bruising:  [x]  None  []  Present: n/a   - Sutures/Staples:  []  None [x]  Intact  [x]  Removed Today  []  Plan to remove at next visit   -Cast/Splint/Pins: [x]  None []  Intact []  Removed Today []  Plan to remove at next visit []  Replaced  -Signs of infection:  [x]  None  []  Present - Describe: n/a  -DME:    []  None [x]  AFW []  Surgical shoe []  Cast  []  Splint  -Walking status:  []  Full WB  []  Partial WB  []  NWB  -Utilizing device:  []  None [x]  Knee Scooter []  Crutches []  Wheelchair    DVT assessment:  [x]  Denies symptoms []  Chest pain/SOB []  Pain in calf/redness/warmth   Redressed DSD and ace wrap. Educated on signs of infection, proper dressing care, pain management, and weight bearing status. Patient will contact provider with any new or worsening symptoms. The provider assessed the patient today and reviewed instructions regarding plan of care.  Overall doing quite well, sutures removed uneventfully and Steri-Strips applied.  Okay for bathing.  Compression sleeve applied.  Okay next week to begin some gradual partial weightbearing in the boot only.  She starts PT in 2 weeks and can transition to full weightbearing in the boot  at that point.  At that point also start to remove 1 wedge per week.  Follow-up in scheduled in 3 weeks for reevaluation.

## 2023-12-03 ENCOUNTER — Other Ambulatory Visit (HOSPITAL_BASED_OUTPATIENT_CLINIC_OR_DEPARTMENT_OTHER): Payer: Self-pay

## 2023-12-03 MED ORDER — FLUZONE HIGH-DOSE 0.5 ML IM SUSY
0.5000 mL | PREFILLED_SYRINGE | Freq: Once | INTRAMUSCULAR | 0 refills | Status: AC
  Filled 2023-12-03: qty 0.5, 1d supply, fill #0

## 2023-12-10 ENCOUNTER — Other Ambulatory Visit: Payer: Self-pay

## 2023-12-10 ENCOUNTER — Ambulatory Visit: Attending: Podiatry

## 2023-12-10 DIAGNOSIS — R262 Difficulty in walking, not elsewhere classified: Secondary | ICD-10-CM | POA: Insufficient documentation

## 2023-12-10 DIAGNOSIS — M9262 Juvenile osteochondrosis of tarsus, left ankle: Secondary | ICD-10-CM | POA: Diagnosis not present

## 2023-12-10 DIAGNOSIS — M62462 Contracture of muscle, left lower leg: Secondary | ICD-10-CM | POA: Insufficient documentation

## 2023-12-10 DIAGNOSIS — M25572 Pain in left ankle and joints of left foot: Secondary | ICD-10-CM | POA: Diagnosis present

## 2023-12-10 DIAGNOSIS — R252 Cramp and spasm: Secondary | ICD-10-CM | POA: Diagnosis present

## 2023-12-10 DIAGNOSIS — M6281 Muscle weakness (generalized): Secondary | ICD-10-CM | POA: Insufficient documentation

## 2023-12-10 DIAGNOSIS — R293 Abnormal posture: Secondary | ICD-10-CM | POA: Insufficient documentation

## 2023-12-10 DIAGNOSIS — M25672 Stiffness of left ankle, not elsewhere classified: Secondary | ICD-10-CM | POA: Diagnosis present

## 2023-12-10 DIAGNOSIS — M6788 Other specified disorders of synovium and tendon, other site: Secondary | ICD-10-CM | POA: Insufficient documentation

## 2023-12-10 NOTE — Therapy (Signed)
 OUTPATIENT PHYSICAL THERAPY LOWER EXTREMITY EVALUATION   Patient Name: Kendra Norman MRN: [id number] DOB:01/28/1955, 68 y.o., female Today's Date: 12/10/2023  END OF SESSION:  PT End of Session - 12/10/23 1535     Visit Number 1    Date for Recertification  02/04/24    Authorization Type Medicare    Progress Note Due on Visit 10    PT Start Time 1533    PT Stop Time 1617    PT Time Calculation (min) 44 min    Activity Tolerance Patient tolerated treatment well    Behavior During Therapy WFL for tasks assessed/performed          Past Medical History:  Diagnosis Date   GERD (gastroesophageal reflux disease)    Seasonal allergies    Past Surgical History:  Procedure Laterality Date   ABDOMINAL HYSTERECTOMY     BREAST BIOPSY     BREAST EXCISIONAL BIOPSY Bilateral    fibroadenomas - benign   BREAST LUMPECTOMY Bilateral    fibroadenomas - benign   VEIN SURGERY     Patient Active Problem List   Diagnosis Date Noted   Strain of muscle, fascia and tendon of lower back, initial encounter 10/16/2018    PCP: Debrah Josette ORN., PA-C  REFERRING PROVIDER: Silva Juliene SAUNDERS, DPM  REFERRING DIAG: Left achilles rupture  THERAPY DIAG:  Pain in left ankle and joints of left foot - Plan: PT plan of care cert/re-cert  Stiffness of left ankle, not elsewhere classified - Plan: PT plan of care cert/re-cert  Muscle weakness (generalized) - Plan: PT plan of care cert/re-cert  Cramp and spasm - Plan: PT plan of care cert/re-cert  Difficulty in walking, not elsewhere classified - Plan: PT plan of care cert/re-cert  Abnormal posture - Plan: PT plan of care cert/re-cert  Rationale for Evaluation and Treatment: Rehabilitation  ONSET DATE: 12/10/23  SUBJECTIVE:   SUBJECTIVE STATEMENT: Achilles repair and bone spur removal (haglunds deformity) on 11/04/23.  She reports she has been 50% WB when using her crutches at home as advised by surgeon.  She explains that provider wanted PT  to determine if she could go WBAT with boot on today.  Patient states she still has all of the wedges in the boot but is to remove one today.  She denies any pain.  She is retired.  She enjoys playing golf, walking and working out.  She works out 7 days a week.    PERTINENT HISTORY: Right achilles rupture and repair  PAIN:  Are you having pain? Yes: NPRS scale: 0/10 Pain location: none Pain description: none Aggravating factors: weight bearing Relieving factors: rest, ice  PRECAUTIONS: Fall  RED FLAGS: None   WEIGHT BEARING RESTRICTIONS: MD   FALLS:  Has patient fallen in last 6 months? No  LIVING ENVIRONMENT: Lives with: lives with their spouse Lives in: House/apartment Stairs: built a ramp and armed forces logistics/support/administrative officer is on same level Has following equipment at home: knee scooter  OCCUPATION: retired  PLOF: Independent, Independent with basic ADLs, Independent with household mobility without device, Independent with community mobility without device, Independent with homemaking with ambulation, Independent with gait, and Independent with transfers  PATIENT GOALS: to be able to return to her usual workouts and walk normally  NEXT MD VISIT: 12/17/23  OBJECTIVE:  Note: Objective measures were completed at Evaluation unless otherwise noted.  DIAGNOSTIC FINDINGS: na  PATIENT SURVEYS:  LEFS  Extreme difficulty/unable (0), Quite a bit of difficulty (1), Moderate difficulty (2), Little difficulty (  3), No difficulty (4) Survey date:  12/10/23  Score total:  20/80     COGNITION: Overall cognitive status: Within functional limits for tasks assessed     SENSATION: WFL   POSTURE: Bilateral knee valgus  PALPATION: na  LOWER EXTREMITY ROM:  Active ROM Right eval Left eval  Ankle dorsiflexion  90  Ankle plantarflexion  40  Ankle inversion  32  Ankle eversion  10   (Blank rows = not tested)  LOWER EXTREMITY MMT: deferred due to acuity  MMT Right eval Left eval  Ankle  dorsiflexion    Ankle plantarflexion    Ankle inversion    Ankle eversion     (Blank rows = not tested)  LOWER EXTREMITY SPECIAL TESTS:  Ankle special tests: na  FUNCTIONAL TESTS:  5 times sit to stand: 11.16 sec Timed up and go (TUG): 19.19 sec  GAIT: Distance walked: 30 FEET Assistive device utilized: knee scooter Level of assistance: Complete Independence Comments: enjoys walking, golfing and working out  [employer]: 12 figure 8                                                                                                                              TREATMENT DATE:  12/10/23  Initial eval completed and initiated HEP   PATIENT EDUCATION:  Education details: Initiated HEP Person educated: Patient Education method: Programmer, Multimedia, Facilities Manager, Verbal cues, and Handouts Education comprehension: verbalized understanding, returned demonstration, and verbal cues required  HOME EXERCISE PROGRAM: Access Code: [email or web address] URL: [email or web address] Date: 12/10/2023 Prepared by: Delon Haddock  Exercises - Seated Toe Taps  - 1 x daily - 7 x weekly - 3 sets - 10 reps - Seated Heel Raise  - 1 x daily - 7 x weekly - 3 sets - 10 reps - Seated Arch Lifts  - 1 x daily - 7 x weekly - 3 sets - 10 reps - Seated Ankle Alphabet  - 1 x daily - 7 x weekly - 1 sets - 1 reps - a-z hold - Seated Quad Set  - 1 x daily - 7 x weekly - 1 sets - 20 reps  ASSESSMENT:  CLINICAL IMPRESSION: Patient is a 68 y.o. female who was seen today for physical therapy evaluation and treatment for left achilles rupture.  She presents with decreased left ankle ROM, strength and function along with elevated pain.  She would benefit from    OBJECTIVE IMPAIRMENTS: Abnormal gait, decreased balance, difficulty walking, decreased ROM, decreased strength, increased fascial restrictions, increased muscle spasms, impaired flexibility, improper body mechanics, postural dysfunction, and pain.   ACTIVITY  LIMITATIONS: carrying, lifting, bending, standing, squatting, sleeping, stairs, transfers, bed mobility, bathing, toileting, dressing, and caring for others  PARTICIPATION LIMITATIONS: meal prep, cleaning, laundry, driving, shopping, community activity, occupation, and yard work  PERSONAL FACTORS: Age and Past/current experiences are also affecting patient's functional outcome.   REHAB POTENTIAL: Good  CLINICAL DECISION MAKING: Stable/uncomplicated  EVALUATION COMPLEXITY: Low  GOALS: Goals reviewed with patient? Yes  SHORT TERM GOALS: Target date: 01/07/2024  Pain report to be no greater than 4/10  Baseline: Goal status: INITIAL  2.  Patient will be independent with initial HEP  Baseline:  Goal status: INITIAL   LONG TERM GOALS: Target date: 02/04/2024  Patient to report pain no greater than 2/10  Baseline:  Goal status: INITIAL  2.  Patient to be independent with advanced HEP  Baseline:  Goal status: INITIAL  3.  Functional scores to improve by 2-3 seconds Baseline:  Goal status: INITIAL  4.  LEFS to improve to 66/80 or better Baseline:  Goal status: INITIAL  5.  Patient to be able to ambulate with normal heel to toe progression with no a.d. and no boot Baseline:  Goal status: INITIAL  6.  Patient to be able to ascend and descend steps without pain or no greater than 2/10  Baseline:  Goal status: INITIAL   PLAN:  PT FREQUENCY: 2x/week  PT DURATION: 8 weeks  PLANNED INTERVENTIONS: 97110-Therapeutic exercises, 97530- Therapeutic activity, 97112- Neuromuscular re-education, 97535- Self Care, 02859- Manual therapy, [phone]- Gait training, [phone]- Canalith repositioning, [id number]- Aquatic Therapy, [phone]- Electrical stimulation (unattended), [phone]- Electrical stimulation (manual), [id number]- Vasopneumatic device, [id number]- Ultrasound, [id number]- Ionotophoresis 4mg /ml Dexamethasone, 79439 (1-2 muscles), 20561 (3+ muscles)- Dry Needling, Patient/Family education, Balance training,  Stair training, Taping, Joint mobilization, Scar mobilization, Vestibular training, DME instructions, Cryotherapy, and Moist heat  PLAN FOR NEXT SESSION: Review HEP, Nustep, progress ankle ROM, gentle manual stretching avoiding DF past 90 degrees, gait training with boot and single axillary crutch   Cherisse Carrell B. Mashell Sieben, PT 12/10/23 5:50 PM [facility] [address] [place], [place] [address] Phone # [phone] Fax [phone]

## 2023-12-12 ENCOUNTER — Ambulatory Visit

## 2023-12-12 DIAGNOSIS — R293 Abnormal posture: Secondary | ICD-10-CM

## 2023-12-12 DIAGNOSIS — R262 Difficulty in walking, not elsewhere classified: Secondary | ICD-10-CM

## 2023-12-12 DIAGNOSIS — M25572 Pain in left ankle and joints of left foot: Secondary | ICD-10-CM

## 2023-12-12 DIAGNOSIS — M25672 Stiffness of left ankle, not elsewhere classified: Secondary | ICD-10-CM

## 2023-12-12 DIAGNOSIS — R252 Cramp and spasm: Secondary | ICD-10-CM

## 2023-12-12 DIAGNOSIS — M6281 Muscle weakness (generalized): Secondary | ICD-10-CM

## 2023-12-12 NOTE — Therapy (Signed)
 OUTPATIENT PHYSICAL THERAPY LOWER EXTREMITY EVALUATION   Patient Name: Kendra Norman MRN: [id number] DOB:09/13/1955, 68 y.o., female Today's Date: 12/12/2023  END OF SESSION:  PT End of Session - 12/12/23 1541     Visit Number 2    Date for Recertification  02/04/24    Authorization Type Medicare    Progress Note Due on Visit 10    PT Start Time 1530    Activity Tolerance Patient tolerated treatment well    Behavior During Therapy [facility] for tasks assessed/performed          Past Medical History:  Diagnosis Date   GERD (gastroesophageal reflux disease)    Seasonal allergies    Past Surgical History:  Procedure Laterality Date   ABDOMINAL HYSTERECTOMY     BREAST BIOPSY     BREAST EXCISIONAL BIOPSY Bilateral    fibroadenomas - benign   BREAST LUMPECTOMY Bilateral    fibroadenomas - benign   VEIN SURGERY     Patient Active Problem List   Diagnosis Date Noted   Strain of muscle, fascia and tendon of lower back, initial encounter 10/16/2018    PCP: Kendra Josette ORN., PA-C  REFERRING PROVIDER: Silva Juliene Norman, DPM  REFERRING DIAG: Left achilles rupture  THERAPY DIAG:  Pain in left ankle and joints of left foot  Stiffness of left ankle, not elsewhere classified  Muscle weakness (generalized)  Difficulty in walking, not elsewhere classified  Cramp and spasm  Abnormal posture  Rationale for Evaluation and Treatment: Rehabilitation  ONSET DATE: 12/10/23  SUBJECTIVE:   SUBJECTIVE STATEMENT: Patient reports no pain.  Just gets sore if I am up on it for a while  Arrives with crutches today.  States she is using these most of the time now.      PERTINENT HISTORY: Right achilles rupture and repair  PAIN:  12/12/23 Are you having pain? Yes: NPRS scale: 0/10 Pain location: none Pain description: none Aggravating factors: weight bearing Relieving factors: rest, ice  PRECAUTIONS: Fall  RED FLAGS: None   WEIGHT BEARING RESTRICTIONS: MD   FALLS:   Has patient fallen in last 6 months? No  LIVING ENVIRONMENT: Lives with: lives with their spouse Lives in: House/apartment Stairs: built a ramp and armed forces logistics/support/administrative officer is on same level Has following equipment at home: knee scooter  OCCUPATION: retired  PLOF: Independent, Independent with basic ADLs, Independent with household mobility without device, Independent with community mobility without device, Independent with homemaking with ambulation, Independent with gait, and Independent with transfers  PATIENT GOALS: to be able to return to her usual workouts and walk normally  NEXT MD VISIT: 12/17/23  OBJECTIVE:  Note: Objective measures were completed at Evaluation unless otherwise noted.  DIAGNOSTIC FINDINGS: na  PATIENT SURVEYS:  LEFS  Extreme difficulty/unable (0), Quite a bit of difficulty (1), Moderate difficulty (2), Little difficulty (3), No difficulty (4) Survey date:  12/10/23  Score total:  20/80     COGNITION: Overall cognitive status: Within functional limits for tasks assessed     SENSATION: WFL   POSTURE: Bilateral knee valgus  PALPATION: na  LOWER EXTREMITY ROM:  Active ROM Right eval Left eval  Ankle dorsiflexion  90  Ankle plantarflexion  40  Ankle inversion  32  Ankle eversion  10   (Blank rows = not tested)  LOWER EXTREMITY MMT: deferred due to acuity  MMT Right eval Left eval  Ankle dorsiflexion    Ankle plantarflexion    Ankle inversion    Ankle eversion     (  Blank rows = not tested)  LOWER EXTREMITY SPECIAL TESTS:  Ankle special tests: na  FUNCTIONAL TESTS:  5 times sit to stand: 11.16 sec Timed up and go (TUG): 19.19 sec  GAIT: Distance walked: 30 FEET Assistive device utilized: knee scooter Level of assistance: Complete Independence Comments: enjoys walking, golfing and working out  [employer]: 12 figure 8                                                                                                                               TREATMENT DATE:  12/12/23 Nustep x 5 min level 3 Gait training with axillary crutches x 50 feet working on step length and heel to toe progression Standing at barre: weight shift side to side x 20 in boot Standing at barre: weight shift fwd/back 1 set of 20 with right leg in front and 1 set with left leg in front in boot Seated toe taps and heel raises x 20 each Seated short foot x 20 Seated towel inversion/eversion x 10 Seated LAQ x 20 with 5 lbs x 20 Seated hip flexion x 20 with 5 lbs x 20 Seated clam with red loop x 20 PROM PF, Inv/Ev, toe ROM  Seated yellow tband very light resistance DF, Inv and Ev x 20 each Ankle alphabet A-Z Declined ice  12/10/23  Initial eval completed and initiated HEP   PATIENT EDUCATION:  Education details: Initiated HEP Person educated: Patient Education method: Programmer, Multimedia, Facilities Manager, Verbal cues, and Handouts Education comprehension: verbalized understanding, returned demonstration, and verbal cues required  HOME EXERCISE PROGRAM: Access Code: [email or web address] URL: [email or web address] Date: 12/10/2023 Prepared by: Kendra Norman  Exercises - Seated Toe Taps  - 1 x daily - 7 x weekly - 3 sets - 10 reps - Seated Heel Raise  - 1 x daily - 7 x weekly - 3 sets - 10 reps - Seated Arch Lifts  - 1 x daily - 7 x weekly - 3 sets - 10 reps - Seated Ankle Alphabet  - 1 x daily - 7 x weekly - 1 sets - 1 reps - a-z hold - Seated Quad Set  - 1 x daily - 7 x weekly - 1 sets - 20 reps  ASSESSMENT:  CLINICAL IMPRESSION: Kendra Norman is progressing appropriately.  She has very little pain.  ROM is fairly normal with exception of DF which is limited by protocol to 90 degrees.  She ambulates safely with axillary crutches.  She is compliant and well motivated.  She should continue to do well.  She would benefit from continuing skilled PT for achilles repair protocol.    OBJECTIVE IMPAIRMENTS: Abnormal gait, decreased balance, difficulty walking,  decreased ROM, decreased strength, increased fascial restrictions, increased muscle spasms, impaired flexibility, improper body mechanics, postural dysfunction, and pain.   ACTIVITY LIMITATIONS: carrying, lifting, bending, standing, squatting, sleeping, stairs, transfers, bed mobility, bathing, toileting, dressing, and caring for others  PARTICIPATION LIMITATIONS: meal prep, cleaning, laundry, driving, shopping,  community activity, occupation, and yard work  PERSONAL FACTORS: Age and Past/current experiences are also affecting patient's functional outcome.   REHAB POTENTIAL: Good  CLINICAL DECISION MAKING: Stable/uncomplicated  EVALUATION COMPLEXITY: Low   GOALS: Goals reviewed with patient? Yes  SHORT TERM GOALS: Target date: 01/07/2024  Pain report to be no greater than 4/10  Baseline: Goal status: INITIAL  2.  Patient will be independent with initial HEP  Baseline:  Goal status: INITIAL   LONG TERM GOALS: Target date: 02/04/2024  Patient to report pain no greater than 2/10  Baseline:  Goal status: INITIAL  2.  Patient to be independent with advanced HEP  Baseline:  Goal status: INITIAL  3.  Functional scores to improve by 2-3 seconds Baseline:  Goal status: INITIAL  4.  LEFS to improve to 66/80 or better Baseline:  Goal status: INITIAL  5.  Patient to be able to ambulate with normal heel to toe progression with no a.d. and no boot Baseline:  Goal status: INITIAL  6.  Patient to be able to ascend and descend steps without pain or no greater than 2/10  Baseline:  Goal status: INITIAL   PLAN:  PT FREQUENCY: 2x/week  PT DURATION: 8 weeks  PLANNED INTERVENTIONS: 97110-Therapeutic exercises, 97530- Therapeutic activity, 97112- Neuromuscular re-education, 97535- Self Care, 02859- Manual therapy, [id number]- Gait training, [phone]- Canalith repositioning, [id number]- Aquatic Therapy, [phone]- Electrical stimulation (unattended), [phone]- Electrical stimulation (manual), [id number]-  Vasopneumatic device, [id number]- Ultrasound, [id number]- Ionotophoresis 4mg /ml Dexamethasone, 20560 (1-2 muscles), 20561 (3+ muscles)- Dry Needling, Patient/Family education, Balance training, Stair training, Taping, Joint mobilization, Scar mobilization, Vestibular training, DME instructions, Cryotherapy, and Moist heat  PLAN FOR NEXT SESSION: Using Water Quality Scientist ([employer]) achilles repair protocol,  See blue sticky note for wedge removal schedule per surgeon.  Nustep, progress ankle ROM, gentle manual stretching avoiding DF past 90 degrees, gait training with boot and single axillary crutch   Amazin Pincock B. Letti Towell, PT 12/12/23 4:16 PM [facility] [address] [place], [place] [address] Phone # [phone] Fax [phone]

## 2023-12-17 ENCOUNTER — Ambulatory Visit (INDEPENDENT_AMBULATORY_CARE_PROVIDER_SITE_OTHER)

## 2023-12-17 ENCOUNTER — Ambulatory Visit (INDEPENDENT_AMBULATORY_CARE_PROVIDER_SITE_OTHER): Admitting: Podiatry

## 2023-12-17 DIAGNOSIS — S86012A Strain of left Achilles tendon, initial encounter: Secondary | ICD-10-CM

## 2023-12-17 DIAGNOSIS — M9262 Juvenile osteochondrosis of tarsus, left ankle: Secondary | ICD-10-CM

## 2023-12-17 NOTE — Progress Notes (Signed)
  Subjective:  Patient ID: Kendra Norman, female    DOB: [birth year],  MRN: [id number]  No chief complaint on file.   DOS: 11/04/2023 Procedure: MIS Haglund and retro-Calc spur resection, secondary Achilles repair and gastrocnemius recession  68 y.o. female returns for post-op check.   Review of Systems: Negative except as noted in the HPI. Denies N/V/F/Ch.   Objective:  There were no vitals filed for this visit. There is no height or weight on file to calculate BMI. Constitutional Well developed. Well nourished.  Vascular Foot warm and well perfused. Capillary refill normal to all digits.  Calf is soft and supple, no posterior calf or knee pain, negative Homans' sign  Neurologic Normal speech. Oriented to person, place, and time. Epicritic sensation to light touch grossly present bilaterally.  Dermatologic Skin healing well without signs of infection. Skin edges well coapted without signs of infection.  Orthopedic: Tenderness to palpation noted about the surgical site.   Multiple view plain film radiographs: Good resection of Haglund deformity some residual calcifications of tendon and posterior portion but significant improvement Assessment:   1. Haglund's deformity, left    Plan:  Patient was evaluated and treated and all questions answered.  S/p foot surgery left -Progressing as expected post-operatively.  Reviewed her x-rays.  She will continue to remove heel wedges weekly until she is flat and then remain flat in the boot walking as tolerated with physical therapy for 1 to 2 weeks I expect by around Christmas she should be able to tolerate transition back to a regular shoe with a felt heel lift which I dispensed.  Continue PT and return to see me in 6 weeks to reevaluate.   Return in about 6 weeks (around 01/28/2024) for post op (no x-rays).

## 2023-12-18 ENCOUNTER — Encounter: Payer: Self-pay | Admitting: Physical Therapy

## 2023-12-18 ENCOUNTER — Ambulatory Visit: Admitting: Physical Therapy

## 2023-12-18 DIAGNOSIS — M25572 Pain in left ankle and joints of left foot: Secondary | ICD-10-CM

## 2023-12-18 DIAGNOSIS — M25672 Stiffness of left ankle, not elsewhere classified: Secondary | ICD-10-CM

## 2023-12-18 DIAGNOSIS — M6281 Muscle weakness (generalized): Secondary | ICD-10-CM

## 2023-12-18 DIAGNOSIS — R262 Difficulty in walking, not elsewhere classified: Secondary | ICD-10-CM

## 2023-12-18 NOTE — Therapy (Signed)
 OUTPATIENT PHYSICAL THERAPY LOWER EXTREMITY EVALUATION   Patient Name: Kendra Norman MRN: [id number] DOB:08-03-1955, 68 y.o., female Today's Date: 12/18/2023  END OF SESSION:  PT End of Session - 12/18/23 1100     Visit Number 3    Date for Recertification  02/04/24    Authorization Type Medicare    Progress Note Due on Visit 10    PT Start Time 1101    PT Stop Time 1144    PT Time Calculation (min) 43 min    Activity Tolerance Patient tolerated treatment well          Past Medical History:  Diagnosis Date   GERD (gastroesophageal reflux disease)    Seasonal allergies    Past Surgical History:  Procedure Laterality Date   ABDOMINAL HYSTERECTOMY     BREAST BIOPSY     BREAST EXCISIONAL BIOPSY Bilateral    fibroadenomas - benign   BREAST LUMPECTOMY Bilateral    fibroadenomas - benign   VEIN SURGERY     Patient Active Problem List   Diagnosis Date Noted   Strain of muscle, fascia and tendon of lower back, initial encounter 10/16/2018    PCP: Debrah Josette ORN., PA-C  REFERRING PROVIDER: Silva Juliene SAUNDERS, DPM  REFERRING DIAG: Left achilles rupture  THERAPY DIAG:  Pain in left ankle and joints of left foot  Stiffness of left ankle, not elsewhere classified  Muscle weakness (generalized)  Difficulty in walking, not elsewhere classified  Rationale for Evaluation and Treatment: Rehabilitation  ONSET DATE: 12/10/23  SUBJECTIVE:   SUBJECTIVE STATEMENT: Saw the doctor yesterday and he said by Christmas day in a shoe.  He wants me to not go past 90 degrees.  Using a single crutch but it's awkward.  Took the 2nd layer of wedge out.       PERTINENT HISTORY: Right achilles rupture and repair  PAIN: 12/18/23 Are you having pain? Yes: NPRS scale: 0/10 Pain location: none Pain description: none Aggravating factors: weight bearing Relieving factors: rest, ice  PRECAUTIONS: Fall  RED FLAGS: None   WEIGHT BEARING RESTRICTIONS: MD   FALLS:  Has patient  fallen in last 6 months? No  LIVING ENVIRONMENT: Lives with: lives with their spouse Lives in: House/apartment Stairs: built a ramp and armed forces logistics/support/administrative officer is on same level Has following equipment at home: knee scooter  OCCUPATION: retired  PLOF: Independent, Independent with basic ADLs, Independent with household mobility without device, Independent with community mobility without device, Independent with homemaking with ambulation, Independent with gait, and Independent with transfers  PATIENT GOALS: to be able to return to her usual workouts and walk normally  NEXT MD VISIT: 12/17/23  OBJECTIVE:  Note: Objective measures were completed at Evaluation unless otherwise noted.  DIAGNOSTIC FINDINGS: na  PATIENT SURVEYS:  LEFS  Extreme difficulty/unable (0), Quite a bit of difficulty (1), Moderate difficulty (2), Little difficulty (3), No difficulty (4) Survey date:  12/10/23  Score total:  20/80     COGNITION: Overall cognitive status: Within functional limits for tasks assessed     SENSATION: WFL   POSTURE: Bilateral knee valgus  PALPATION: na  LOWER EXTREMITY ROM:  Active ROM Right eval Left eval  Ankle dorsiflexion  90  Ankle plantarflexion  40  Ankle inversion  32  Ankle eversion  10   (Blank rows = not tested)  LOWER EXTREMITY MMT: deferred due to acuity  MMT Right eval Left eval  Ankle dorsiflexion    Ankle plantarflexion    Ankle inversion  Ankle eversion     (Blank rows = not tested)  LOWER EXTREMITY SPECIAL TESTS:  Ankle special tests: na  FUNCTIONAL TESTS:  5 times sit to stand: 11.16 sec Timed up and go (TUG): 19.19 sec  GAIT: Distance walked: 30 FEET Assistive device utilized: knee scooter Level of assistance: Complete Independence Comments: enjoys walking, golfing and working out  [employer]: 12 figure 8                                                                                                                              TREATMENT  DATE:  12/18/23 Nustep x 7 min level 4 Discussed use of her home bike with seat further back to avoid DF > 90 degrees Gait training options crutch vs cane pros and cons Seated short foot x 20 Towel scrunches with toes (very difficult secondary to dorsal foot edema) Instruction on Use of cold for edema management 5-10 min 3-4x/day Seated towel inversion/eversion x 10 Seated LAQ x 20 with 5 lbs x 20 Seated hip flexion x 20 with 5 lbs x 20 Seated clam with green band  x 20 PROM PF, Inv/Ev, toe ROM  Metatarsal mobs grade 2 Seated yellow tband very light resistance DF, Inv and Ev x 10 each Vasocompression moderate compression 10 min with elevation for edema management   12/12/23 Nustep x 5 min level 3 Gait training with axillary crutches x 50 feet working on step length and heel to toe progression Standing at barre: weight shift side to side x 20 in boot Standing at barre: weight shift fwd/back 1 set of 20 with right leg in front and 1 set with left leg in front in boot Seated toe taps and heel raises x 20 each Seated short foot x 20 Seated towel inversion/eversion x 10 Seated LAQ x 20 with 5 lbs x 20 Seated hip flexion x 20 with 5 lbs x 20 Seated clam with red loop x 20 PROM PF, Inv/Ev, toe ROM  Seated yellow tband very light resistance DF, Inv and Ev x 20 each Ankle alphabet A-Z Declined ice  12/10/23  Initial eval completed and initiated HEP   PATIENT EDUCATION:  Education details: Initiated HEP Person educated: Patient Education method: Programmer, Multimedia, Facilities Manager, Verbal cues, and Handouts Education comprehension: verbalized understanding, returned demonstration, and verbal cues required  HOME EXERCISE PROGRAM: Access Code: [email or web address] URL: [email or web address] Date: 12/10/2023 Prepared by: Delon Haddock  Exercises - Seated Toe Taps  - 1 x daily - 7 x weekly - 3 sets - 10 reps - Seated Heel Raise  - 1 x daily - 7 x weekly - 3 sets - 10 reps - Seated  Arch Lifts  - 1 x daily - 7 x weekly - 3 sets - 10 reps - Seated Ankle Alphabet  - 1 x daily - 7 x weekly - 1 sets - 1 reps - a-z hold - Seated Quad Set  - 1 x daily - 7 x  weekly - 1 sets - 20 reps  ASSESSMENT:  CLINICAL IMPRESSION: Low pain levels although edema persists and affects toe/foot motor control.  Therapist instructed in use of cold packs with elevation for edema management.  Therapist ensuring surgical precautions followed including avoidance of DF > 90 degrees.  Good response to vasocompression for edema control post exercise.  On track with expected post op course of recovery.     OBJECTIVE IMPAIRMENTS: Abnormal gait, decreased balance, difficulty walking, decreased ROM, decreased strength, increased fascial restrictions, increased muscle spasms, impaired flexibility, improper body mechanics, postural dysfunction, and pain.   ACTIVITY LIMITATIONS: carrying, lifting, bending, standing, squatting, sleeping, stairs, transfers, bed mobility, bathing, toileting, dressing, and caring for others  PARTICIPATION LIMITATIONS: meal prep, cleaning, laundry, driving, shopping, community activity, occupation, and yard work  PERSONAL FACTORS: Age and Past/current experiences are also affecting patient's functional outcome.   REHAB POTENTIAL: Good  CLINICAL DECISION MAKING: Stable/uncomplicated  EVALUATION COMPLEXITY: Low   GOALS: Goals reviewed with patient? Yes  SHORT TERM GOALS: Target date: 01/07/2024  Pain report to be no greater than 4/10  Baseline: Goal status: INITIAL  2.  Patient will be independent with initial HEP  Baseline:  Goal status: INITIAL   LONG TERM GOALS: Target date: 02/04/2024  Patient to report pain no greater than 2/10  Baseline:  Goal status: INITIAL  2.  Patient to be independent with advanced HEP  Baseline:  Goal status: INITIAL  3.  Functional scores to improve by 2-3 seconds Baseline:  Goal status: INITIAL  4.  LEFS to improve to 66/80  or better Baseline:  Goal status: INITIAL  5.  Patient to be able to ambulate with normal heel to toe progression with no a.d. and no boot Baseline:  Goal status: INITIAL  6.  Patient to be able to ascend and descend steps without pain or no greater than 2/10  Baseline:  Goal status: INITIAL   PLAN:  PT FREQUENCY: 2x/week  PT DURATION: 8 weeks  PLANNED INTERVENTIONS: 97110-Therapeutic exercises, 97530- Therapeutic activity, 97112- Neuromuscular re-education, 97535- Self Care, 02859- Manual therapy, [phone]- Gait training, [phone]- Canalith repositioning, [id number]- Aquatic Therapy, [phone]- Electrical stimulation (unattended), [phone]- Electrical stimulation (manual), [id number]- Vasopneumatic device, [id number]- Ultrasound, [id number]- Ionotophoresis 4mg /ml Dexamethasone, 20560 (1-2 muscles), 20561 (3+ muscles)- Dry Needling, Patient/Family education, Balance training, Stair training, Taping, Joint mobilization, Scar mobilization, Vestibular training, DME instructions, Cryotherapy, and Moist heat  PLAN FOR NEXT SESSION: Using Water Quality Scientist ([employer]) achilles repair protocol,  See blue sticky note for wedge removal schedule per surgeon.  Nustep, progress ankle ROM, gentle manual stretching avoiding DF past 90 degrees, gait training with boot and single axillary crutch; vasocompression  Glade Pesa, PT 12/18/23 12:23 PM Phone: [phone] Fax: [phone]  [facility] [address], [address] [place], [place] [address] Phone # [phone] Fax [phone]

## 2023-12-25 ENCOUNTER — Ambulatory Visit: Payer: Self-pay | Admitting: Physical Therapy

## 2023-12-25 ENCOUNTER — Encounter: Payer: Self-pay | Admitting: Physical Therapy

## 2023-12-25 DIAGNOSIS — R252 Cramp and spasm: Secondary | ICD-10-CM | POA: Diagnosis present

## 2023-12-25 DIAGNOSIS — M6281 Muscle weakness (generalized): Secondary | ICD-10-CM | POA: Insufficient documentation

## 2023-12-25 DIAGNOSIS — R262 Difficulty in walking, not elsewhere classified: Secondary | ICD-10-CM | POA: Diagnosis present

## 2023-12-25 DIAGNOSIS — R293 Abnormal posture: Secondary | ICD-10-CM | POA: Insufficient documentation

## 2023-12-25 DIAGNOSIS — M25672 Stiffness of left ankle, not elsewhere classified: Secondary | ICD-10-CM | POA: Diagnosis present

## 2023-12-25 DIAGNOSIS — M25572 Pain in left ankle and joints of left foot: Secondary | ICD-10-CM | POA: Insufficient documentation

## 2023-12-25 NOTE — Therapy (Signed)
 OUTPATIENT PHYSICAL THERAPY LOWER EXTREMITY PROGRESS NOTE   Patient Name: Kendra Norman MRN: [id number] DOB:Jun 30, 1955, 68 y.o., female Today's Date: 12/25/2023  END OF SESSION:  PT End of Session - 12/25/23 1055     Visit Number 4    Date for Recertification  02/04/24    Authorization Type Medicare    Progress Note Due on Visit 10    PT Start Time 1100    PT Stop Time 1140    PT Time Calculation (min) 40 min    Activity Tolerance Patient tolerated treatment well          Past Medical History:  Diagnosis Date   GERD (gastroesophageal reflux disease)    Seasonal allergies    Past Surgical History:  Procedure Laterality Date   ABDOMINAL HYSTERECTOMY     BREAST BIOPSY     BREAST EXCISIONAL BIOPSY Bilateral    fibroadenomas - benign   BREAST LUMPECTOMY Bilateral    fibroadenomas - benign   VEIN SURGERY     Patient Active Problem List   Diagnosis Date Noted   Strain of muscle, fascia and tendon of lower back, initial encounter 10/16/2018    PCP: Debrah Josette ORN., PA-C  REFERRING PROVIDER: Silva Juliene SAUNDERS, DPM  REFERRING DIAG: Left achilles rupture  THERAPY DIAG:  Pain in left ankle and joints of left foot  Stiffness of left ankle, not elsewhere classified  Muscle weakness (generalized)  Difficulty in walking, not elsewhere classified  Rationale for Evaluation and Treatment: Rehabilitation  ONSET DATE: 12/10/23  SUBJECTIVE:   SUBJECTIVE STATEMENT: No real issues.  Using crutch only for long distances.  Tried the bike at home at L5 and it was achey.  I dialed it back down to L4. Coming down stairs is tricky in the boot.  I did take the 3rd wedge out and I'm down to 2.      Last visit: Saw the doctor yesterday and he said by Christmas day in a shoe.  He wants me to not go past 90 degrees.  Using a single crutch but it's awkward.  Took the 2nd layer of wedge out.       PERTINENT HISTORY: Right achilles rupture and repair  PAIN: 12/25/23 Are you  having pain? Yes: NPRS scale: 0/10 Pain location: none Pain description: none Aggravating factors: weight bearing Relieving factors: rest, ice  PRECAUTIONS: Fall  RED FLAGS: None   WEIGHT BEARING RESTRICTIONS: MD   FALLS:  Has patient fallen in last 6 months? No  LIVING ENVIRONMENT: Lives with: lives with their spouse Lives in: House/apartment Stairs: built a ramp and armed forces logistics/support/administrative officer is on same level Has following equipment at home: knee scooter  OCCUPATION: retired  PLOF: Independent, Independent with basic ADLs, Independent with household mobility without device, Independent with community mobility without device, Independent with homemaking with ambulation, Independent with gait, and Independent with transfers  PATIENT GOALS: to be able to return to her usual workouts and walk normally; return to golf  NEXT MD VISIT: 12/17/23  OBJECTIVE:  Note: Objective measures were completed at Evaluation unless otherwise noted.  DIAGNOSTIC FINDINGS: na  PATIENT SURVEYS:  LEFS  Extreme difficulty/unable (0), Quite a bit of difficulty (1), Moderate difficulty (2), Little difficulty (3), No difficulty (4) Survey date:  12/10/23  Score total:  20/80     COGNITION: Overall cognitive status: Within functional limits for tasks assessed     SENSATION: WFL   POSTURE: Bilateral knee valgus  PALPATION: na  LOWER EXTREMITY ROM:  Active ROM Right eval Left eval  Ankle dorsiflexion  90  Ankle plantarflexion  40  Ankle inversion  32  Ankle eversion  10   (Blank rows = not tested)  LOWER EXTREMITY MMT: deferred due to acuity  MMT Right eval Left eval  Ankle dorsiflexion    Ankle plantarflexion    Ankle inversion    Ankle eversion     (Blank rows = not tested)  LOWER EXTREMITY SPECIAL TESTS:  Ankle special tests: na  FUNCTIONAL TESTS:  5 times sit to stand: 11.16 sec Timed up and go (TUG): 19.19 sec  GAIT: Distance walked: 30 FEET Assistive device utilized: knee  scooter Level of assistance: Complete Independence Comments: enjoys walking, golfing and working out  [employer]: 12 figure 8                                                                                                                              TREATMENT DATE:  12/25/23 Nustep x 8 min level 5 while discussing status  Manual therapy: metatarsal mobs grade 2; PROM PF, Inv/Ev, toe ROM  Seated short foot x 20 Towel scrunches with toes 10x Seated LAQ x 20 with 7.5 lbs x 20 Seated  red band resistance DF, Inv and Ev x 10 each, plus 5 reps each 2nd set of each Standing green band hip extension and hip abduction 10x each right and left Side planks on knees and propped on elbow 12x left  Bridge with Les over green ball 2x10 HS curls in bridge over green ball 10x Vasocompression moderate compression, coldest setting 10 min with elevation for edema management  Discussion of expected progression next week  12/18/23 Nustep x 7 min level 4 Discussed use of her home bike with seat further back to avoid DF > 90 degrees Gait training options crutch vs cane pros and cons Seated short foot x 20 Towel scrunches with toes (very difficult secondary to dorsal foot edema) Instruction on Use of cold for edema management 5-10 min 3-4x/day Seated towel inversion/eversion x 10 Seated LAQ x 20 with 5 lbs x 20 Seated hip flexion x 20 with 5 lbs x 20 Seated clam with green band  x 20 PROM PF, Inv/Ev, toe ROM  Metatarsal mobs grade 2 Seated yellow tband very light resistance DF, Inv and Ev x 10 each Vasocompression moderate compression 10 min with elevation for edema management   12/12/23 Nustep x 5 min level 3 Gait training with axillary crutches x 50 feet working on step length and heel to toe progression Standing at barre: weight shift side to side x 20 in boot Standing at barre: weight shift fwd/back 1 set of 20 with right leg in front and 1 set with left leg in front in boot Seated toe taps and heel  raises x 20 each Seated short foot x 20 Seated towel inversion/eversion x 10 Seated LAQ x 20 with 5 lbs x 20 Seated hip flexion x  20 with 5 lbs x 20 Seated clam with red loop x 20 PROM PF, Inv/Ev, toe ROM  Seated yellow tband very light resistance DF, Inv and Ev x 20 each Ankle alphabet A-Z Declined ice  12/10/23  Initial eval completed and initiated HEP   PATIENT EDUCATION:  Education details: Initiated HEP Person educated: Patient Education method: Programmer, Multimedia, Facilities Manager, Verbal cues, and Handouts Education comprehension: verbalized understanding, returned demonstration, and verbal cues required  HOME EXERCISE PROGRAM: Access Code: [email or web address] URL: [email or web address] Date: 12/10/2023 Prepared by: Delon Haddock  Exercises - Seated Toe Taps  - 1 x daily - 7 x weekly - 3 sets - 10 reps - Seated Heel Raise  - 1 x daily - 7 x weekly - 3 sets - 10 reps - Seated Arch Lifts  - 1 x daily - 7 x weekly - 3 sets - 10 reps - Seated Ankle Alphabet  - 1 x daily - 7 x weekly - 1 sets - 1 reps - a-z hold - Seated Quad Set  - 1 x daily - 7 x weekly - 1 sets - 20 reps  ASSESSMENT:  CLINICAL IMPRESSION: Less dorsal foot edema compared to last visit.  Patient demonstrates good compliance with her HEP and has good awareness of surgical precautions;  She is on track with expectations for 8 weeks post op.  Her pain is well controlled.  Will plan to progress to standing bil heel raises with 25% body weight next week.  Therapist providing verbal cues to optimize technique with exercises in order to achieve the greatest benefit.       OBJECTIVE IMPAIRMENTS: Abnormal gait, decreased balance, difficulty walking, decreased ROM, decreased strength, increased fascial restrictions, increased muscle spasms, impaired flexibility, improper body mechanics, postural dysfunction, and pain.   ACTIVITY LIMITATIONS: carrying, lifting, bending, standing, squatting, sleeping, stairs, transfers,  bed mobility, bathing, toileting, dressing, and caring for others  PARTICIPATION LIMITATIONS: meal prep, cleaning, laundry, driving, shopping, community activity, occupation, and yard work  PERSONAL FACTORS: Age and Past/current experiences are also affecting patient's functional outcome.   REHAB POTENTIAL: Good  CLINICAL DECISION MAKING: Stable/uncomplicated  EVALUATION COMPLEXITY: Low   GOALS: Goals reviewed with patient? Yes  SHORT TERM GOALS: Target date: 01/07/2024  Pain report to be no greater than 4/10  Baseline: Goal status: met 12/3  2.  Patient will be independent with initial HEP  Baseline:  Goal status:met 12/3   LONG TERM GOALS: Target date: 02/04/2024  Patient to report pain no greater than 2/10  Baseline:  Goal status: INITIAL  2.  Patient to be independent with advanced HEP  Baseline:  Goal status: INITIAL  3.  Functional scores to improve by 2-3 seconds Baseline:  Goal status: INITIAL  4.  LEFS to improve to 66/80 or better Baseline:  Goal status: INITIAL  5.  Patient to be able to ambulate with normal heel to toe progression with no a.d. and no boot Baseline:  Goal status: INITIAL  6.  Patient to be able to ascend and descend steps without pain or no greater than 2/10  Baseline:  Goal status: INITIAL   PLAN:  PT FREQUENCY: 2x/week  PT DURATION: 8 weeks  PLANNED INTERVENTIONS: 97110-Therapeutic exercises, 97530- Therapeutic activity, [id number]- Neuromuscular re-education, 97535- Self Care, 02859- Manual therapy, [id number]- Gait training, [phone]- Canalith repositioning, [id number]- Aquatic Therapy, [phone]- Electrical stimulation (unattended), [id number]- Electrical stimulation (manual), [id number]- Vasopneumatic device, [id number]- Ultrasound, [id number]- Ionotophoresis 4mg /ml Dexamethasone, 79439 (1-2 muscles), 20561 (3+ muscles)- Dry Needling, Patient/Family education, Balance training,  Stair training, Taping, Joint mobilization, Scar mobilization, Vestibular training, DME  instructions, Cryotherapy, and Moist heat  PLAN FOR NEXT SESSION: Using Water Quality Scientist ([employer]) achilles repair protocol,  See blue sticky note for wedge removal schedule per surgeon.  Nustep, progress ankle ROM, gentle manual stretching avoiding DF past 90 degrees, gait training with boot and single axillary crutch; vasocompression; MD gave her the goal to be in a shoe on Christmas Day  Glade Pesa, PT 12/25/23 4:40 PM Phone: [phone] Fax: [phone]  [facility] [address], [address] [place], [place] [address] Phone # [phone] Fax [phone]

## 2023-12-26 ENCOUNTER — Ambulatory Visit: Admitting: Physical Therapy

## 2023-12-31 ENCOUNTER — Ambulatory Visit: Admitting: Physical Therapy

## 2023-12-31 ENCOUNTER — Encounter: Payer: Self-pay | Admitting: Physical Therapy

## 2023-12-31 DIAGNOSIS — M25572 Pain in left ankle and joints of left foot: Secondary | ICD-10-CM | POA: Diagnosis not present

## 2023-12-31 DIAGNOSIS — M6281 Muscle weakness (generalized): Secondary | ICD-10-CM

## 2023-12-31 DIAGNOSIS — R262 Difficulty in walking, not elsewhere classified: Secondary | ICD-10-CM

## 2023-12-31 DIAGNOSIS — M25672 Stiffness of left ankle, not elsewhere classified: Secondary | ICD-10-CM

## 2023-12-31 NOTE — Therapy (Signed)
 OUTPATIENT PHYSICAL THERAPY LOWER EXTREMITY PROGRESS NOTE   Patient Name: Kendra Norman MRN: [id number] DOB:07-15-1955, 68 y.o., female Today's Date: 12/31/2023  END OF SESSION:  PT End of Session - 12/31/23 1244     Visit Number 5    Date for Recertification  02/04/24    Authorization Type Medicare    Progress Note Due on Visit 10    PT Start Time 1143    PT Stop Time 1233    PT Time Calculation (min) 50 min    Activity Tolerance Patient tolerated treatment well    Behavior During Therapy WFL for tasks assessed/performed           Past Medical History:  Diagnosis Date   GERD (gastroesophageal reflux disease)    Seasonal allergies    Past Surgical History:  Procedure Laterality Date   ABDOMINAL HYSTERECTOMY     BREAST BIOPSY     BREAST EXCISIONAL BIOPSY Bilateral    fibroadenomas - benign   BREAST LUMPECTOMY Bilateral    fibroadenomas - benign   VEIN SURGERY     Patient Active Problem List   Diagnosis Date Noted   Strain of muscle, fascia and tendon of lower back, initial encounter 10/16/2018    PCP: Debrah Josette ORN., PA-C  REFERRING PROVIDER: Silva Juliene SAUNDERS, DPM  REFERRING DIAG: Left achilles rupture  THERAPY DIAG:  Pain in left ankle and joints of left foot  Stiffness of left ankle, not elsewhere classified  Muscle weakness (generalized)  Difficulty in walking, not elsewhere classified  Rationale for Evaluation and Treatment: Rehabilitation  ONSET DATE: 12/10/23  SUBJECTIVE:   SUBJECTIVE STATEMENT: Patient reports she has been doing a lot of errands today so her heel is a little sore.    Last visit: Saw the doctor yesterday and he said by Christmas day in a shoe.  He wants me to not go past 90 degrees.  Using a single crutch but it's awkward.  Took the 2nd layer of wedge out.       PERTINENT HISTORY: Right achilles rupture and repair  PAIN: 12/25/23 Are you having pain? Yes: NPRS scale: 0/10 Pain location: none Pain description:  none Aggravating factors: weight bearing Relieving factors: rest, ice  PRECAUTIONS: Fall  RED FLAGS: None   WEIGHT BEARING RESTRICTIONS: MD   FALLS:  Has patient fallen in last 6 months? No  LIVING ENVIRONMENT: Lives with: lives with their spouse Lives in: House/apartment Stairs: built a ramp and armed forces logistics/support/administrative officer is on same level Has following equipment at home: knee scooter  OCCUPATION: retired  PLOF: Independent, Independent with basic ADLs, Independent with household mobility without device, Independent with community mobility without device, Independent with homemaking with ambulation, Independent with gait, and Independent with transfers  PATIENT GOALS: to be able to return to her usual workouts and walk normally; return to golf  NEXT MD VISIT: 12/17/23  OBJECTIVE:  Note: Objective measures were completed at Evaluation unless otherwise noted.  DIAGNOSTIC FINDINGS: na  PATIENT SURVEYS:  LEFS  Extreme difficulty/unable (0), Quite a bit of difficulty (1), Moderate difficulty (2), Little difficulty (3), No difficulty (4) Survey date:  12/10/23  Score total:  20/80     COGNITION: Overall cognitive status: Within functional limits for tasks assessed     SENSATION: WFL   POSTURE: Bilateral knee valgus  PALPATION: na  LOWER EXTREMITY ROM:  Active ROM Right eval Left eval  Ankle dorsiflexion  90  Ankle plantarflexion  40  Ankle inversion  32  Ankle  eversion  10   (Blank rows = not tested)  LOWER EXTREMITY MMT: deferred due to acuity  MMT Right eval Left eval  Ankle dorsiflexion    Ankle plantarflexion    Ankle inversion    Ankle eversion     (Blank rows = not tested)  LOWER EXTREMITY SPECIAL TESTS:  Ankle special tests: na  FUNCTIONAL TESTS:  5 times sit to stand: 11.16 sec Timed up and go (TUG): 19.19 sec  GAIT: Distance walked: 30 FEET Assistive device utilized: knee scooter Level of assistance: Complete Independence Comments: enjoys  walking, golfing and working out  [employer]: 12 figure 8                                                                                                                              TREATMENT DATE:  12/31/23 Nustep x 6 min level 5 while discussing status  Manual therapy: PROM PF, Inv/Ev, toe ROM  Seated DF with towel x 10 (only to 90 degress) Seated short foot x 20 Towel scrunches with toes 10x Seated heel raise 2 x 10 Seated  green band resistance Inv and Ev 2 x 10 each,  Standing heel raise with 25% of weight through Lt 2 x 10 Lunge to BOSU  x 10 Lt with UE support with polls (forwards & sideways) Airex step up + high march x 10 bilateral  Standing on airex & opposite leg swing (forwards,sideways, back) x 8 each bilateral  Hurdles (forwards & sideways) x 3 laps each direction UE support as needed Vasocompression moderate compression, coldest setting 10 min with elevation for edema management   12/25/23 Nustep x 8 min level 5 while discussing status  Manual therapy: metatarsal mobs grade 2; PROM PF, Inv/Ev, toe ROM  Seated short foot x 20 Towel scrunches with toes 10x Seated LAQ x 20 with 7.5 lbs x 20 Seated  red band resistance DF, Inv and Ev x 10 each, plus 5 reps each 2nd set of each Standing green band hip extension and hip abduction 10x each right and left Side planks on knees and propped on elbow 12x left  Bridge with Les over green ball 2x10 HS curls in bridge over green ball 10x Vasocompression moderate compression, coldest setting 10 min with elevation for edema management  Discussion of expected progression next week  12/18/23 Nustep x 7 min level 4 Discussed use of her home bike with seat further back to avoid DF > 90 degrees Gait training options crutch vs cane pros and cons Seated short foot x 20 Towel scrunches with toes (very difficult secondary to dorsal foot edema) Instruction on Use of cold for edema management 5-10 min 3-4x/day Seated towel inversion/eversion  x 10 Seated LAQ x 20 with 5 lbs x 20 Seated hip flexion x 20 with 5 lbs x 20 Seated clam with green band  x 20 PROM PF, Inv/Ev, toe ROM  Metatarsal mobs grade 2 Seated yellow tband very light  resistance DF, Inv and Ev x 10 each Vasocompression moderate compression 10 min with elevation for edema management   12/12/23 Nustep x 5 min level 3 Gait training with axillary crutches x 50 feet working on step length and heel to toe progression Standing at barre: weight shift side to side x 20 in boot Standing at barre: weight shift fwd/back 1 set of 20 with right leg in front and 1 set with left leg in front in boot Seated toe taps and heel raises x 20 each Seated short foot x 20 Seated towel inversion/eversion x 10 Seated LAQ x 20 with 5 lbs x 20 Seated hip flexion x 20 with 5 lbs x 20 Seated clam with red loop x 20 PROM PF, Inv/Ev, toe ROM  Seated yellow tband very light resistance DF, Inv and Ev x 20 each Ankle alphabet A-Z Declined ice  12/10/23  Initial eval completed and initiated HEP   PATIENT EDUCATION:  Education details: Initiated HEP Person educated: Patient Education method: Programmer, Multimedia, Facilities Manager, Verbal cues, and Handouts Education comprehension: verbalized understanding, returned demonstration, and verbal cues required  HOME EXERCISE PROGRAM: Access Code: [email or web address] URL: [email or web address] Date: 12/10/2023 Prepared by: Delon Haddock  Exercises - Seated Toe Taps  - 1 x daily - 7 x weekly - 3 sets - 10 reps - Seated Heel Raise  - 1 x daily - 7 x weekly - 3 sets - 10 reps - Seated Arch Lifts  - 1 x daily - 7 x weekly - 3 sets - 10 reps - Seated Ankle Alphabet  - 1 x daily - 7 x weekly - 1 sets - 1 reps - a-z hold - Seated Quad Set  - 1 x daily - 7 x weekly - 1 sets - 20 reps  ASSESSMENT:  CLINICAL IMPRESSION: Patient presents complaining of increased soreness in her ankle after a lot of activity today. We were able to progress strengthening  exercises per protocol today. Patient was able to perform heel raises with 25% of her weight through involved extremity. Incorporated exercises on unstable surface for improved proprioception and stability. She removed a heel wedge yesterday from her boot. She verbalized feeling more steady. Overall, patient is progressing well with skilled therapy. Patient will benefit from skilled PT to address the below impairments and improve overall function.        OBJECTIVE IMPAIRMENTS: Abnormal gait, decreased balance, difficulty walking, decreased ROM, decreased strength, increased fascial restrictions, increased muscle spasms, impaired flexibility, improper body mechanics, postural dysfunction, and pain.   ACTIVITY LIMITATIONS: carrying, lifting, bending, standing, squatting, sleeping, stairs, transfers, bed mobility, bathing, toileting, dressing, and caring for others  PARTICIPATION LIMITATIONS: meal prep, cleaning, laundry, driving, shopping, community activity, occupation, and yard work  PERSONAL FACTORS: Age and Past/current experiences are also affecting patient's functional outcome.   REHAB POTENTIAL: Good  CLINICAL DECISION MAKING: Stable/uncomplicated  EVALUATION COMPLEXITY: Low   GOALS: Goals reviewed with patient? Yes  SHORT TERM GOALS: Target date: 01/07/2024  Pain report to be no greater than 4/10  Baseline: Goal status: met 12/3  2.  Patient will be independent with initial HEP  Baseline:  Goal status:met 12/3   LONG TERM GOALS: Target date: 02/04/2024  Patient to report pain no greater than 2/10  Baseline:  Goal status: INITIAL  2.  Patient to be independent with advanced HEP  Baseline:  Goal status: INITIAL  3.  Functional scores to improve by 2-3 seconds Baseline:  Goal status: INITIAL  4.  LEFS to improve  to 66/80 or better Baseline:  Goal status: INITIAL  5.  Patient to be able to ambulate with normal heel to toe progression with no a.d. and no  boot Baseline:  Goal status: INITIAL  6.  Patient to be able to ascend and descend steps without pain or no greater than 2/10  Baseline:  Goal status: INITIAL   PLAN:  PT FREQUENCY: 2x/week  PT DURATION: 8 weeks  PLANNED INTERVENTIONS: 97110-Therapeutic exercises, 97530- Therapeutic activity, 97112- Neuromuscular re-education, 97535- Self Care, 02859- Manual therapy, [phone]- Gait training, [phone]- Canalith repositioning, [id number]- Aquatic Therapy, [phone]- Electrical stimulation (unattended), [phone]- Electrical stimulation (manual), [id number]- Vasopneumatic device, [id number]- Ultrasound, [id number]- Ionotophoresis 4mg /ml Dexamethasone, 20560 (1-2 muscles), 20561 (3+ muscles)- Dry Needling, Patient/Family education, Balance training, Stair training, Taping, Joint mobilization, Scar mobilization, Vestibular training, DME instructions, Cryotherapy, and Moist heat  PLAN FOR NEXT SESSION: Using Water Quality Scientist ([employer]) achilles repair protocol,  See blue sticky note for wedge removal schedule per surgeon.  Nustep, progress ankle ROM, gentle manual stretching avoiding DF past 90 degrees, gait training with boot and single axillary crutch; vasocompression; MD gave her the goal to be in a shoe on Christmas Day   Kristeen Sar, PT, DPT 12/31/23 12:49 PM [facility] [address] [place], [place] [address] Phone # [phone] Fax [phone]

## 2024-01-02 ENCOUNTER — Encounter: Payer: Self-pay | Admitting: Physical Therapy

## 2024-01-02 ENCOUNTER — Ambulatory Visit: Admitting: Physical Therapy

## 2024-01-02 DIAGNOSIS — M25572 Pain in left ankle and joints of left foot: Secondary | ICD-10-CM | POA: Diagnosis not present

## 2024-01-02 DIAGNOSIS — M25672 Stiffness of left ankle, not elsewhere classified: Secondary | ICD-10-CM

## 2024-01-02 DIAGNOSIS — R262 Difficulty in walking, not elsewhere classified: Secondary | ICD-10-CM

## 2024-01-02 DIAGNOSIS — M6281 Muscle weakness (generalized): Secondary | ICD-10-CM

## 2024-01-02 NOTE — Therapy (Signed)
 OUTPATIENT PHYSICAL THERAPY LOWER EXTREMITY PROGRESS NOTE   Patient Name: Kendra Norman MRN: [id number] DOB:26-Jan-1955, 68 y.o., female Today's Date: 01/02/2024  END OF SESSION:  PT End of Session - 01/02/24 0846     Visit Number 6    Date for Recertification  02/04/24    Authorization Type Medicare    PT Start Time [phone]    PT Stop Time 0930    PT Time Calculation (min) 44 min    Activity Tolerance Patient tolerated treatment well           Past Medical History:  Diagnosis Date   GERD (gastroesophageal reflux disease)    Seasonal allergies    Past Surgical History:  Procedure Laterality Date   ABDOMINAL HYSTERECTOMY     BREAST BIOPSY     BREAST EXCISIONAL BIOPSY Bilateral    fibroadenomas - benign   BREAST LUMPECTOMY Bilateral    fibroadenomas - benign   VEIN SURGERY     Patient Active Problem List   Diagnosis Date Noted   Strain of muscle, fascia and tendon of lower back, initial encounter 10/16/2018    PCP: Debrah Josette ORN., PA-C  REFERRING PROVIDER: Silva Juliene SAUNDERS, DPM  REFERRING DIAG: Left achilles rupture  THERAPY DIAG:  Pain in left ankle and joints of left foot  Stiffness of left ankle, not elsewhere classified  Muscle weakness (generalized)  Difficulty in walking, not elsewhere classified  Rationale for Evaluation and Treatment: Rehabilitation  ONSET DATE: 12/10/23  SUBJECTIVE:   SUBJECTIVE STATEMENT: Patient reports some heel pain from cummulative effect of lots of activities standing to wrap presents on top of therapy session; no crutch used today     PERTINENT HISTORY: Right achilles rupture and repair  PAIN: 12/25/23 Are you having pain? Yes: NPRS scale: 0/10 Pain location: none Pain description: none Aggravating factors: weight bearing Relieving factors: rest, ice  PRECAUTIONS: Fall  RED FLAGS: None   WEIGHT BEARING RESTRICTIONS: MD   FALLS:  Has patient fallen in last 6 months? No  LIVING ENVIRONMENT: Lives  with: lives with their spouse Lives in: House/apartment Stairs: built a ramp and armed forces logistics/support/administrative officer is on same level Has following equipment at home: knee scooter  OCCUPATION: retired  PLOF: Independent, Independent with basic ADLs, Independent with household mobility without device, Independent with community mobility without device, Independent with homemaking with ambulation, Independent with gait, and Independent with transfers  PATIENT GOALS: to be able to return to her usual workouts and walk normally; return to golf  NEXT MD VISIT: 12/17/23  OBJECTIVE:  Note: Objective measures were completed at Evaluation unless otherwise noted.  DIAGNOSTIC FINDINGS: na  PATIENT SURVEYS:  LEFS  Extreme difficulty/unable (0), Quite a bit of difficulty (1), Moderate difficulty (2), Little difficulty (3), No difficulty (4) Survey date:  12/10/23  Score total:  20/80     COGNITION: Overall cognitive status: Within functional limits for tasks assessed     SENSATION: WFL   POSTURE: Bilateral knee valgus  PALPATION: na  LOWER EXTREMITY ROM:  Active ROM Right eval Left eval  Ankle dorsiflexion  90  Ankle plantarflexion  40  Ankle inversion  32  Ankle eversion  10   (Blank rows = not tested)  LOWER EXTREMITY MMT: deferred due to acuity  MMT Right eval Left eval  Ankle dorsiflexion    Ankle plantarflexion    Ankle inversion    Ankle eversion     (Blank rows = not tested)  LOWER EXTREMITY SPECIAL TESTS:  Ankle  special tests: na  FUNCTIONAL TESTS:  5 times sit to stand: 11.16 sec Timed up and go (TUG): 19.19 sec  GAIT: Distance walked: 30 FEET Assistive device utilized: knee scooter Level of assistance: Complete Independence Comments: enjoys walking, golfing and working out  [employer]: 12 figure 8                                                                                                                              TREATMENT DATE:  01/02/24  Manual therapy: PROM  PF, Inv/Ev, toe ROM; gentle soft tissue work to cablevision systems in prone Seated heel raise 2 x 10 Standing  (no boot) heel raise with 25% of weight through Lt 2 x 10 Standing (no boot) with folded towel as heel wedge: right 3 ways 10x; weight shifting side to side 10x; SLS 3 attempts Hurdles with boot on forwards & sideways) x 3 laps each direction UE support as needed Vasocompression moderate compression, coldest setting 10 min with elevation for edema management  12/31/23 Nustep x 6 min level 5 while discussing status  Manual therapy: PROM PF, Inv/Ev, toe ROM  Seated DF with towel x 10 (only to 90 degress) Seated short foot x 20 Towel scrunches with toes 10x Seated heel raise 2 x 10 Seated  green band resistance Inv and Ev 2 x 10 each,  Standing heel raise with 25% of weight through Lt 2 x 10 Lunge to BOSU  x 10 Lt with UE support with polls (forwards & sideways) Airex step up + high march x 10 bilateral  Standing on airex & opposite leg swing (forwards,sideways, back) x 8 each bilateral  Hurdles (forwards & sideways) x 3 laps each direction UE support as needed Vasocompression moderate compression, coldest setting 10 min with elevation for edema management   12/25/23 Nustep x 8 min level 5 while discussing status  Manual therapy: metatarsal mobs grade 2; PROM PF, Inv/Ev, toe ROM  Seated short foot x 20 Towel scrunches with toes 10x Seated LAQ x 20 with 7.5 lbs x 20 Seated  red band resistance DF, Inv and Ev x 10 each, plus 5 reps each 2nd set of each Standing green band hip extension and hip abduction 10x each right and left Side planks on knees and propped on elbow 12x left  Bridge with Les over green ball 2x10 HS curls in bridge over green ball 10x Vasocompression moderate compression, coldest setting 10 min with elevation for edema management  Discussion of expected progression next week  12/18/23 Nustep x 7 min level 4 Discussed use of her home bike with seat further back to avoid  DF > 90 degrees Gait training options crutch vs cane pros and cons Seated short foot x 20 Towel scrunches with toes (very difficult secondary to dorsal foot edema) Instruction on Use of cold for edema management 5-10 min 3-4x/day Seated towel inversion/eversion x 10 Seated LAQ x 20 with 5 lbs x 20 Seated hip  flexion x 20 with 5 lbs x 20 Seated clam with green band  x 20 PROM PF, Inv/Ev, toe ROM  Metatarsal mobs grade 2 Seated yellow tband very light resistance DF, Inv and Ev x 10 each Vasocompression moderate compression 10 min with elevation for edema management   12/12/23 Nustep x 5 min level 3 Gait training with axillary crutches x 50 feet working on step length and heel to toe progression Standing at barre: weight shift side to side x 20 in boot Standing at barre: weight shift fwd/back 1 set of 20 with right leg in front and 1 set with left leg in front in boot Seated toe taps and heel raises x 20 each Seated short foot x 20 Seated towel inversion/eversion x 10 Seated LAQ x 20 with 5 lbs x 20 Seated hip flexion x 20 with 5 lbs x 20 Seated clam with red loop x 20 PROM PF, Inv/Ev, toe ROM  Seated yellow tband very light resistance DF, Inv and Ev x 20 each Ankle alphabet A-Z Declined ice  12/10/23  Initial eval completed and initiated HEP   PATIENT EDUCATION:  Education details: Initiated HEP Person educated: Patient Education method: Programmer, Multimedia, Facilities Manager, Verbal cues, and Handouts Education comprehension: verbalized understanding, returned demonstration, and verbal cues required  HOME EXERCISE PROGRAM: Access Code: [email or web address] URL: [email or web address] Date: 12/10/2023 Prepared by: Delon Haddock  Exercises - Seated Toe Taps  - 1 x daily - 7 x weekly - 3 sets - 10 reps - Seated Heel Raise  - 1 x daily - 7 x weekly - 3 sets - 10 reps - Seated Arch Lifts  - 1 x daily - 7 x weekly - 3 sets - 10 reps - Seated Ankle Alphabet  - 1 x daily - 7 x weekly  - 1 sets - 1 reps - a-z hold - Seated Quad Set  - 1 x daily - 7 x weekly - 1 sets - 20 reps  ASSESSMENT:  CLINICAL IMPRESSION: Briella is progressing well ankle/foot ROM with avoidance of over-elongation of the Achilles.  Calf raise progression has been initiated with 25% body weight through involved leg.  Low level balance/proprioception performed in standing with a towel roll under heel to avoid dorsiflexion beyond 90 degrees. Therapist providing verbal cues to optimize technique with exercises in order to achieve the greatest benefit. Close supervision with hurdles with one loss of balance requiring therapist assist to stabilize (lateral direction).           OBJECTIVE IMPAIRMENTS: Abnormal gait, decreased balance, difficulty walking, decreased ROM, decreased strength, increased fascial restrictions, increased muscle spasms, impaired flexibility, improper body mechanics, postural dysfunction, and pain.   ACTIVITY LIMITATIONS: carrying, lifting, bending, standing, squatting, sleeping, stairs, transfers, bed mobility, bathing, toileting, dressing, and caring for others  PARTICIPATION LIMITATIONS: meal prep, cleaning, laundry, driving, shopping, community activity, occupation, and yard work  PERSONAL FACTORS: Age and Past/current experiences are also affecting patient's functional outcome.   REHAB POTENTIAL: Good  CLINICAL DECISION MAKING: Stable/uncomplicated  EVALUATION COMPLEXITY: Low   GOALS: Goals reviewed with patient? Yes  SHORT TERM GOALS: Target date: 01/07/2024  Pain report to be no greater than 4/10  Baseline: Goal status: met 12/3  2.  Patient will be independent with initial HEP  Baseline:  Goal status:met 12/3   LONG TERM GOALS: Target date: 02/04/2024  Patient to report pain no greater than 2/10  Baseline:  Goal status: INITIAL  2.  Patient to be independent with advanced HEP  Baseline:  Goal status: INITIAL  3.  Functional scores to improve by 2-3  seconds Baseline:  Goal status: INITIAL  4.  LEFS to improve to 66/80 or better Baseline:  Goal status: INITIAL  5.  Patient to be able to ambulate with normal heel to toe progression with no a.d. and no boot Baseline:  Goal status: INITIAL  6.  Patient to be able to ascend and descend steps without pain or no greater than 2/10  Baseline:  Goal status: INITIAL   PLAN:  PT FREQUENCY: 2x/week  PT DURATION: 8 weeks  PLANNED INTERVENTIONS: 97110-Therapeutic exercises, 97530- Therapeutic activity, 97112- Neuromuscular re-education, 97535- Self Care, 02859- Manual therapy, [id number]- Gait training, [phone]- Canalith repositioning, [id number]- Aquatic Therapy, [phone]- Electrical stimulation (unattended), [phone]- Electrical stimulation (manual), [id number]- Vasopneumatic device, [id number]- Ultrasound, [id number]- Ionotophoresis 4mg /ml Dexamethasone, 79439 (1-2 muscles), 20561 (3+ muscles)- Dry Needling, Patient/Family education, Balance training, Stair training, Taping, Joint mobilization, Scar mobilization, Vestibular training, DME instructions, Cryotherapy, and Moist heat  PLAN FOR NEXT SESSION: Using Water Quality Scientist ([employer]) achilles repair protocol,  See blue sticky note for wedge removal schedule per surgeon.  Nustep, progress ankle ROM, gentle manual stretching avoiding DF past 90 degrees, gait training with boot and single axillary crutch; vasocompression; MD gave her the goal to be in a shoe on Christmas Day   Glade Pesa, PT 01/02/2024 5:22 PM Phone: [phone] Fax: [phone] [facility] [address], [address] [place], [place] [address] Phone # [phone] Fax [phone]

## 2024-01-07 ENCOUNTER — Ambulatory Visit

## 2024-01-07 DIAGNOSIS — R262 Difficulty in walking, not elsewhere classified: Secondary | ICD-10-CM

## 2024-01-07 DIAGNOSIS — M25672 Stiffness of left ankle, not elsewhere classified: Secondary | ICD-10-CM

## 2024-01-07 DIAGNOSIS — M25572 Pain in left ankle and joints of left foot: Secondary | ICD-10-CM | POA: Diagnosis not present

## 2024-01-07 DIAGNOSIS — M6281 Muscle weakness (generalized): Secondary | ICD-10-CM

## 2024-01-07 DIAGNOSIS — R293 Abnormal posture: Secondary | ICD-10-CM

## 2024-01-07 DIAGNOSIS — R252 Cramp and spasm: Secondary | ICD-10-CM

## 2024-01-07 NOTE — Therapy (Signed)
 OUTPATIENT PHYSICAL THERAPY LOWER EXTREMITY TREATMENT NOTE   Patient Name: Kendra Norman MRN: [id number] DOB:02-10-1955, 68 y.o., female Today's Date: 01/07/2024  END OF SESSION:  PT End of Session - 01/07/24 1011     Visit Number 7    Date for Recertification  02/04/24    Authorization Type Medicare    Progress Note Due on Visit 10    PT Start Time 1013    PT Stop Time 1109    PT Time Calculation (min) 56 min    Activity Tolerance Patient tolerated treatment well    Behavior During Therapy WFL for tasks assessed/performed           Past Medical History:  Diagnosis Date   GERD (gastroesophageal reflux disease)    Seasonal allergies    Past Surgical History:  Procedure Laterality Date   ABDOMINAL HYSTERECTOMY     BREAST BIOPSY     BREAST EXCISIONAL BIOPSY Bilateral    fibroadenomas - benign   BREAST LUMPECTOMY Bilateral    fibroadenomas - benign   VEIN SURGERY     Patient Active Problem List   Diagnosis Date Noted   Strain of muscle, fascia and tendon of lower back, initial encounter 10/16/2018    PCP: Debrah Josette ORN., PA-C  REFERRING PROVIDER: Silva Juliene SAUNDERS, DPM  REFERRING DIAG: Left achilles rupture  THERAPY DIAG:  Pain in left ankle and joints of left foot  Stiffness of left ankle, not elsewhere classified  Muscle weakness (generalized)  Cramp and spasm  Difficulty in walking, not elsewhere classified  Abnormal posture  Rationale for Evaluation and Treatment: Rehabilitation  ONSET DATE: 12/10/23  SUBJECTIVE:   SUBJECTIVE STATEMENT: 9 weeks post op.  Still in boot but all lifts have been removed.  She was gifted new shoes and is ready to work on transitioning into shoes by Christmas.  Patient reports no pain since last visit.       PERTINENT HISTORY: Right achilles rupture and repair  PAIN: 01/07/24 Are you having pain? Yes: NPRS scale: 0/10 Pain location: none Pain description: none Aggravating factors: weight  bearing Relieving factors: rest, ice  PRECAUTIONS: Fall  RED FLAGS: None   WEIGHT BEARING RESTRICTIONS: MD   FALLS:  Has patient fallen in last 6 months? No  LIVING ENVIRONMENT: Lives with: lives with their spouse Lives in: House/apartment Stairs: built a ramp and armed forces logistics/support/administrative officer is on same level Has following equipment at home: knee scooter  OCCUPATION: retired  PLOF: Independent, Independent with basic ADLs, Independent with household mobility without device, Independent with community mobility without device, Independent with homemaking with ambulation, Independent with gait, and Independent with transfers  PATIENT GOALS: to be able to return to her usual workouts and walk normally; return to golf  NEXT MD VISIT: 12/17/23 (surgery 11/04/23)  OBJECTIVE:  Note: Objective measures were completed at Evaluation unless otherwise noted.  DIAGNOSTIC FINDINGS: na  PATIENT SURVEYS:  LEFS  Extreme difficulty/unable (0), Quite a bit of difficulty (1), Moderate difficulty (2), Little difficulty (3), No difficulty (4) Survey date:  12/10/23  Score total:  20/80     COGNITION: Overall cognitive status: Within functional limits for tasks assessed     SENSATION: WFL   POSTURE: Bilateral knee valgus  PALPATION: na  LOWER EXTREMITY ROM:  Active ROM Right eval Left eval  Ankle dorsiflexion  90  Ankle plantarflexion  40  Ankle inversion  32  Ankle eversion  10   (Blank rows = not tested)  LOWER EXTREMITY MMT:  deferred due to acuity  MMT Right eval Left eval  Ankle dorsiflexion    Ankle plantarflexion    Ankle inversion    Ankle eversion     (Blank rows = not tested)  LOWER EXTREMITY SPECIAL TESTS:  Ankle special tests: na  FUNCTIONAL TESTS:  5 times sit to stand: 11.16 sec Timed up and go (TUG): 19.19 sec  GAIT: Distance walked: 30 FEET Assistive device utilized: knee scooter Level of assistance: Complete Independence Comments: enjoys walking, golfing  and working out  [employer]: 12 figure 8                                                                                                                              TREATMENT DATE:  01/07/24 (9 week protocol begins) Manual therapy: PROM PF, Inv/Ev, toe ROM; gentle soft tissue work to achilles and gastroc Seated towel toe scrunches x 20 Seated towel inv/ev x 20 On 2 inch step: ankle DF stretch (gently) x 10 holding 10 sec On balance pad: weight shift laterals x 20 then fwd back x 20 SLS at back counter x 5 attempting to hold 10 sec Seated heel raise 2 x 10 Seated heel slides into slight DF x 10 Standing  (no boot) heel raise with 25-50% of weight through Lt 2 x 10 Hurdles with boot on (forwards & sideways) x 5 laps each direction UE support as needed Vasocompression moderate compression, coldest setting 10 min with elevation for edema management  01/02/24  Manual therapy: PROM PF, Inv/Ev, toe ROM; gentle soft tissue work to cablevision systems in prone Seated heel raise 2 x 10 Standing  (no boot) heel raise with 25% of weight through Lt 2 x 10 Standing (no boot) with folded towel as heel wedge: right 3 ways 10x; weight shifting side to side 10x; SLS 3 attempts Hurdles with boot on forwards & sideways) x 3 laps each direction UE support as needed Vasocompression moderate compression, coldest setting 10 min with elevation for edema management  12/31/23 Nustep x 6 min level 5 while discussing status  Manual therapy: PROM PF, Inv/Ev, toe ROM  Seated DF with towel x 10 (only to 90 degress) Seated short foot x 20 Towel scrunches with toes 10x Seated heel raise 2 x 10 Seated  green band resistance Inv and Ev 2 x 10 each,  Standing heel raise with 25% of weight through Lt 2 x 10 Lunge to BOSU  x 10 Lt with UE support with polls (forwards & sideways) Airex step up + high march x 10 bilateral  Standing on airex & opposite leg swing (forwards,sideways, back) x 8 each bilateral  Hurdles (forwards &  sideways) x 3 laps each direction UE support as needed Vasocompression moderate compression, coldest setting 10 min with elevation for edema management   12/10/23  Initial eval completed and initiated HEP   PATIENT EDUCATION:  Education details: Initiated HEP Person educated: Patient Education method: Programmer, Multimedia, Facilities Manager, Verbal cues,  and Handouts Education comprehension: verbalized understanding, returned demonstration, and verbal cues required  HOME EXERCISE PROGRAM: Access Code: [email or web address] URL: [email or web address] Date: 12/10/2023 Prepared by: Delon Haddock  Exercises - Seated Toe Taps  - 1 x daily - 7 x weekly - 3 sets - 10 reps - Seated Heel Raise  - 1 x daily - 7 x weekly - 3 sets - 10 reps - Seated Arch Lifts  - 1 x daily - 7 x weekly - 3 sets - 10 reps - Seated Ankle Alphabet  - 1 x daily - 7 x weekly - 1 sets - 1 reps - a-z hold - Seated Quad Set  - 1 x daily - 7 x weekly - 1 sets - 20 reps  ASSESSMENT:  CLINICAL IMPRESSION: Aide is now 9 weeks post op.  We added DF stretch on step but with caution to proceed slowly.  She is able to do SLS with no lob several reps for 10 sec hold.  Calf raise progression continued with 25-50% body weight through involved leg.   Stand by supervision with hurdles with no loss of balance today.  She is well motivated and compliant.  She should continue to do well.       OBJECTIVE IMPAIRMENTS: Abnormal gait, decreased balance, difficulty walking, decreased ROM, decreased strength, increased fascial restrictions, increased muscle spasms, impaired flexibility, improper body mechanics, postural dysfunction, and pain.   ACTIVITY LIMITATIONS: carrying, lifting, bending, standing, squatting, sleeping, stairs, transfers, bed mobility, bathing, toileting, dressing, and caring for others  PARTICIPATION LIMITATIONS: meal prep, cleaning, laundry, driving, shopping, community activity, occupation, and yard work  PERSONAL  FACTORS: Age and Past/current experiences are also affecting patient's functional outcome.   REHAB POTENTIAL: Good  CLINICAL DECISION MAKING: Stable/uncomplicated  EVALUATION COMPLEXITY: Low   GOALS: Goals reviewed with patient? Yes  SHORT TERM GOALS: Target date: 01/07/2024  Pain report to be no greater than 4/10  Baseline: Goal status: met 12/3  2.  Patient will be independent with initial HEP  Baseline:  Goal status:met 12/3   LONG TERM GOALS: Target date: 02/04/2024  Patient to report pain no greater than 2/10  Baseline:  Goal status: INITIAL  2.  Patient to be independent with advanced HEP  Baseline:  Goal status: INITIAL  3.  Functional scores to improve by 2-3 seconds Baseline:  Goal status: INITIAL  4.  LEFS to improve to 66/80 or better Baseline:  Goal status: INITIAL  5.  Patient to be able to ambulate with normal heel to toe progression with no a.d. and no boot Baseline:  Goal status: INITIAL  6.  Patient to be able to ascend and descend steps without pain or no greater than 2/10  Baseline:  Goal status: INITIAL   PLAN:  PT FREQUENCY: 2x/week  PT DURATION: 8 weeks  PLANNED INTERVENTIONS: 97110-Therapeutic exercises, 97530- Therapeutic activity, 97112- Neuromuscular re-education, 97535- Self Care, [id number]- Manual therapy, [phone]- Gait training, [phone]- Canalith repositioning, [id number]- Aquatic Therapy, [phone]- Electrical stimulation (unattended), [phone]- Electrical stimulation (manual), [id number]- Vasopneumatic device, [id number]- Ultrasound, [id number]- Ionotophoresis 4mg /ml Dexamethasone, [id number] (1-2 muscles), [id number] (3+ muscles)- Dry Needling, Patient/Family education, Balance training, Stair training, Taping, Joint mobilization, Scar mobilization, Vestibular training, DME instructions, Cryotherapy, and Moist heat  PLAN FOR NEXT SESSION: Using Water Quality Scientist ([employer]) achilles repair protocol,  all wedges have been removed.  Use one thin wedge or patients existing  heel cup as she begins gait training in shoe, which she will bring next visit.    Nustep,  progress ankle ROM, gentle manual stretching avoiding DF past 90 degrees, gait training with boot and single axillary crutch; vasocompression; Goal is to be in a shoe on Christmas Day   Sequoyah Counterman B. Akaysha Cobern, PT 01/07/2024 11:02 AM [facility] [address] [place], [place] [address] Phone # [phone] Fax [phone]

## 2024-01-09 ENCOUNTER — Ambulatory Visit

## 2024-01-09 DIAGNOSIS — R293 Abnormal posture: Secondary | ICD-10-CM

## 2024-01-09 DIAGNOSIS — M25672 Stiffness of left ankle, not elsewhere classified: Secondary | ICD-10-CM

## 2024-01-09 DIAGNOSIS — R252 Cramp and spasm: Secondary | ICD-10-CM

## 2024-01-09 DIAGNOSIS — M25572 Pain in left ankle and joints of left foot: Secondary | ICD-10-CM

## 2024-01-09 DIAGNOSIS — M6281 Muscle weakness (generalized): Secondary | ICD-10-CM

## 2024-01-09 DIAGNOSIS — R262 Difficulty in walking, not elsewhere classified: Secondary | ICD-10-CM

## 2024-01-09 NOTE — Therapy (Signed)
 OUTPATIENT PHYSICAL THERAPY LOWER EXTREMITY TREATMENT NOTE   Patient Name: Kendra Norman MRN: [id number] DOB:1955/11/29, 68 y.o., female Today's Date: 01/09/2024  END OF SESSION:  PT End of Session - 01/09/24 1020     Visit Number 8    Date for Recertification  02/04/24    Authorization Type Medicare    Progress Note Due on Visit 10    PT Start Time 1015    PT Stop Time 1110    PT Time Calculation (min) 55 min    Activity Tolerance Patient tolerated treatment well    Behavior During Therapy WFL for tasks assessed/performed           Past Medical History:  Diagnosis Date   GERD (gastroesophageal reflux disease)    Seasonal allergies    Past Surgical History:  Procedure Laterality Date   ABDOMINAL HYSTERECTOMY     BREAST BIOPSY     BREAST EXCISIONAL BIOPSY Bilateral    fibroadenomas - benign   BREAST LUMPECTOMY Bilateral    fibroadenomas - benign   VEIN SURGERY     Patient Active Problem List   Diagnosis Date Noted   Strain of muscle, fascia and tendon of lower back, initial encounter 10/16/2018    PCP: Debrah Josette ORN., PA-C  REFERRING PROVIDER: Silva Juliene SAUNDERS, DPM  REFERRING DIAG: Left achilles rupture  THERAPY DIAG:  Pain in left ankle and joints of left foot  Stiffness of left ankle, not elsewhere classified  Muscle weakness (generalized)  Cramp and spasm  Difficulty in walking, not elsewhere classified  Abnormal posture  Rationale for Evaluation and Treatment: Rehabilitation  ONSET DATE: 12/10/23  SUBJECTIVE:   SUBJECTIVE STATEMENT: Patient reports she is doing well.  She has little to no pain and doing well with her HEP.   She arrives with shoe to practice gait in shoe.      PERTINENT HISTORY: Right achilles rupture and repair  PAIN: 01/09/24 Are you having pain? Yes: NPRS scale: 0/10 Pain location: none Pain description: none Aggravating factors: weight bearing Relieving factors: rest, ice  PRECAUTIONS: Fall  RED  FLAGS: None   WEIGHT BEARING RESTRICTIONS: MD   FALLS:  Has patient fallen in last 6 months? No  LIVING ENVIRONMENT: Lives with: lives with their spouse Lives in: House/apartment Stairs: built a ramp and armed forces logistics/support/administrative officer is on same level Has following equipment at home: knee scooter  OCCUPATION: retired  PLOF: Independent, Independent with basic ADLs, Independent with household mobility without device, Independent with community mobility without device, Independent with homemaking with ambulation, Independent with gait, and Independent with transfers  PATIENT GOALS: to be able to return to her usual workouts and walk normally; return to golf  NEXT MD VISIT: 12/17/23 (surgery 11/04/23)  OBJECTIVE:  Note: Objective measures were completed at Evaluation unless otherwise noted.  DIAGNOSTIC FINDINGS: na  PATIENT SURVEYS:  LEFS  Extreme difficulty/unable (0), Quite a bit of difficulty (1), Moderate difficulty (2), Little difficulty (3), No difficulty (4) Survey date:  12/10/23  Score total:  20/80     COGNITION: Overall cognitive status: Within functional limits for tasks assessed     SENSATION: WFL   POSTURE: Bilateral knee valgus  PALPATION: na  LOWER EXTREMITY ROM:  Active ROM Right eval Left eval  Ankle dorsiflexion  90  Ankle plantarflexion  40  Ankle inversion  32  Ankle eversion  10   (Blank rows = not tested)  LOWER EXTREMITY MMT: deferred due to acuity  MMT Right eval Left eval  Ankle dorsiflexion    Ankle plantarflexion    Ankle inversion    Ankle eversion     (Blank rows = not tested)  LOWER EXTREMITY SPECIAL TESTS:  Ankle special tests: na  FUNCTIONAL TESTS:  5 times sit to stand: 11.16 sec Timed up and go (TUG): 19.19 sec  GAIT: Distance walked: 30 FEET Assistive device utilized: knee scooter Level of assistance: Complete Independence Comments: enjoys walking, golfing and working out  [employer]: 12 figure 8                                                                                                                               TREATMENT DATE:  01/09/24 (9 weeks post op) Nustep x 5 min level 5 in boot (PT present to discuss status) Seated toe taps left x 20 Seated heel raises left x 20 Short foot x 20 Seated plantar fascia rolling to relieve heel pain x 2 min Seated towel inv/ev x 20 (10 each side) Donned normal shoe on left On 2 inch step: ankle DF stretch (gently) x 10 holding 10 sec On balance pad: weight shift laterals x 20 then fwd back x 20 Step up and hold left on balance pad, using balance pole on right x 10 fwd then x 10 lateral SLS at barre x 5 attempting to hold 10 sec (left) Instructed patient to communicate if any irritation feeling with new exercises so that healing tissue does not get weakk Standing  (no boot) heel raise with 25-50% of weight through Lt 2 x 10 Single hurdles without boot (forwards & sideways) x 10 each direction UE support as needed Vasocompression moderate compression, coldest setting 10 min with elevation for edema management  01/07/24 (9 week protocol begins) Manual therapy: PROM PF, Inv/Ev, toe ROM; gentle soft tissue work to achilles and gastroc Seated towel toe scrunches x 20 Seated towel inv/ev x 20 On 2 inch step: ankle DF stretch (gently) x 10 holding 10 sec On balance pad: weight shift laterals x 20 then fwd back x 20 SLS at back counter x 5 attempting to hold 10 sec Seated heel raise 2 x 10 Seated heel slides into slight DF x 10 Standing  (no boot) heel raise with 25-50% of weight through Lt 2 x 10 Hurdles with boot on (forwards & sideways) x 5 laps each direction UE support as needed Vasocompression moderate compression, coldest setting 10 min with elevation for edema management  01/02/24  Manual therapy: PROM PF, Inv/Ev, toe ROM; gentle soft tissue work to cablevision systems in prone Seated heel raise 2 x 10 Standing  (no boot) heel raise with 25% of weight through Lt 2 x  10 Standing (no boot) with folded towel as heel wedge: right 3 ways 10x; weight shifting side to side 10x; SLS 3 attempts Hurdles with boot on forwards & sideways) x 3 laps each direction UE support as needed Vasocompression moderate compression, coldest setting 10 min with elevation  for edema management  12/10/23  Initial eval completed and initiated HEP   PATIENT EDUCATION:  Education details: Initiated HEP Person educated: Patient Education method: Programmer, Multimedia, Facilities Manager, Verbal cues, and Handouts Education comprehension: verbalized understanding, returned demonstration, and verbal cues required  HOME EXERCISE PROGRAM: Access Code: [email or web address] URL: [email or web address] Date: 12/10/2023 Prepared by: Delon Haddock  Exercises - Seated Toe Taps  - 1 x daily - 7 x weekly - 3 sets - 10 reps - Seated Heel Raise  - 1 x daily - 7 x weekly - 3 sets - 10 reps - Seated Arch Lifts  - 1 x daily - 7 x weekly - 3 sets - 10 reps - Seated Ankle Alphabet  - 1 x daily - 7 x weekly - 1 sets - 1 reps - a-z hold - Seated Quad Set  - 1 x daily - 7 x weekly - 1 sets - 20 reps  ASSESSMENT:  CLINICAL IMPRESSION: Araina did extremely well with activities out of the boot.  She has minimal to no pain today with all activities.  Gait is near normal, only slightly short step length.  She needs minimal UE support with balance activities.  She is progressing ahead of schedule.  She is well motivated and compliant.  She should continue to do well.   She would benefit from continuing skilled PT to progress toward goals below.      OBJECTIVE IMPAIRMENTS: Abnormal gait, decreased balance, difficulty walking, decreased ROM, decreased strength, increased fascial restrictions, increased muscle spasms, impaired flexibility, improper body mechanics, postural dysfunction, and pain.   ACTIVITY LIMITATIONS: carrying, lifting, bending, standing, squatting, sleeping, stairs, transfers, bed mobility, bathing,  toileting, dressing, and caring for others  PARTICIPATION LIMITATIONS: meal prep, cleaning, laundry, driving, shopping, community activity, occupation, and yard work  PERSONAL FACTORS: Age and Past/current experiences are also affecting patient's functional outcome.   REHAB POTENTIAL: Good  CLINICAL DECISION MAKING: Stable/uncomplicated  EVALUATION COMPLEXITY: Low   GOALS: Goals reviewed with patient? Yes  SHORT TERM GOALS: Target date: 01/07/2024  Pain report to be no greater than 4/10  Baseline: Goal status: met 12/3  2.  Patient will be independent with initial HEP  Baseline:  Goal status:met 12/3   LONG TERM GOALS: Target date: 02/04/2024  Patient to report pain no greater than 2/10  Baseline:  Goal status: INITIAL  2.  Patient to be independent with advanced HEP  Baseline:  Goal status: INITIAL  3.  Functional scores to improve by 2-3 seconds Baseline:  Goal status: INITIAL  4.  LEFS to improve to 66/80 or better Baseline:  Goal status: INITIAL  5.  Patient to be able to ambulate with normal heel to toe progression with no a.d. and no boot Baseline:  Goal status: INITIAL  6.  Patient to be able to ascend and descend steps without pain or no greater than 2/10  Baseline:  Goal status: INITIAL   PLAN:  PT FREQUENCY: 2x/week  PT DURATION: 8 weeks  PLANNED INTERVENTIONS: 97110-Therapeutic exercises, 97530- Therapeutic activity, 97112- Neuromuscular re-education, 97535- Self Care, 02859- Manual therapy, [phone]- Gait training, [phone]- Canalith repositioning, [id number]- Aquatic Therapy, [phone]- Electrical stimulation (unattended), [phone]- Electrical stimulation (manual), [id number]- Vasopneumatic device, [id number]- Ultrasound, [id number]- Ionotophoresis 4mg /ml Dexamethasone, 20560 (1-2 muscles), 20561 (3+ muscles)- Dry Needling, Patient/Family education, Balance training, Stair training, Taping, Joint mobilization, Scar mobilization, Vestibular training, DME instructions,  Cryotherapy, and Moist heat  PLAN FOR NEXT SESSION: Using Water Quality Scientist ([employer]) achilles repair protocol,  all wedges have  been removed.  Patient is transitioning into normal shoe.     Nustep, progress ankle ROM, gait training with shoe; vasocompression; Goal is to be in a shoe on Christmas Day   Delon B. Lindsy Cerullo, PT 01/09/2024 4:24 PM [facility] [address], [address] [place], [place] [address] Phone # [phone] Fax [phone]

## 2024-01-14 ENCOUNTER — Ambulatory Visit

## 2024-01-14 DIAGNOSIS — R262 Difficulty in walking, not elsewhere classified: Secondary | ICD-10-CM

## 2024-01-14 DIAGNOSIS — R293 Abnormal posture: Secondary | ICD-10-CM

## 2024-01-14 DIAGNOSIS — M25572 Pain in left ankle and joints of left foot: Secondary | ICD-10-CM

## 2024-01-14 DIAGNOSIS — R252 Cramp and spasm: Secondary | ICD-10-CM

## 2024-01-14 DIAGNOSIS — M6281 Muscle weakness (generalized): Secondary | ICD-10-CM

## 2024-01-14 DIAGNOSIS — M25672 Stiffness of left ankle, not elsewhere classified: Secondary | ICD-10-CM

## 2024-01-14 NOTE — Therapy (Signed)
 " OUTPATIENT PHYSICAL THERAPY LOWER EXTREMITY TREATMENT NOTE   Patient Name: Kendra Norman MRN: [id number] DOB:July 06, 1955, 67 y.o., female Today's Date: 01/14/2024  END OF SESSION:  PT End of Session - 01/14/24 1025     Visit Number 9    Date for Recertification  02/04/24    Authorization Type Medicare    Progress Note Due on Visit 10    PT Start Time 1015    PT Stop Time 1109    PT Time Calculation (min) 54 min    Activity Tolerance Patient tolerated treatment well    Behavior During Therapy WFL for tasks assessed/performed           Past Medical History:  Diagnosis Date   GERD (gastroesophageal reflux disease)    Seasonal allergies    Past Surgical History:  Procedure Laterality Date   ABDOMINAL HYSTERECTOMY     BREAST BIOPSY     BREAST EXCISIONAL BIOPSY Bilateral    fibroadenomas - benign   BREAST LUMPECTOMY Bilateral    fibroadenomas - benign   VEIN SURGERY     Patient Active Problem List   Diagnosis Date Noted   Strain of muscle, fascia and tendon of lower back, initial encounter 10/16/2018    PCP: Debrah Josette ORN., PA-C  REFERRING PROVIDER: Silva Juliene SAUNDERS, DPM  REFERRING DIAG: Left achilles rupture  THERAPY DIAG:  Pain in left ankle and joints of left foot  Stiffness of left ankle, not elsewhere classified  Muscle weakness (generalized)  Difficulty in walking, not elsewhere classified  Cramp and spasm  Abnormal posture  Rationale for Evaluation and Treatment: Rehabilitation  ONSET DATE: 12/10/23  SUBJECTIVE:   SUBJECTIVE STATEMENT: Patient reports she was pretty sore after last session.  It was quite swollen but I propped it and iced it several times and it's better now.  She arrives in boot but has been doing the wearing schedule we discussed and is up to 1 hr 15 min in the shoe.  She is slowly transitioning to the shoe with the goal of being in the shoe most of the day on Christmas day.        PERTINENT HISTORY: Right achilles  rupture and repair  PAIN: 01/14/24 Are you having pain? Yes: NPRS scale: 0/10 Pain location: none Pain description: none Aggravating factors: weight bearing Relieving factors: rest, ice  PRECAUTIONS: Fall  RED FLAGS: None   WEIGHT BEARING RESTRICTIONS: MD   FALLS:  Has patient fallen in last 6 months? No  LIVING ENVIRONMENT: Lives with: lives with their spouse Lives in: House/apartment Stairs: built a ramp and armed forces logistics/support/administrative officer is on same level Has following equipment at home: knee scooter  OCCUPATION: retired  PLOF: Independent, Independent with basic ADLs, Independent with household mobility without device, Independent with community mobility without device, Independent with homemaking with ambulation, Independent with gait, and Independent with transfers  PATIENT GOALS: to be able to return to her usual workouts and walk normally; return to golf  NEXT MD VISIT: 12/17/23 (surgery 11/04/23)  OBJECTIVE:  Note: Objective measures were completed at Evaluation unless otherwise noted.  DIAGNOSTIC FINDINGS: na  PATIENT SURVEYS:  LEFS  Extreme difficulty/unable (0), Quite a bit of difficulty (1), Moderate difficulty (2), Little difficulty (3), No difficulty (4) Survey date:  12/10/23  Score total:  20/80     COGNITION: Overall cognitive status: Within functional limits for tasks assessed     SENSATION: WFL   POSTURE: Bilateral knee valgus  PALPATION: na  LOWER EXTREMITY ROM:  Active ROM Right eval Left eval  Ankle dorsiflexion  90  Ankle plantarflexion  40  Ankle inversion  32  Ankle eversion  10   (Blank rows = not tested)  LOWER EXTREMITY MMT: deferred due to acuity  MMT Right eval Left eval  Ankle dorsiflexion    Ankle plantarflexion    Ankle inversion    Ankle eversion     (Blank rows = not tested)  LOWER EXTREMITY SPECIAL TESTS:  Ankle special tests: na  FUNCTIONAL TESTS:  5 times sit to stand: 11.16 sec Timed up and go (TUG): 19.19  sec  GAIT: Distance walked: 30 FEET Assistive device utilized: knee scooter Level of assistance: Complete Independence Comments: enjoys walking, golfing and working out  [employer]: 12 figure 8                                                                                                                              TREATMENT DATE:  01/14/24 (9 weeks post op) Nustep x 5 min level 5 in boot (PT present to discuss status) Seated toe taps left x 20 Seated heel raises left x 20 Short foot x 20 Seated towel inv/ev x 20 (10 each side) Seated plantar fascia rolling to relieve heel pain x 2 min Donned normal shoe on left On 2 inch step: ankle DF stretch (gently) x 10 holding 10 sec (discussed backing down on the aggressiveness due to her being fairly sore last session) On balance pad: march x 20, weight shift laterals x 20 then fwd back x 20 (1 set with each foot in front on the fwd/back) Step up and hold left on balance pad, using barre on right x 10 fwd then x 10 lateral SLS at barre x 5 attempting to hold 10 sec (left) Reminded patient to communicate if any irritation feeling with new exercises so that healing tissue does not get weak Standing  (no boot) heel raise with 25-50% of weight through Lt 2 x 10 Single hurdles without boot (forwards & sideways) x 10 each direction UE support as needed Vasocompression moderate compression, coldest setting 15 min with elevation for edema management  01/09/24 (9 weeks post op) Nustep x 5 min level 5 in boot (PT present to discuss status) Seated toe taps left x 20 Seated heel raises left x 20 Short foot x 20 Seated plantar fascia rolling to relieve heel pain x 2 min Seated towel inv/ev x 20 (10 each side) Donned normal shoe on left On 2 inch step: ankle DF stretch (gently) x 10 holding 10 sec On balance pad: weight shift laterals x 20 then fwd back x 20 Step up and hold left on balance pad, using balance pole on right x 10 fwd then x 10  lateral SLS at barre x 5 attempting to hold 10 sec (left) Instructed patient to communicate if any irritation feeling with new exercises so that healing tissue does not get weakk Standing  (no  boot) heel raise with 25-50% of weight through Lt 2 x 10 Single hurdles without boot (forwards & sideways) x 10 each direction UE support as needed Vasocompression moderate compression, coldest setting 10 min with elevation for edema management  01/07/24 (9 week protocol begins) Manual therapy: PROM PF, Inv/Ev, toe ROM; gentle soft tissue work to achilles and gastroc Seated towel toe scrunches x 20 Seated towel inv/ev x 20 On 2 inch step: ankle DF stretch (gently) x 10 holding 10 sec On balance pad: weight shift laterals x 20 then fwd back x 20 SLS at back counter x 5 attempting to hold 10 sec Seated heel raise 2 x 10 Seated heel slides into slight DF x 10 Standing  (no boot) heel raise with 25-50% of weight through Lt 2 x 10 Hurdles with boot on (forwards & sideways) x 5 laps each direction UE support as needed Vasocompression moderate compression, coldest setting 10 min with elevation for edema management  01/02/24  Manual therapy: PROM PF, Inv/Ev, toe ROM; gentle soft tissue work to cablevision systems in prone Seated heel raise 2 x 10 Standing  (no boot) heel raise with 25% of weight through Lt 2 x 10 Standing (no boot) with folded towel as heel wedge: right 3 ways 10x; weight shifting side to side 10x; SLS 3 attempts Hurdles with boot on forwards & sideways) x 3 laps each direction UE support as needed Vasocompression moderate compression, coldest setting 10 min with elevation for edema management  12/10/23  Initial eval completed and initiated HEP   PATIENT EDUCATION:  Education details: Initiated HEP Person educated: Patient Education method: Programmer, Multimedia, Facilities Manager, Verbal cues, and Handouts Education comprehension: verbalized understanding, returned demonstration, and verbal cues  required  HOME EXERCISE PROGRAM: Access Code: [email or web address] URL: [email or web address] Date: 12/10/2023 Prepared by: Delon Haddock  Exercises - Seated Toe Taps  - 1 x daily - 7 x weekly - 3 sets - 10 reps - Seated Heel Raise  - 1 x daily - 7 x weekly - 3 sets - 10 reps - Seated Arch Lifts  - 1 x daily - 7 x weekly - 3 sets - 10 reps - Seated Ankle Alphabet  - 1 x daily - 7 x weekly - 1 sets - 1 reps - a-z hold - Seated Quad Set  - 1 x daily - 7 x weekly - 1 sets - 20 reps  ASSESSMENT:  CLINICAL IMPRESSION: Shaniqwa had soreness and swelling after last session that dissipated over a short time.  She has been able to build up to 1 hr 15 min in the shoe.  We have her wearing her heel cup for now to allow increased ease of transitioning to the shoe.  She was able to complete all tasks again today but we had her go way less aggressive on the activities that create stretch.  Assured her that swelling and soreness will be expected as we progress more into the shoe but to go back into the boot if the soreness and swelling persist.   She is well motivated and compliant.  She should continue to do well.   She would benefit from continuing skilled PT to progress toward goals below.      OBJECTIVE IMPAIRMENTS: Abnormal gait, decreased balance, difficulty walking, decreased ROM, decreased strength, increased fascial restrictions, increased muscle spasms, impaired flexibility, improper body mechanics, postural dysfunction, and pain.   ACTIVITY LIMITATIONS: carrying, lifting, bending, standing, squatting, sleeping, stairs, transfers, bed mobility, bathing, toileting, dressing, and caring for others  PARTICIPATION LIMITATIONS: meal prep, cleaning, laundry, driving, shopping, community activity, occupation, and yard work  PERSONAL FACTORS: Age and Past/current experiences are also affecting patient's functional outcome.   REHAB POTENTIAL: Good  CLINICAL DECISION MAKING:  Stable/uncomplicated  EVALUATION COMPLEXITY: Low   GOALS: Goals reviewed with patient? Yes  SHORT TERM GOALS: Target date: 01/07/2024  Pain report to be no greater than 4/10  Baseline: Goal status: met 12/3  2.  Patient will be independent with initial HEP  Baseline:  Goal status:met 12/3   LONG TERM GOALS: Target date: 02/04/2024  Patient to report pain no greater than 2/10  Baseline:  Goal status: MET 01/14/24  2.  Patient to be independent with advanced HEP  Baseline:  Goal status: In progress  3.  Functional scores to improve by 2-3 seconds Baseline:  Goal status: INITIAL  4.  LEFS to improve to 66/80 or better Baseline:  Goal status: INITIAL  5.  Patient to be able to ambulate with normal heel to toe progression with no a.d. and no boot Baseline:  Goal status: INITIAL  6.  Patient to be able to ascend and descend steps without pain or no greater than 2/10  Baseline:  Goal status: INITIAL   PLAN:  PT FREQUENCY: 2x/week  PT DURATION: 8 weeks  PLANNED INTERVENTIONS: 97110-Therapeutic exercises, 97530- Therapeutic activity, 97112- Neuromuscular re-education, 97535- Self Care, 02859- Manual therapy, [phone]- Gait training, [phone]- Canalith repositioning, [id number]- Aquatic Therapy, [phone]- Electrical stimulation (unattended), [phone]- Electrical stimulation (manual), [id number]- Vasopneumatic device, [id number]- Ultrasound, [id number]- Ionotophoresis 4mg /ml Dexamethasone, 20560 (1-2 muscles), 20561 (3+ muscles)- Dry Needling, Patient/Family education, Balance training, Stair training, Taping, Joint mobilization, Scar mobilization, Vestibular training, DME instructions, Cryotherapy, and Moist heat  PLAN FOR NEXT SESSION: Using Water Quality Scientist ([employer]) achilles repair protocol,  all wedges have been removed.  Patient is transitioning into normal shoe.     Nustep, progress ankle ROM, gait training with shoe; vasocompression; Goal is to be in a shoe on Christmas Day   Delon B.  Jaime Grizzell, PT 01/14/2024 11:34 AM [facility] [address], [address] [place], [place] [address] Phone # [phone] Fax [phone]  "

## 2024-01-20 ENCOUNTER — Ambulatory Visit: Admitting: Physical Therapy

## 2024-01-20 ENCOUNTER — Encounter: Payer: Self-pay | Admitting: Physical Therapy

## 2024-01-20 DIAGNOSIS — R293 Abnormal posture: Secondary | ICD-10-CM

## 2024-01-20 DIAGNOSIS — R252 Cramp and spasm: Secondary | ICD-10-CM

## 2024-01-20 DIAGNOSIS — M25672 Stiffness of left ankle, not elsewhere classified: Secondary | ICD-10-CM

## 2024-01-20 DIAGNOSIS — M25572 Pain in left ankle and joints of left foot: Secondary | ICD-10-CM | POA: Diagnosis not present

## 2024-01-20 DIAGNOSIS — R262 Difficulty in walking, not elsewhere classified: Secondary | ICD-10-CM

## 2024-01-20 DIAGNOSIS — M6281 Muscle weakness (generalized): Secondary | ICD-10-CM

## 2024-01-20 NOTE — Therapy (Signed)
 " OUTPATIENT PHYSICAL THERAPY LOWER EXTREMITY TREATMENT NOTE  Progress Note Reporting Period 12/10/2023 to 01/20/2024  See note below for Objective Data and Assessment of Progress/Goals.     Patient Name: Kendra Norman MRN: [id number] DOB:Nov 15, 1955, 68 y.o., female Today's Date: 01/20/2024  END OF SESSION:  PT End of Session - 01/20/24 1627     Visit Number 10    Date for Recertification  02/04/24    Authorization Type Medicare    Progress Note Due on Visit 20    PT Start Time 1534    PT Stop Time 1630    PT Time Calculation (min) 56 min    Activity Tolerance Patient tolerated treatment well    Behavior During Therapy WFL for tasks assessed/performed            Past Medical History:  Diagnosis Date   GERD (gastroesophageal reflux disease)    Seasonal allergies    Past Surgical History:  Procedure Laterality Date   ABDOMINAL HYSTERECTOMY     BREAST BIOPSY     BREAST EXCISIONAL BIOPSY Bilateral    fibroadenomas - benign   BREAST LUMPECTOMY Bilateral    fibroadenomas - benign   VEIN SURGERY     Patient Active Problem List   Diagnosis Date Noted   Strain of muscle, fascia and tendon of lower back, initial encounter 10/16/2018    PCP: Debrah Josette ORN., PA-C  REFERRING PROVIDER: Silva Juliene SAUNDERS, DPM  REFERRING DIAG: Left achilles rupture  THERAPY DIAG:  Pain in left ankle and joints of left foot  Stiffness of left ankle, not elsewhere classified  Muscle weakness (generalized)  Difficulty in walking, not elsewhere classified  Cramp and spasm  Abnormal posture  Rationale for Evaluation and Treatment: Rehabilitation  ONSET DATE: 12/10/23  SUBJECTIVE:   SUBJECTIVE STATEMENT: Patient presents with 1/10 pain today. She did okay out of the boot Christmas Day and the next day. Saturday it started to hurt and she tried to take it easy. Sunday it hurt worse and she was in the boot 80% of the day. She wore the boot a couple of hours this morning.  She is  currently in a normal shoe.     PERTINENT HISTORY: Right achilles rupture and repair  PAIN: 01/20/24 Are you having pain? Yes: NPRS scale: 1/10 Pain location: none Pain description: none Aggravating factors: weight bearing Relieving factors: rest, ice  PRECAUTIONS: Fall  RED FLAGS: None   WEIGHT BEARING RESTRICTIONS: MD   FALLS:  Has patient fallen in last 6 months? No  LIVING ENVIRONMENT: Lives with: lives with their spouse Lives in: House/apartment Stairs: built a ramp and armed forces logistics/support/administrative officer is on same level Has following equipment at home: knee scooter  OCCUPATION: retired  PLOF: Independent, Independent with basic ADLs, Independent with household mobility without device, Independent with community mobility without device, Independent with homemaking with ambulation, Independent with gait, and Independent with transfers  PATIENT GOALS: to be able to return to her usual workouts and walk normally; return to golf  NEXT MD VISIT: 12/17/23 (surgery 11/04/23)  OBJECTIVE:  Note: Objective measures were completed at Evaluation unless otherwise noted.  DIAGNOSTIC FINDINGS: na  PATIENT SURVEYS:  LEFS  Extreme difficulty/unable (0), Quite a bit of difficulty (1), Moderate difficulty (2), Little difficulty (3), No difficulty (4) Survey date:  12/10/23  Score total:  20/80    01/20/2024 47/80  COGNITION: Overall cognitive status: Within functional limits for tasks assessed     SENSATION: WFL   POSTURE: Bilateral  knee valgus  PALPATION: na  LOWER EXTREMITY ROM:  Active ROM Right eval Left eval  Ankle dorsiflexion  90  Ankle plantarflexion  40  Ankle inversion  32  Ankle eversion  10   (Blank rows = not tested)  LOWER EXTREMITY MMT: deferred due to acuity  MMT Right eval Left eval  Ankle dorsiflexion    Ankle plantarflexion    Ankle inversion    Ankle eversion     (Blank rows = not tested)  LOWER EXTREMITY SPECIAL TESTS:  Ankle special tests:  na  FUNCTIONAL TESTS:  5 times sit to stand: 11.16 sec Timed up and go (TUG): 19.19 sec  01/20/2024 5STS 9.89 sec no UE support TUG:7.17 sec 6MWT:1231FT (increased pain at halfway mark; antalgic gait)- age expected 1345.4 ft  GAIT: Distance walked: 30 FEET Assistive device utilized: knee scooter Level of assistance: Complete Independence Comments: enjoys walking, golfing and working out  [employer]: 12 figure 8                                                                                                                              TREATMENT DATE:  01/20/24 (10 weeks post op) Nustep x 6 min level 5 in boot (PT present to discuss status) See update 5STS, TUG, LEFS above; Goal Assessment MELT method balls on plantar fascia (soft and hard ball) pressure, rinsing, rolling x 12 each  On 2 inch step: ankle DF stretch (gently) x 10 holding 10 sec Standing heel raise 2 x 10 (no pain) Tandem stance + around the worlds with 15 KB x 10 each direction bilateral  Airex step up + march x 12 Lt  Tandem stance (Lt in the back) + ball toss x 20 - attempted with single leg stance but this was too challenging Vasocompression moderate compression, coldest setting 15 min with elevation for edema management    01/14/24 (9 weeks post op) Nustep x 5 min level 5 in boot (PT present to discuss status) Seated toe taps left x 20 Seated heel raises left x 20 Short foot x 20 Seated towel inv/ev x 20 (10 each side) Seated plantar fascia rolling to relieve heel pain x 2 min Donned normal shoe on left On 2 inch step: ankle DF stretch (gently) x 10 holding 10 sec (discussed backing down on the aggressiveness due to her being fairly sore last session) On balance pad: march x 20, weight shift laterals x 20 then fwd back x 20 (1 set with each foot in front on the fwd/back) Step up and hold left on balance pad, using barre on right x 10 fwd then x 10 lateral SLS at barre x 5 attempting to hold 10 sec  (left) Reminded patient to communicate if any irritation feeling with new exercises so that healing tissue does not get weak Standing  (no boot) heel raise with 25-50% of weight through Lt 2 x 10 Single hurdles without boot (forwards & sideways) x  10 each direction UE support as needed Vasocompression moderate compression, coldest setting 15 min with elevation for edema management  01/09/24 (9 weeks post op) Nustep x 5 min level 5 in boot (PT present to discuss status) Seated toe taps left x 20 Seated heel raises left x 20 Short foot x 20 Seated plantar fascia rolling to relieve heel pain x 2 min Seated towel inv/ev x 20 (10 each side) Donned normal shoe on left On 2 inch step: ankle DF stretch (gently) x 10 holding 10 sec On balance pad: weight shift laterals x 20 then fwd back x 20 Step up and hold left on balance pad, using balance pole on right x 10 fwd then x 10 lateral SLS at barre x 5 attempting to hold 10 sec (left) Instructed patient to communicate if any irritation feeling with new exercises so that healing tissue does not get weakk Standing  (no boot) heel raise with 25-50% of weight through Lt 2 x 10 Single hurdles without boot (forwards & sideways) x 10 each direction UE support as needed Vasocompression moderate compression, coldest setting 10 min with elevation for edema management  01/07/24 (9 week protocol begins) Manual therapy: PROM PF, Inv/Ev, toe ROM; gentle soft tissue work to achilles and gastroc Seated towel toe scrunches x 20 Seated towel inv/ev x 20 On 2 inch step: ankle DF stretch (gently) x 10 holding 10 sec On balance pad: weight shift laterals x 20 then fwd back x 20 SLS at back counter x 5 attempting to hold 10 sec Seated heel raise 2 x 10 Seated heel slides into slight DF x 10 Standing  (no boot) heel raise with 25-50% of weight through Lt 2 x 10 Hurdles with boot on (forwards & sideways) x 5 laps each direction UE support as needed Vasocompression  moderate compression, coldest setting 10 min with elevation for edema management   PATIENT EDUCATION:  Education details: Initiated HEP Person educated: Patient Education method: Programmer, Multimedia, Facilities Manager, Verbal cues, and Handouts Education comprehension: verbalized understanding, returned demonstration, and verbal cues required  HOME EXERCISE PROGRAM: Access Code: [email or web address] URL: [email or web address] Date: 12/10/2023 Prepared by: Delon Haddock  Exercises - Seated Toe Taps  - 1 x daily - 7 x weekly - 3 sets - 10 reps - Seated Heel Raise  - 1 x daily - 7 x weekly - 3 sets - 10 reps - Seated Arch Lifts  - 1 x daily - 7 x weekly - 3 sets - 10 reps - Seated Ankle Alphabet  - 1 x daily - 7 x weekly - 1 sets - 1 reps - a-z hold - Seated Quad Set  - 1 x daily - 7 x weekly - 1 sets - 20 reps  ASSESSMENT:  CLINICAL IMPRESSION: Kendra Norman presents with 1/10 pain currently. Upon coming out of the boot she did really well the first two days, but yesterday she had increased pain and wore her boot 80% of the day. Educated patient that she did the right thing. Her next scheduled MD appointment Jan 6th. Treatment session focused on single leg balance and strengthening. She was challenge with tandem balance exercises. Educated patient that she is doing well. Told her that if she has increased pain later today to wear her boot. Patient will benefit from skilled PT to address the below impairments and improve overall function.    OBJECTIVE IMPAIRMENTS: Abnormal gait, decreased balance, difficulty walking, decreased ROM, decreased strength, increased fascial restrictions, increased muscle spasms, impaired flexibility, improper body mechanics,  postural dysfunction, and pain.   ACTIVITY LIMITATIONS: carrying, lifting, bending, standing, squatting, sleeping, stairs, transfers, bed mobility, bathing, toileting, dressing, and caring for others  PARTICIPATION LIMITATIONS: meal prep, cleaning,  laundry, driving, shopping, community activity, occupation, and yard work  PERSONAL FACTORS: Age and Past/current experiences are also affecting patient's functional outcome.   REHAB POTENTIAL: Good  CLINICAL DECISION MAKING: Stable/uncomplicated  EVALUATION COMPLEXITY: Low   GOALS: Goals reviewed with patient? Yes  SHORT TERM GOALS: Target date: 01/07/2024  Pain report to be no greater than 4/10  Baseline: Goal status: met 12/3  2.  Patient will be independent with initial HEP  Baseline:  Goal status:met 12/3   LONG TERM GOALS: Target date: 02/04/2024  Patient to report pain no greater than 2/10  Baseline:  Goal status: MET 01/14/24  2.  Patient to be independent with advanced HEP  Baseline:  Goal status: In progress  3.  Functional scores to improve by 2-3 seconds Baseline:  Goal status: INITIAL  4.  LEFS to improve to 66/80 or better Baseline:  Goal status: INITIAL  5.  Patient to be able to ambulate with normal heel to toe progression with no a.d. and no boot Baseline:  Goal status: INITIAL  6.  Patient to be able to ascend and descend steps without pain or no greater than 2/10  Baseline:  Goal status: INITIAL   PLAN:  PT FREQUENCY: 2x/week  PT DURATION: 8 weeks  PLANNED INTERVENTIONS: 97110-Therapeutic exercises, 97530- Therapeutic activity, 97112- Neuromuscular re-education, 97535- Self Care, 02859- Manual therapy, [phone]- Gait training, [phone]- Canalith repositioning, [id number]- Aquatic Therapy, [phone]- Electrical stimulation (unattended), [phone]- Electrical stimulation (manual), [id number]- Vasopneumatic device, [id number]- Ultrasound, [id number]- Ionotophoresis 4mg /ml Dexamethasone, 20560 (1-2 muscles), 20561 (3+ muscles)- Dry Needling, Patient/Family education, Balance training, Stair training, Taping, Joint mobilization, Scar mobilization, Vestibular training, DME instructions, Cryotherapy, and Moist heat  PLAN FOR NEXT SESSION: Using Water Quality Scientist ([employer])  achilles repair protocol, see how she did after treatment session    Nustep, progress ankle ROM, gait training with shoe; vasocompression; single leg balance; re-cert soon   Kristeen Sar, PT, DPT 01/20/2024 4:38 PM [facility] [address] [place], [place] [address] Phone # [phone] Fax [phone]  "

## 2024-01-28 ENCOUNTER — Encounter: Admitting: Podiatry

## 2024-01-28 ENCOUNTER — Encounter: Payer: Self-pay | Admitting: Physical Therapy

## 2024-01-28 ENCOUNTER — Ambulatory Visit: Attending: Podiatry | Admitting: Physical Therapy

## 2024-01-28 DIAGNOSIS — M25672 Stiffness of left ankle, not elsewhere classified: Secondary | ICD-10-CM | POA: Insufficient documentation

## 2024-01-28 DIAGNOSIS — M6788 Other specified disorders of synovium and tendon, other site: Secondary | ICD-10-CM

## 2024-01-28 DIAGNOSIS — R293 Abnormal posture: Secondary | ICD-10-CM | POA: Diagnosis present

## 2024-01-28 DIAGNOSIS — R252 Cramp and spasm: Secondary | ICD-10-CM | POA: Insufficient documentation

## 2024-01-28 DIAGNOSIS — M25572 Pain in left ankle and joints of left foot: Secondary | ICD-10-CM | POA: Diagnosis present

## 2024-01-28 DIAGNOSIS — M9262 Juvenile osteochondrosis of tarsus, left ankle: Secondary | ICD-10-CM

## 2024-01-28 DIAGNOSIS — R262 Difficulty in walking, not elsewhere classified: Secondary | ICD-10-CM | POA: Insufficient documentation

## 2024-01-28 DIAGNOSIS — M6281 Muscle weakness (generalized): Secondary | ICD-10-CM | POA: Insufficient documentation

## 2024-01-28 DIAGNOSIS — M62462 Contracture of muscle, left lower leg: Secondary | ICD-10-CM

## 2024-01-28 NOTE — Progress Notes (Signed)
"  °  Subjective:  Patient ID: Kendra Norman, female    DOB: [birth year],  MRN: [id number]  Chief Complaint  Patient presents with   Haglund's deformity    MIS Haglund and retro-Calc spur resection, secondary Achilles repair and gastrocnemius recession   DOS : 11/04/23    DOS: 11/04/2023 Procedure: MIS Haglund and retro-Calc spur resection, secondary Achilles repair and gastrocnemius recession  69 y.o. female returns for post-op check.  Overall doing fairly well.  Back in regular shoes has not had to use the boot at all in the last 5 days  Review of Systems: Negative except as noted in the HPI. Denies N/V/F/Ch.   Objective:  There were no vitals filed for this visit. There is no height or weight on file to calculate BMI. Constitutional Well developed. Well nourished.  Vascular Foot warm and well perfused. Capillary refill normal to all digits.  Calf is soft and supple, no posterior calf or knee pain, negative Homans' sign  Neurologic Normal speech. Oriented to person, place, and time. Epicritic sensation to light touch grossly present bilaterally.  Dermatologic Skin healing well without signs of infection. Skin edges well coapted without signs of infection.  Orthopedic: Only mild posterior pain to palpation noted about the surgical site.   Multiple view plain film radiographs: Good resection of Haglund deformity some residual calcifications of tendon and posterior portion but significant improvement Assessment:   1. Haglund's deformity, left   2. Achilles tendinosis of left lower extremity   3. Gastrocnemius equinus of left lower extremity    Plan:  Patient was evaluated and treated and all questions answered.  S/p foot surgery left Over doing well.  Continue physical therapy and weightbearing as tolerated in shoe gear.  May begin and increase gradual exercise as tolerated stick with low impact activity at this point no running jogging or jumping.  Return in 2 months to  reevaluate.   No follow-ups on file.  "

## 2024-01-28 NOTE — Therapy (Signed)
 " OUTPATIENT PHYSICAL THERAPY LOWER EXTREMITY TREATMENT NOTE    Patient Name: Kendra Norman MRN: [id number] DOB:02-17-1955, 69 y.o., female Today's Date: 01/28/2024  END OF SESSION:  PT End of Session - 01/28/24 1206     Visit Number 11    Date for Recertification  02/04/24    Authorization Type Medicare    Progress Note Due on Visit 20    PT Start Time 1017    PT Stop Time 1115    PT Time Calculation (min) 58 min    Activity Tolerance Patient tolerated treatment well    Behavior During Therapy WFL for tasks assessed/performed             Past Medical History:  Diagnosis Date   GERD (gastroesophageal reflux disease)    Seasonal allergies    Past Surgical History:  Procedure Laterality Date   ABDOMINAL HYSTERECTOMY     BREAST BIOPSY     BREAST EXCISIONAL BIOPSY Bilateral    fibroadenomas - benign   BREAST LUMPECTOMY Bilateral    fibroadenomas - benign   VEIN SURGERY     Patient Active Problem List   Diagnosis Date Noted   Strain of muscle, fascia and tendon of lower back, initial encounter 10/16/2018    PCP: Debrah Josette ORN., PA-C  REFERRING PROVIDER: Silva Juliene SAUNDERS, DPM  REFERRING DIAG: Left achilles rupture  THERAPY DIAG:  Pain in left ankle and joints of left foot  Stiffness of left ankle, not elsewhere classified  Muscle weakness (generalized)  Difficulty in walking, not elsewhere classified  Cramp and spasm  Abnormal posture  Rationale for Evaluation and Treatment: Rehabilitation  ONSET DATE: 12/10/23  SUBJECTIVE:   SUBJECTIVE STATEMENT: Patient reports she is doing good today. No pain currently. She has the most pain when doing the heel stretch off the step. She has worn the boot once since last session. She was going up steps to put up her Christmas decorations. She was a little sore after last session.     PERTINENT HISTORY: Right achilles rupture and repair  PAIN: 01/28/2024 Are you having pain? Yes: NPRS scale: 0/10 Pain  location: none Pain description: none Aggravating factors: weight bearing Relieving factors: rest, ice  PRECAUTIONS: Fall  RED FLAGS: None   WEIGHT BEARING RESTRICTIONS: MD   FALLS:  Has patient fallen in last 6 months? No  LIVING ENVIRONMENT: Lives with: lives with their spouse Lives in: House/apartment Stairs: built a ramp and armed forces logistics/support/administrative officer is on same level Has following equipment at home: knee scooter  OCCUPATION: retired  PLOF: Independent, Independent with basic ADLs, Independent with household mobility without device, Independent with community mobility without device, Independent with homemaking with ambulation, Independent with gait, and Independent with transfers  PATIENT GOALS: to be able to return to her usual workouts and walk normally; return to golf  NEXT MD VISIT: 12/17/23 (surgery 11/04/23)  OBJECTIVE:  Note: Objective measures were completed at Evaluation unless otherwise noted.  DIAGNOSTIC FINDINGS: na  PATIENT SURVEYS:  LEFS  Extreme difficulty/unable (0), Quite a bit of difficulty (1), Moderate difficulty (2), Little difficulty (3), No difficulty (4) Survey date:  12/10/23  Score total:  20/80    01/20/2024 47/80  COGNITION: Overall cognitive status: Within functional limits for tasks assessed     SENSATION: WFL   POSTURE: Bilateral knee valgus  PALPATION: na  LOWER EXTREMITY ROM:  Active ROM Right eval Left eval  Ankle dorsiflexion  90  Ankle plantarflexion  40  Ankle inversion  32  Ankle eversion  10   (Blank rows = not tested)  LOWER EXTREMITY MMT: deferred due to acuity  MMT Right eval Left eval  Ankle dorsiflexion    Ankle plantarflexion    Ankle inversion    Ankle eversion     (Blank rows = not tested)  LOWER EXTREMITY SPECIAL TESTS:  Ankle special tests: na  FUNCTIONAL TESTS:  5 times sit to stand: 11.16 sec Timed up and go (TUG): 19.19 sec  01/20/2024 5STS 9.89 sec no UE support TUG:7.17 sec 6MWT:1231FT  (increased pain at halfway mark; antalgic gait)- age expected 1345.4 ft  GAIT: Distance walked: 30 FEET Assistive device utilized: knee scooter Level of assistance: Complete Independence Comments: enjoys walking, golfing and working out  [employer]: 12 figure 8                                                                                                                              TREATMENT DATE:  01/28/2024 (11 weeks post op) Nustep x 5 min level 4 no boot (PT present to discuss status) Standing gastroc stretch at wall 10 x 10 sec hold (still same intensity as stretch off step) Standing heel raise holding 5# KB 2 x 10 Double leg concentric single leg eccentric heel raise on Lt x 10 Seated heel raise with 10# KB on knee 2 x 10 Forward T with UE support at counter 2 x 8 bilateral  Left leg on therapad + right leg swings (forward,side, back) x 10 each direction Mini lunge + march with right (stance on Lt ) 2x 8 Airex step up + march (stepping with Lt ) light UE support x 10- forwards and lateral Tandem stance + around the worlds with 10 KB x 10 each direction bilateral  Lateral lunge x 10 at counter Review and update of HEP Vasocompression moderate compression, coldest setting 15 min with elevation for edema management   01/20/24 (10 weeks post op) Nustep x 6 min level 5 in boot (PT present to discuss status) See update 5STS, TUG, LEFS above; Goal Assessment MELT method balls on plantar fascia (soft and hard ball) pressure, rinsing, rolling x 12 each  On 2 inch step: ankle DF stretch (gently) x 10 holding 10 sec Standing heel raise 2 x 10 (no pain) Tandem stance + around the worlds with 15 KB x 10 each direction bilateral  Airex step up + march x 12 Lt  Tandem stance (Lt in the back) + ball toss x 20 - attempted with single leg stance but this was too challenging Vasocompression moderate compression, coldest setting 15 min with elevation for edema management    01/14/24 (9 weeks  post op) Nustep x 5 min level 5 in boot (PT present to discuss status) Seated toe taps left x 20 Seated heel raises left x 20 Short foot x 20 Seated towel inv/ev x 20 (10 each side) Seated plantar fascia rolling to relieve heel pain x 2 min  Donned normal shoe on left On 2 inch step: ankle DF stretch (gently) x 10 holding 10 sec (discussed backing down on the aggressiveness due to her being fairly sore last session) On balance pad: march x 20, weight shift laterals x 20 then fwd back x 20 (1 set with each foot in front on the fwd/back) Step up and hold left on balance pad, using barre on right x 10 fwd then x 10 lateral SLS at barre x 5 attempting to hold 10 sec (left) Reminded patient to communicate if any irritation feeling with new exercises so that healing tissue does not get weak Standing  (no boot) heel raise with 25-50% of weight through Lt 2 x 10 Single hurdles without boot (forwards & sideways) x 10 each direction UE support as needed Vasocompression moderate compression, coldest setting 15 min with elevation for edema management  01/09/24 (9 weeks post op) Nustep x 5 min level 5 in boot (PT present to discuss status) Seated toe taps left x 20 Seated heel raises left x 20 Short foot x 20 Seated plantar fascia rolling to relieve heel pain x 2 min Seated towel inv/ev x 20 (10 each side) Donned normal shoe on left On 2 inch step: ankle DF stretch (gently) x 10 holding 10 sec On balance pad: weight shift laterals x 20 then fwd back x 20 Step up and hold left on balance pad, using balance pole on right x 10 fwd then x 10 lateral SLS at barre x 5 attempting to hold 10 sec (left) Instructed patient to communicate if any irritation feeling with new exercises so that healing tissue does not get weakk Standing  (no boot) heel raise with 25-50% of weight through Lt 2 x 10 Single hurdles without boot (forwards & sideways) x 10 each direction UE support as needed Vasocompression moderate  compression, coldest setting 10 min with elevation for edema management  01/07/24 (9 week protocol begins) Manual therapy: PROM PF, Inv/Ev, toe ROM; gentle soft tissue work to achilles and gastroc Seated towel toe scrunches x 20 Seated towel inv/ev x 20 On 2 inch step: ankle DF stretch (gently) x 10 holding 10 sec On balance pad: weight shift laterals x 20 then fwd back x 20 SLS at back counter x 5 attempting to hold 10 sec Seated heel raise 2 x 10 Seated heel slides into slight DF x 10 Standing  (no boot) heel raise with 25-50% of weight through Lt 2 x 10 Hurdles with boot on (forwards & sideways) x 5 laps each direction UE support as needed Vasocompression moderate compression, coldest setting 10 min with elevation for edema management   PATIENT EDUCATION:  Education details: Initiated HEP Person educated: Patient Education method: Programmer, Multimedia, Facilities Manager, Verbal cues, and Handouts Education comprehension: verbalized understanding, returned demonstration, and verbal cues required  HOME EXERCISE PROGRAM: Access Code: [email or web address] URL: [email or web address] Date: 01/28/2024 Prepared by: Kristeen Sar  Exercises - Seated Toe Taps  - 1 x daily - 7 x weekly - 3 sets - 10 reps - Seated Heel Raise  - 1 x daily - 7 x weekly - 3 sets - 10 reps - Seated Arch Lifts  - 1 x daily - 7 x weekly - 3 sets - 10 reps - Seated Ankle Alphabet  - 1 x daily - 7 x weekly - 1 sets - 1 reps - a-z hold - Seated Quad Set  - 1 x daily - 7 x weekly - 1 sets - 20 reps - Seated  Heel Raise  - 1 x daily - 7 x weekly - 3 sets - 10 reps - Forward T  - 1 x daily - 7 x weekly - 3 sets - 10 reps - Runner's Lunge  - 1 x daily - 7 x weekly - 2 sets - 8 reps - Long Sitting Calf Stretch with Strap  - 1 x daily - 7 x weekly - 1 sets - 10 reps - 10s hold  ASSESSMENT:  CLINICAL IMPRESSION: Javaeh presents with no current pain. She verbalized increased pain after performing gastroc stretching off a step.  Provided patient with two different versions of stretching since she has increased pain while doing it off the step. With the standing stretch against a wall she still had the same intensity of pain, but told her to try with a stretch strap. She was challenging with single leg balance exercises today especially when UE support was eliminated. Reviewed and updated HEP. With single leg eccentric heel raise she lacked control on the left. Overall, she is progressing well post surgery. Patient will benefit from skilled PT to address the below impairments and improve overall function.     OBJECTIVE IMPAIRMENTS: Abnormal gait, decreased balance, difficulty walking, decreased ROM, decreased strength, increased fascial restrictions, increased muscle spasms, impaired flexibility, improper body mechanics, postural dysfunction, and pain.   ACTIVITY LIMITATIONS: carrying, lifting, bending, standing, squatting, sleeping, stairs, transfers, bed mobility, bathing, toileting, dressing, and caring for others  PARTICIPATION LIMITATIONS: meal prep, cleaning, laundry, driving, shopping, community activity, occupation, and yard work  PERSONAL FACTORS: Age and Past/current experiences are also affecting patient's functional outcome.   REHAB POTENTIAL: Good  CLINICAL DECISION MAKING: Stable/uncomplicated  EVALUATION COMPLEXITY: Low   GOALS: Goals reviewed with patient? Yes  SHORT TERM GOALS: Target date: 01/07/2024  Pain report to be no greater than 4/10  Baseline: Goal status: met 12/3  2.  Patient will be independent with initial HEP  Baseline:  Goal status:met 12/3   LONG TERM GOALS: Target date: 02/04/2024  Patient to report pain no greater than 2/10  Baseline:  Goal status: MET 01/14/24  2.  Patient to be independent with advanced HEP  Baseline:  Goal status: In progress  3.  Functional scores to improve by 2-3 seconds Baseline:  Goal status: INITIAL  4.  LEFS to improve to 66/80 or  better Baseline:  Goal status: INITIAL  5.  Patient to be able to ambulate with normal heel to toe progression with no a.d. and no boot Baseline:  Goal status: INITIAL  6.  Patient to be able to ascend and descend steps without pain or no greater than 2/10  Baseline:  Goal status: INITIAL   PLAN:  PT FREQUENCY: 2x/week  PT DURATION: 8 weeks  PLANNED INTERVENTIONS: 97110-Therapeutic exercises, 97530- Therapeutic activity, 97112- Neuromuscular re-education, 97535- Self Care, 02859- Manual therapy, [phone]- Gait training, [phone]- Canalith repositioning, [id number]- Aquatic Therapy, [phone]- Electrical stimulation (unattended), [id number]- Electrical stimulation (manual), [id number]- Vasopneumatic device, [id number]- Ultrasound, [id number]- Ionotophoresis 4mg /ml Dexamethasone, 79439 (1-2 muscles), 20561 (3+ muscles)- Dry Needling, Patient/Family education, Balance training, Stair training, Taping, Joint mobilization, Scar mobilization, Vestibular training, DME instructions, Cryotherapy, and Moist heat  PLAN FOR NEXT SESSION: ask about MD appointment; try heel raise off a step; Using [address] and [employer] achilles repair protocol, see how she did after treatment session    Nustep, progress ankle ROM, gait training with shoe; vasocompression; single leg balance; re-cert soon   Kristeen Sar, PT, DPT 01/28/2024 12:07 PM [facility] [address], [address] [place], [place] [address] Phone # [phone] Fax [phone]  "

## 2024-01-30 ENCOUNTER — Ambulatory Visit

## 2024-01-30 DIAGNOSIS — M25572 Pain in left ankle and joints of left foot: Secondary | ICD-10-CM

## 2024-01-30 DIAGNOSIS — M6281 Muscle weakness (generalized): Secondary | ICD-10-CM

## 2024-01-30 DIAGNOSIS — M25672 Stiffness of left ankle, not elsewhere classified: Secondary | ICD-10-CM

## 2024-01-30 DIAGNOSIS — R252 Cramp and spasm: Secondary | ICD-10-CM

## 2024-01-30 DIAGNOSIS — R293 Abnormal posture: Secondary | ICD-10-CM

## 2024-01-30 DIAGNOSIS — R262 Difficulty in walking, not elsewhere classified: Secondary | ICD-10-CM

## 2024-01-30 NOTE — Therapy (Signed)
 " OUTPATIENT PHYSICAL THERAPY LOWER EXTREMITY TREATMENT/RECERT NOTE    Patient Name: Kendra Norman MRN: [id number] DOB:08/10/1955, 69 y.o., female Today's Date: 01/30/2024  END OF SESSION:  PT End of Session - 01/30/24 1012     Visit Number 12    Date for Recertification  02/04/24    Authorization Type Medicare    Progress Note Due on Visit 20    PT Start Time 1013    PT Stop Time 1108    PT Time Calculation (min) 55 min    Activity Tolerance Patient tolerated treatment well    Behavior During Therapy WFL for tasks assessed/performed             Past Medical History:  Diagnosis Date   GERD (gastroesophageal reflux disease)    Seasonal allergies    Past Surgical History:  Procedure Laterality Date   ABDOMINAL HYSTERECTOMY     BREAST BIOPSY     BREAST EXCISIONAL BIOPSY Bilateral    fibroadenomas - benign   BREAST LUMPECTOMY Bilateral    fibroadenomas - benign   VEIN SURGERY     Patient Active Problem List   Diagnosis Date Noted   Strain of muscle, fascia and tendon of lower back, initial encounter 10/16/2018    PCP: Debrah Josette ORN., PA-C  REFERRING PROVIDER: Silva Juliene SAUNDERS, DPM  REFERRING DIAG: Left achilles rupture  THERAPY DIAG:  Pain in left ankle and joints of left foot - Plan: PT plan of care cert/re-cert  Stiffness of left ankle, not elsewhere classified - Plan: PT plan of care cert/re-cert  Muscle weakness (generalized) - Plan: PT plan of care cert/re-cert  Difficulty in walking, not elsewhere classified - Plan: PT plan of care cert/re-cert  Cramp and spasm - Plan: PT plan of care cert/re-cert  Abnormal posture - Plan: PT plan of care cert/re-cert  Rationale for Evaluation and Treatment: Rehabilitation  ONSET DATE: 12/10/23  SUBJECTIVE:   SUBJECTIVE STATEMENT: Patient reports she has been experiencing more soreness in the past few days.  She is limping on entering clinic.  She has not gone back in the boot because she felt like it was a  set back if she did.       PERTINENT HISTORY: Right achilles rupture and repair  PAIN: 01/30/2024 Are you having pain? Yes: NPRS scale: 4/10 Pain location: none Pain description: none Aggravating factors: weight bearing Relieving factors: rest, ice  PRECAUTIONS: Fall  RED FLAGS: None   WEIGHT BEARING RESTRICTIONS: MD   FALLS:  Has patient fallen in last 6 months? No  LIVING ENVIRONMENT: Lives with: lives with their spouse Lives in: House/apartment Stairs: built a ramp and armed forces logistics/support/administrative officer is on same level Has following equipment at home: knee scooter  OCCUPATION: retired  PLOF: Independent, Independent with basic ADLs, Independent with household mobility without device, Independent with community mobility without device, Independent with homemaking with ambulation, Independent with gait, and Independent with transfers  PATIENT GOALS: to be able to return to her usual workouts and walk normally; return to golf  NEXT MD VISIT: 12/17/23 (surgery 11/04/23)  OBJECTIVE:  Note: Objective measures were completed at Evaluation unless otherwise noted.  DIAGNOSTIC FINDINGS: na  PATIENT SURVEYS:  LEFS  Extreme difficulty/unable (0), Quite a bit of difficulty (1), Moderate difficulty (2), Little difficulty (3), No difficulty (4) Survey date:  12/10/23 01/20/24 01/30/24  Score total:  20/80 47/80 48/80      COGNITION: Overall cognitive status: Within functional limits for tasks assessed     SENSATION:  WFL   POSTURE: Bilateral knee valgus  PALPATION: na  LOWER EXTREMITY ROM:  Active ROM Right eval Left eval Left 01/30/24  Ankle dorsiflexion  90 8  Ankle plantarflexion  40 45  Ankle inversion  32 32  Ankle eversion  10 22   (Blank rows = not tested)  LOWER EXTREMITY MMT: deferred due to acuity  MMT Right eval Left eval Left  01/30/24  Ankle dorsiflexion   4  Ankle plantarflexion   3+  Ankle inversion   4  Ankle eversion   4   (Blank rows = not tested)  LOWER  EXTREMITY SPECIAL TESTS:  Ankle special tests: na  FUNCTIONAL TESTS:  5 times sit to stand: 11.16 sec Timed up and go (TUG): 19.19 sec  01/20/2024 5STS 9.89 sec no UE support TUG:7.17 sec 6MWT:1231FT (increased pain at halfway mark; antalgic gait)- age expected 1345.4 ft  01/30/24 5STS 9.01 sec no UE support TUG: 5.67 sec : held due to currently exacerbated  GAIT: Distance walked: 30 FEET Assistive device utilized: knee scooter Level of assistance: Complete Independence Comments: enjoys walking, golfing and working out  [employer]: 12 figure 8                                                                                                                              TREATMENT DATE:  01/30/2024 (11 weeks post op) Nustep x 5 min level 4 no boot (PT present to discuss status) Rocker board x 2 min Recert assessment completed: see objective findings Standing ankle inversion/eversion with half roll x 2 min Gait observation Educated on need to allow inflammation to subside : go back in boot for about 2 days  but be sure to do her HEP, especially stretching.   Manual STM to gastoc, soleus, light cross friction to distal achilles, gentle passive DF x 10 min   01/28/2024 (11 weeks post op) Nustep x 5 min level 4 no boot (PT present to discuss status) Standing gastroc stretch at wall 10 x 10 sec hold (still same intensity as stretch off step) Standing heel raise holding 5# KB 2 x 10 Double leg concentric single leg eccentric heel raise on Lt x 10 Seated heel raise with 10# KB on knee 2 x 10 Forward T with UE support at counter 2 x 8 bilateral  Left leg on therapad + right leg swings (forward,side, back) x 10 each direction Mini lunge + march with right (stance on Lt ) 2x 8 Airex step up + march (stepping with Lt ) light UE support x 10- forwards and lateral Tandem stance + around the worlds with 10 KB x 10 each direction bilateral  Lateral lunge x 10 at counter Review and update of  HEP Vasocompression moderate compression, coldest setting 15 min with elevation for edema management   01/20/24 (10 weeks post op) Nustep x 6 min level 5 in boot (PT present to discuss status) See update 5STS, TUG, LEFS  above; Goal Assessment MELT method balls on plantar fascia (soft and hard ball) pressure, rinsing, rolling x 12 each  On 2 inch step: ankle DF stretch (gently) x 10 holding 10 sec Standing heel raise 2 x 10 (no pain) Tandem stance + around the worlds with 15 KB x 10 each direction bilateral  Airex step up + march x 12 Lt  Tandem stance (Lt in the back) + ball toss x 20 - attempted with single leg stance but this was too challenging Vasocompression moderate compression, coldest setting 15 min with elevation for edema management   PATIENT EDUCATION:  Education details: Initiated HEP Person educated: Patient Education method: Programmer, Multimedia, Facilities Manager, Verbal cues, and Handouts Education comprehension: verbalized understanding, returned demonstration, and verbal cues required  HOME EXERCISE PROGRAM: Access Code: [email or web address] URL: [email or web address] Date: 01/28/2024 Prepared by: Kristeen Sar  Exercises - Seated Toe Taps  - 1 x daily - 7 x weekly - 3 sets - 10 reps - Seated Heel Raise  - 1 x daily - 7 x weekly - 3 sets - 10 reps - Seated Arch Lifts  - 1 x daily - 7 x weekly - 3 sets - 10 reps - Seated Ankle Alphabet  - 1 x daily - 7 x weekly - 1 sets - 1 reps - a-z hold - Seated Quad Set  - 1 x daily - 7 x weekly - 1 sets - 20 reps - Seated Heel Raise  - 1 x daily - 7 x weekly - 3 sets - 10 reps - Forward T  - 1 x daily - 7 x weekly - 3 sets - 10 reps - Runner's Lunge  - 1 x daily - 7 x weekly - 2 sets - 8 reps - Long Sitting Calf Stretch with Strap  - 1 x daily - 7 x weekly - 1 sets - 10 reps - 10s hold  ASSESSMENT:  CLINICAL IMPRESSION: Kendra Norman is having some irritation since being out of boot.  She was limping throughout session today.  We did  re-assessment for recert.  Suggested patient go back in boot for 2 days to see if her pain level would dissipate.  Instructed her to continue HEP out of the boot but for going out and about and if she is going to be on her feet for a while at home, to use the boot.  We modified her program down today.  She was able to complete all tasks.  She will be going out of town at the end of January for about 3 weeks.  We will continue to work on getting her pain level to subside and progress through protocol.   Patient will benefit from skilled PT to address the below impairments and improve overall function.     OBJECTIVE IMPAIRMENTS: Abnormal gait, decreased balance, difficulty walking, decreased ROM, decreased strength, increased fascial restrictions, increased muscle spasms, impaired flexibility, improper body mechanics, postural dysfunction, and pain.   ACTIVITY LIMITATIONS: carrying, lifting, bending, standing, squatting, sleeping, stairs, transfers, bed mobility, bathing, toileting, dressing, and caring for others  PARTICIPATION LIMITATIONS: meal prep, cleaning, laundry, driving, shopping, community activity, occupation, and yard work  PERSONAL FACTORS: Age and Past/current experiences are also affecting patient's functional outcome.   REHAB POTENTIAL: Good  CLINICAL DECISION MAKING: Stable/uncomplicated  EVALUATION COMPLEXITY: Low   GOALS: Goals reviewed with patient? Yes  SHORT TERM GOALS: Target date: 01/07/2024  Pain report to be no greater than 4/10  Baseline: Goal status: met 12/3  2.  Patient  will be independent with initial HEP  Baseline:  Goal status:met 12/3   LONG TERM GOALS: Target date: 02/04/2024  Patient to report pain no greater than 2/10  Baseline:  Goal status: MET 01/14/24  2.  Patient to be independent with advanced HEP  Baseline:  Goal status: In progress  3.  Functional scores to improve by 2-3 seconds Baseline:  Goal status: INITIAL  4.  LEFS to  improve to 66/80 or better Baseline:  Goal status: INITIAL  5.  Patient to be able to ambulate with normal heel to toe progression with no a.d. and no boot Baseline:  Goal status: INITIAL  6.  Patient to be able to ascend and descend steps without pain or no greater than 2/10  Baseline:  Goal status: INITIAL   PLAN:  PT FREQUENCY: 2x/week  PT DURATION: 8 weeks  PLANNED INTERVENTIONS: 97110-Therapeutic exercises, 97530- Therapeutic activity, 97112- Neuromuscular re-education, 97535- Self Care, 02859- Manual therapy, [phone]- Gait training, [phone]- Canalith repositioning, [id number]- Aquatic Therapy, [phone]- Electrical stimulation (unattended), [phone]- Electrical stimulation (manual), [id number]- Vasopneumatic device, [id number]- Ultrasound, [id number]- Ionotophoresis 4mg /ml Dexamethasone, 79439 (1-2 muscles), 20561 (3+ muscles)- Dry Needling, Patient/Family education, Balance training, Stair training, Taping, Joint mobilization, Scar mobilization, Vestibular training, DME instructions, Cryotherapy, and Moist heat  PLAN FOR NEXT SESSION: (Using Water Quality Scientist ([employer]) achilles repair protocol) monitor pain, Nustep, progress ankle ROM, gait training with shoe; vasocompression; single leg balance; RECERT COMPLETED today.     Delon B. Kemi Gell, PT 01/30/2024 5:16 PM [facility] [address] [place], [place] [address] Phone # [phone] Fax [phone]  "

## 2024-02-04 ENCOUNTER — Ambulatory Visit: Admitting: Physical Therapy

## 2024-02-04 ENCOUNTER — Encounter: Payer: Self-pay | Admitting: Physical Therapy

## 2024-02-04 DIAGNOSIS — R252 Cramp and spasm: Secondary | ICD-10-CM

## 2024-02-04 DIAGNOSIS — M6281 Muscle weakness (generalized): Secondary | ICD-10-CM

## 2024-02-04 DIAGNOSIS — M25672 Stiffness of left ankle, not elsewhere classified: Secondary | ICD-10-CM

## 2024-02-04 DIAGNOSIS — M25572 Pain in left ankle and joints of left foot: Secondary | ICD-10-CM

## 2024-02-04 DIAGNOSIS — R293 Abnormal posture: Secondary | ICD-10-CM

## 2024-02-04 DIAGNOSIS — R262 Difficulty in walking, not elsewhere classified: Secondary | ICD-10-CM

## 2024-02-04 NOTE — Therapy (Signed)
 " OUTPATIENT PHYSICAL THERAPY LOWER EXTREMITY TREATMENT    Patient Name: Kendra Norman MRN: [id number] DOB:[birth year], 69 y.o., female Today's Date: 02/04/2024  END OF SESSION:  PT End of Session - 02/04/24 1124     Visit Number 13    Date for Recertification  03/27/24    Authorization Type Medicare    Progress Note Due on Visit 20    PT Start Time 1018    PT Stop Time 1118    PT Time Calculation (min) 60 min    Activity Tolerance Patient tolerated treatment well    Behavior During Therapy WFL for tasks assessed/performed              Past Medical History:  Diagnosis Date   GERD (gastroesophageal reflux disease)    Seasonal allergies    Past Surgical History:  Procedure Laterality Date   ABDOMINAL HYSTERECTOMY     BREAST BIOPSY     BREAST EXCISIONAL BIOPSY Bilateral    fibroadenomas - benign   BREAST LUMPECTOMY Bilateral    fibroadenomas - benign   VEIN SURGERY     Patient Active Problem List   Diagnosis Date Noted   Strain of muscle, fascia and tendon of lower back, initial encounter 10/16/2018    PCP: Debrah Josette ORN., PA-C  REFERRING PROVIDER: Silva Juliene SAUNDERS, DPM  REFERRING DIAG: Left achilles rupture  THERAPY DIAG:  Pain in left ankle and joints of left foot  Stiffness of left ankle, not elsewhere classified  Muscle weakness (generalized)  Difficulty in walking, not elsewhere classified  Cramp and spasm  Abnormal posture  Rationale for Evaluation and Treatment: Rehabilitation  ONSET DATE: 12/10/23  SUBJECTIVE:   SUBJECTIVE STATEMENT: Patient reports she is doing okay today. She has a little soreness. She still has tenderness at achilles tendon insertion. She wore a small pump over the weekend and did good with that.     PERTINENT HISTORY: Right achilles rupture and repair  PAIN: 01/30/2024 Are you having pain? Yes: NPRS scale: 4/10 Pain location: none Pain description: none Aggravating factors: weight bearing Relieving factors:  rest, ice  PRECAUTIONS: Fall  RED FLAGS: None   WEIGHT BEARING RESTRICTIONS: MD   FALLS:  Has patient fallen in last 6 months? No  LIVING ENVIRONMENT: Lives with: lives with their spouse Lives in: House/apartment Stairs: built a ramp and armed forces logistics/support/administrative officer is on same level Has following equipment at home: knee scooter  OCCUPATION: retired  PLOF: Independent, Independent with basic ADLs, Independent with household mobility without device, Independent with community mobility without device, Independent with homemaking with ambulation, Independent with gait, and Independent with transfers  PATIENT GOALS: to be able to return to her usual workouts and walk normally; return to golf  NEXT MD VISIT: 12/17/23 (surgery 11/04/23)  OBJECTIVE:  Note: Objective measures were completed at Evaluation unless otherwise noted.  DIAGNOSTIC FINDINGS: na  PATIENT SURVEYS:  LEFS  Extreme difficulty/unable (0), Quite a bit of difficulty (1), Moderate difficulty (2), Little difficulty (3), No difficulty (4) Survey date:  12/10/23 01/20/24 01/30/24  Score total:  20/80 47/80 48/80      COGNITION: Overall cognitive status: Within functional limits for tasks assessed     SENSATION: WFL   POSTURE: Bilateral knee valgus  PALPATION: na  LOWER EXTREMITY ROM:  Active ROM Right eval Left eval Left 01/30/24  Ankle dorsiflexion  90 8  Ankle plantarflexion  40 45  Ankle inversion  32 32  Ankle eversion  10 22   (Blank rows =  not tested)  LOWER EXTREMITY MMT: deferred due to acuity  MMT Right eval Left eval Left  01/30/24  Ankle dorsiflexion   4  Ankle plantarflexion   3+  Ankle inversion   4  Ankle eversion   4   (Blank rows = not tested)  LOWER EXTREMITY SPECIAL TESTS:  Ankle special tests: na  FUNCTIONAL TESTS:  5 times sit to stand: 11.16 sec Timed up and go (TUG): 19.19 sec  01/20/2024 5STS 9.89 sec no UE support TUG:7.17 sec 6MWT:1231FT (increased pain at halfway mark;  antalgic gait)- age expected 1345.4 ft  01/30/24 5STS 9.01 sec no UE support TUG: 5.67 sec : held due to currently exacerbated  GAIT: Distance walked: 30 FEET Assistive device utilized: knee scooter Level of assistance: Complete Independence Comments: enjoys walking, golfing and working out  [employer]: 12 figure 8                                                                                                                              TREATMENT DATE:  02/04/2024 (11 weeks post op) Nustep x 5 min level 4 no boot (PT present to discuss status) Rocker board x 2 min Standing ankle inversion/eversion with half roll x 2 min Standing heel raise with unilateral 5# KB hold 2 x 10 Lunge + march x 10 with unilateral UE support x 10 on Rt Standing ankle inversion/eversion with half roll x 2 min  Left leg on therapad + right leg swings (forward,side, back) x 10 each direction (light UE support) Airex step up + march (stepping with Lt ) light UE support x 10- forwards and lateral Sit to stand 8# DB hold x 10 then staggered with Lt behind x 10 Manual STM to gastoc, soleus, light cross friction to distal achilles, gentle passive DF x 10 min Airex step up + march (stepping with Lt ) light UE support x 10- forwards and lateral    01/30/2024 (11 weeks post op) Nustep x 5 min level 4 no boot (PT present to discuss status) Rocker board x 2 min Recert assessment completed: see objective findings Standing ankle inversion/eversion with half roll x 2 min Gait observation Educated on need to allow inflammation to subside : go back in boot for about 2 days  but be sure to do her HEP, especially stretching.   Manual STM to gastoc, soleus, light cross friction to distal achilles, gentle passive DF x 10 min   01/28/2024 (11 weeks post op) Nustep x 5 min level 4 no boot (PT present to discuss status) Standing gastroc stretch at wall 10 x 10 sec hold (still same intensity as stretch off step) Standing heel  raise holding 5# KB 2 x 10 Double leg concentric single leg eccentric heel raise on Lt x 10 Seated heel raise with 10# KB on knee 2 x 10 Forward T with UE support at counter 2 x 8 bilateral  Left leg on therapad + right  leg swings (forward,side, back) x 10 each direction Mini lunge + march with right (stance on Lt ) 2x 8 Airex step up + march (stepping with Lt ) light UE support x 10- forwards and lateral Tandem stance + around the worlds with 10 KB x 10 each direction bilateral  Lateral lunge x 10 at counter Review and update of HEP Vasocompression moderate compression, coldest setting 15 min with elevation for edema management   01/20/24 (10 weeks post op) Nustep x 6 min level 5 in boot (PT present to discuss status) See update 5STS, TUG, LEFS above; Goal Assessment MELT method balls on plantar fascia (soft and hard ball) pressure, rinsing, rolling x 12 each  On 2 inch step: ankle DF stretch (gently) x 10 holding 10 sec Standing heel raise 2 x 10 (no pain) Tandem stance + around the worlds with 15 KB x 10 each direction bilateral  Airex step up + march x 12 Lt  Tandem stance (Lt in the back) + ball toss x 20 - attempted with single leg stance but this was too challenging Vasocompression moderate compression, coldest setting 15 min with elevation for edema management   PATIENT EDUCATION:  Education details: Initiated HEP Person educated: Patient Education method: Programmer, Multimedia, Facilities Manager, Verbal cues, and Handouts Education comprehension: verbalized understanding, returned demonstration, and verbal cues required  HOME EXERCISE PROGRAM: Access Code: [email or web address] URL: [email or web address] Date: 01/28/2024 Prepared by: Kristeen Sar  Exercises - Seated Toe Taps  - 1 x daily - 7 x weekly - 3 sets - 10 reps - Seated Heel Raise  - 1 x daily - 7 x weekly - 3 sets - 10 reps - Seated Arch Lifts  - 1 x daily - 7 x weekly - 3 sets - 10 reps - Seated Ankle Alphabet  - 1 x  daily - 7 x weekly - 1 sets - 1 reps - a-z hold - Seated Quad Set  - 1 x daily - 7 x weekly - 1 sets - 20 reps - Seated Heel Raise  - 1 x daily - 7 x weekly - 3 sets - 10 reps - Forward T  - 1 x daily - 7 x weekly - 3 sets - 10 reps - Runner's Lunge  - 1 x daily - 7 x weekly - 2 sets - 8 reps - Long Sitting Calf Stretch with Strap  - 1 x daily - 7 x weekly - 1 sets - 10 reps - 10s hold  ASSESSMENT:  CLINICAL IMPRESSION: Kendre is not as painful as last treatment session. She has been compliant with HEP. She did well going into the boot for 2 days last week. Educated patient that is is not regressing, we just have to balance progressing exercises and causing increased pain. She did well with incorporating manual soft tissue techniques at the end of the session. PT monitored patient response throughout and provided cues as needed. Patient will benefit from skilled PT to address the below impairments and improve overall function.      OBJECTIVE IMPAIRMENTS: Abnormal gait, decreased balance, difficulty walking, decreased ROM, decreased strength, increased fascial restrictions, increased muscle spasms, impaired flexibility, improper body mechanics, postural dysfunction, and pain.   ACTIVITY LIMITATIONS: carrying, lifting, bending, standing, squatting, sleeping, stairs, transfers, bed mobility, bathing, toileting, dressing, and caring for others  PARTICIPATION LIMITATIONS: meal prep, cleaning, laundry, driving, shopping, community activity, occupation, and yard work  PERSONAL FACTORS: Age and Past/current experiences are also affecting patient's functional outcome.   REHAB POTENTIAL:  Good  CLINICAL DECISION MAKING: Stable/uncomplicated  EVALUATION COMPLEXITY: Low   GOALS: Goals reviewed with patient? Yes  SHORT TERM GOALS: Target date: 01/07/2024  Pain report to be no greater than 4/10  Baseline: Goal status: met 12/3  2.  Patient will be independent with initial HEP  Baseline:   Goal status:met 12/3   LONG TERM GOALS: Target date: 02/04/2024  Patient to report pain no greater than 2/10  Baseline:  Goal status: MET 01/14/24  2.  Patient to be independent with advanced HEP  Baseline:  Goal status: In progress  3.  Functional scores to improve by 2-3 seconds Baseline:  Goal status: INITIAL  4.  LEFS to improve to 66/80 or better Baseline:  Goal status: INITIAL  5.  Patient to be able to ambulate with normal heel to toe progression with no a.d. and no boot Baseline:  Goal status: INITIAL  6.  Patient to be able to ascend and descend steps without pain or no greater than 2/10  Baseline:  Goal status: INITIAL   PLAN:  PT FREQUENCY: 2x/week  PT DURATION: 8 weeks  PLANNED INTERVENTIONS: 97110-Therapeutic exercises, 97530- Therapeutic activity, 97112- Neuromuscular re-education, 97535- Self Care, 02859- Manual therapy, [phone]- Gait training, [phone]- Canalith repositioning, [id number]- Aquatic Therapy, [phone]- Electrical stimulation (unattended), [phone]- Electrical stimulation (manual), [id number]- Vasopneumatic device, [id number]- Ultrasound, [id number]- Ionotophoresis 4mg /ml Dexamethasone, 20560 (1-2 muscles), 20561 (3+ muscles)- Dry Needling, Patient/Family education, Balance training, Stair training, Taping, Joint mobilization, Scar mobilization, Vestibular training, DME instructions, Cryotherapy, and Moist heat  PLAN FOR NEXT SESSION: (Using Water Quality Scientist ([employer]) achilles repair protocol) monitor pain, Nustep, progress ankle ROM, gait training with shoe; vasocompression; single leg balance; PT at [facility] is Dale Zelphia Kristeen Janit, PT, DPT 02/04/2024 11:25 AM [facility] [address], [address] [place], [place] [address] Phone # [phone] Fax [phone]  "

## 2024-02-06 ENCOUNTER — Ambulatory Visit

## 2024-02-06 DIAGNOSIS — M6281 Muscle weakness (generalized): Secondary | ICD-10-CM

## 2024-02-06 DIAGNOSIS — M25572 Pain in left ankle and joints of left foot: Secondary | ICD-10-CM

## 2024-02-06 DIAGNOSIS — R293 Abnormal posture: Secondary | ICD-10-CM

## 2024-02-06 DIAGNOSIS — R252 Cramp and spasm: Secondary | ICD-10-CM

## 2024-02-06 DIAGNOSIS — M25672 Stiffness of left ankle, not elsewhere classified: Secondary | ICD-10-CM

## 2024-02-06 DIAGNOSIS — R262 Difficulty in walking, not elsewhere classified: Secondary | ICD-10-CM

## 2024-02-06 NOTE — Therapy (Signed)
 " OUTPATIENT PHYSICAL THERAPY LOWER EXTREMITY TREATMENT    Patient Name: Kendra Norman MRN: [id number] DOB:[birth year], 69 y.o., female Today's Date: 02/06/2024  END OF SESSION:  PT End of Session - 02/06/24 1025     Visit Number 14    Date for Recertification  03/27/24    Authorization Type Medicare    Progress Note Due on Visit 20    PT Start Time 1018    PT Stop Time 1109    PT Time Calculation (min) 51 min    Activity Tolerance Patient tolerated treatment well    Behavior During Therapy WFL for tasks assessed/performed              Past Medical History:  Diagnosis Date   GERD (gastroesophageal reflux disease)    Seasonal allergies    Past Surgical History:  Procedure Laterality Date   ABDOMINAL HYSTERECTOMY     BREAST BIOPSY     BREAST EXCISIONAL BIOPSY Bilateral    fibroadenomas - benign   BREAST LUMPECTOMY Bilateral    fibroadenomas - benign   VEIN SURGERY     Patient Active Problem List   Diagnosis Date Noted   Strain of muscle, fascia and tendon of lower back, initial encounter 10/16/2018    PCP: Debrah Josette ORN., PA-C  REFERRING PROVIDER: Silva Juliene SAUNDERS, DPM  REFERRING DIAG: Left achilles rupture  THERAPY DIAG:  Pain in left ankle and joints of left foot  Stiffness of left ankle, not elsewhere classified  Muscle weakness (generalized)  Difficulty in walking, not elsewhere classified  Cramp and spasm  Abnormal posture  Rationale for Evaluation and Treatment: Rehabilitation  ONSET DATE: 12/10/23  SUBJECTIVE:   SUBJECTIVE STATEMENT: Patient reports she is doing much better.  Pain 1-2/10.       PERTINENT HISTORY: Right achilles rupture and repair  PAIN: 02/06/2024 Are you having pain? Yes: NPRS scale: 1-2/10 Pain location: none Pain description: none Aggravating factors: weight bearing Relieving factors: rest, ice  PRECAUTIONS: Fall  RED FLAGS: None   WEIGHT BEARING RESTRICTIONS: MD   FALLS:  Has patient fallen in  last 6 months? No  LIVING ENVIRONMENT: Lives with: lives with their spouse Lives in: House/apartment Stairs: built a ramp and armed forces logistics/support/administrative officer is on same level Has following equipment at home: knee scooter  OCCUPATION: retired  PLOF: Independent, Independent with basic ADLs, Independent with household mobility without device, Independent with community mobility without device, Independent with homemaking with ambulation, Independent with gait, and Independent with transfers  PATIENT GOALS: to be able to return to her usual workouts and walk normally; return to golf  NEXT MD VISIT: 12/17/23 (surgery 11/04/23)  OBJECTIVE:  Note: Objective measures were completed at Evaluation unless otherwise noted.  DIAGNOSTIC FINDINGS: na  PATIENT SURVEYS:  LEFS  Extreme difficulty/unable (0), Quite a bit of difficulty (1), Moderate difficulty (2), Little difficulty (3), No difficulty (4) Survey date:  12/10/23 01/20/24 01/30/24  Score total:  20/80 47/80 48/80      COGNITION: Overall cognitive status: Within functional limits for tasks assessed     SENSATION: WFL   POSTURE: Bilateral knee valgus  PALPATION: na  LOWER EXTREMITY ROM:  Active ROM Right eval Left eval Left 01/30/24  Ankle dorsiflexion  90 8  Ankle plantarflexion  40 45  Ankle inversion  32 32  Ankle eversion  10 22   (Blank rows = not tested)  LOWER EXTREMITY MMT: deferred due to acuity  MMT Right eval Left eval Left  01/30/24  Ankle  dorsiflexion   4  Ankle plantarflexion   3+  Ankle inversion   4  Ankle eversion   4   (Blank rows = not tested)  LOWER EXTREMITY SPECIAL TESTS:  Ankle special tests: na  FUNCTIONAL TESTS:  5 times sit to stand: 11.16 sec Timed up and go (TUG): 19.19 sec  01/20/2024 5STS 9.89 sec no UE support TUG:7.17 sec 6MWT:1231FT (increased pain at halfway mark; antalgic gait)- age expected 1345.4 ft  01/30/24 5STS 9.01 sec no UE support TUG: 5.67 sec : held due to currently  exacerbated  GAIT: Distance walked: 30 FEET Assistive device utilized: knee scooter Level of assistance: Complete Independence Comments: enjoys walking, golfing and working out  [employer]: 12 figure 8                                                                                                                              TREATMENT DATE:  02/04/2024 (12 weeks post op) Recumbent bike x 5 min level 4  (PT present to discuss status) Rocker board x 2 min Bilateral heel raises x 20 with approx 50% weight on left Standing ankle inversion/eversion with half roll x 2 min Lunge + march x 10 with unilateral UE support x 10 on Rt Left leg on therapad + right leg swings (forward,side, back) x 10 each direction (light UE support) Step up and hold on airex x 10 fwd and then x 10 lateral (occasional UE support) left only Lunge to BOSU x 10 fwd then x 10 lateral (left) Seated short foot x 20 Seated towel inv/ev x 10 each side Seated round black BAPS board x 20 DF/PF then INV/EV x 20 Seated 4 way ankle tband with instructions to only do very light resistance on PF (red) left Plantar fascia rolling with ice roller x 2 min Ice pack to left heel in prone with toes hanging off edge of table x 10 min  02/04/2024 (11 weeks post op) Nustep x 5 min level 4 no boot (PT present to discuss status) Rocker board x 2 min Standing ankle inversion/eversion with half roll x 2 min Standing heel raise with unilateral 5# KB hold 2 x 10 Lunge + march x 10 with unilateral UE support x 10 on Rt Standing ankle inversion/eversion with half roll x 2 min  Left leg on therapad + right leg swings (forward,side, back) x 10 each direction (light UE support) Airex step up + march (stepping with Lt ) light UE support x 10- forwards and lateral Sit to stand 8# DB hold x 10 then staggered with Lt behind x 10 Manual STM to gastoc, soleus, light cross friction to distal achilles, gentle passive DF x 10 min Airex step up + march  (stepping with Lt ) light UE support x 10- forwards and lateral    01/30/2024 (11 weeks post op) Nustep x 5 min level 4 no boot (PT present to discuss status) Rocker board x 2 min  Recert assessment completed: see objective findings Standing ankle inversion/eversion with half roll x 2 min Gait observation Educated on need to allow inflammation to subside : go back in boot for about 2 days  but be sure to do her HEP, especially stretching.   Manual STM to gastoc, soleus, light cross friction to distal achilles, gentle passive DF x 10 min   PATIENT EDUCATION:  Education details: Initiated HEP Person educated: Patient Education method: Programmer, Multimedia, Facilities Manager, Verbal cues, and Handouts Education comprehension: verbalized understanding, returned demonstration, and verbal cues required  HOME EXERCISE PROGRAM: Access Code: [email or web address] URL: [email or web address] Date: 01/28/2024 Prepared by: Kristeen Sar  Exercises - Seated Toe Taps  - 1 x daily - 7 x weekly - 3 sets - 10 reps - Seated Heel Raise  - 1 x daily - 7 x weekly - 3 sets - 10 reps - Seated Arch Lifts  - 1 x daily - 7 x weekly - 3 sets - 10 reps - Seated Ankle Alphabet  - 1 x daily - 7 x weekly - 1 sets - 1 reps - a-z hold - Seated Quad Set  - 1 x daily - 7 x weekly - 1 sets - 20 reps - Seated Heel Raise  - 1 x daily - 7 x weekly - 3 sets - 10 reps - Forward T  - 1 x daily - 7 x weekly - 3 sets - 10 reps - Runner's Lunge  - 1 x daily - 7 x weekly - 2 sets - 8 reps - Long Sitting Calf Stretch with Strap  - 1 x daily - 7 x weekly - 1 sets - 10 reps - 10s hold  ASSESSMENT:  CLINICAL IMPRESSION: Lanika is still doing well since taking a break and being in the boot for a few days.  We are back at full protocol and she demonstrates good tolerance to this.  She still needs some UE support for single leg balance tasks on balance pad but these are higher level balance tasks.  She tends to move quickly through reps which  increases the number of exercises she does each visit.  We stopped to do some sitting exercises today as she had done several high level exercises in a small amount of her treatment time.  PT monitored patient response throughout and provided cues as needed. Patient will benefit from skilled PT to address the below impairments and improve overall function.   OBJECTIVE IMPAIRMENTS: Abnormal gait, decreased balance, difficulty walking, decreased ROM, decreased strength, increased fascial restrictions, increased muscle spasms, impaired flexibility, improper body mechanics, postural dysfunction, and pain.   ACTIVITY LIMITATIONS: carrying, lifting, bending, standing, squatting, sleeping, stairs, transfers, bed mobility, bathing, toileting, dressing, and caring for others  PARTICIPATION LIMITATIONS: meal prep, cleaning, laundry, driving, shopping, community activity, occupation, and yard work  PERSONAL FACTORS: Age and Past/current experiences are also affecting patient's functional outcome.   REHAB POTENTIAL: Good  CLINICAL DECISION MAKING: Stable/uncomplicated  EVALUATION COMPLEXITY: Low   GOALS: Goals reviewed with patient? Yes  SHORT TERM GOALS: Target date: 01/07/2024  Pain report to be no greater than 4/10  Baseline: Goal status: met 12/3  2.  Patient will be independent with initial HEP  Baseline:  Goal status:met 12/3   LONG TERM GOALS: Target date: 02/04/2024  Patient to report pain no greater than 2/10  Baseline:  Goal status: MET 01/14/24  2.  Patient to be independent with advanced HEP  Baseline:  Goal status: In progress  3.  Functional  scores to improve by 2-3 seconds Baseline:  Goal status: INITIAL  4.  LEFS to improve to 66/80 or better Baseline:  Goal status: INITIAL  5.  Patient to be able to ambulate with normal heel to toe progression with no a.d. and no boot Baseline:  Goal status: In Progress  6.  Patient to be able to ascend and descend steps  without pain or no greater than 2/10  Baseline:  Goal status: In progress   PLAN:  PT FREQUENCY: 2x/week  PT DURATION: 8 weeks  PLANNED INTERVENTIONS: 97110-Therapeutic exercises, 97530- Therapeutic activity, 97112- Neuromuscular re-education, [phone]- Self Care, 02859- Manual therapy, [phone]- Gait training, [phone]- Canalith repositioning, [phone]- Aquatic Therapy, [phone]- Electrical stimulation (unattended), [phone]- Electrical stimulation (manual), [id number]- Vasopneumatic device, [id number]- Ultrasound, [id number]- Ionotophoresis 4mg /ml Dexamethasone, 20560 (1-2 muscles), 20561 (3+ muscles)- Dry Needling, Patient/Family education, Balance training, Stair training, Taping, Joint mobilization, Scar mobilization, Vestibular training, DME instructions, Cryotherapy, and Moist heat  PLAN FOR NEXT SESSION: (Using [employer] achilles repair protocol) monitor pain, Nustep, progress ankle ROM, gait training with shoe; vasocompression; single leg balance; PT at [facility] [employer] B. Breon Rehm, PT 02/06/24 11:05 AM [facility] [address] [place], [place] [address] Phone # [phone] Fax [phone]  "

## 2024-02-11 ENCOUNTER — Ambulatory Visit

## 2024-02-11 DIAGNOSIS — M25572 Pain in left ankle and joints of left foot: Secondary | ICD-10-CM | POA: Diagnosis not present

## 2024-02-11 DIAGNOSIS — R252 Cramp and spasm: Secondary | ICD-10-CM

## 2024-02-11 DIAGNOSIS — M25672 Stiffness of left ankle, not elsewhere classified: Secondary | ICD-10-CM

## 2024-02-11 DIAGNOSIS — R262 Difficulty in walking, not elsewhere classified: Secondary | ICD-10-CM

## 2024-02-11 DIAGNOSIS — R293 Abnormal posture: Secondary | ICD-10-CM

## 2024-02-11 DIAGNOSIS — M6281 Muscle weakness (generalized): Secondary | ICD-10-CM

## 2024-02-11 NOTE — Therapy (Signed)
 " OUTPATIENT PHYSICAL THERAPY LOWER EXTREMITY TREATMENT    Patient Name: Kendra Norman MRN: [id number] DOB:01-May-1955, 69 y.o., female Today's Date: 02/11/2024  END OF SESSION:  PT End of Session - 02/11/24 0807     Visit Number 15    Date for Recertification  03/27/24    Authorization Type Medicare    Progress Note Due on Visit 20    PT Start Time 0803    PT Stop Time 0855    PT Time Calculation (min) 52 min    Activity Tolerance Patient tolerated treatment well    Behavior During Therapy WFL for tasks assessed/performed              Past Medical History:  Diagnosis Date   GERD (gastroesophageal reflux disease)    Seasonal allergies    Past Surgical History:  Procedure Laterality Date   ABDOMINAL HYSTERECTOMY     BREAST BIOPSY     BREAST EXCISIONAL BIOPSY Bilateral    fibroadenomas - benign   BREAST LUMPECTOMY Bilateral    fibroadenomas - benign   VEIN SURGERY     Patient Active Problem List   Diagnosis Date Noted   Strain of muscle, fascia and tendon of lower back, initial encounter 10/16/2018    PCP: Debrah Josette ORN., PA-C  REFERRING PROVIDER: Silva Juliene SAUNDERS, DPM  REFERRING DIAG: Left achilles rupture  THERAPY DIAG:  Pain in left ankle and joints of left foot  Stiffness of left ankle, not elsewhere classified  Muscle weakness (generalized)  Difficulty in walking, not elsewhere classified  Cramp and spasm  Abnormal posture  Rationale for Evaluation and Treatment: Rehabilitation  ONSET DATE: 12/10/23  SUBJECTIVE:   SUBJECTIVE STATEMENT: Patient reports she is doing much better.  Pain 1-2/10.       PERTINENT HISTORY: Right achilles rupture and repair  PAIN: 02/06/2024 Are you having pain? Yes: NPRS scale: 1-2/10 Pain location: none Pain description: none Aggravating factors: weight bearing Relieving factors: rest, ice  PRECAUTIONS: Fall  RED FLAGS: None   WEIGHT BEARING RESTRICTIONS: MD   FALLS:  Has patient fallen in  last 6 months? No  LIVING ENVIRONMENT: Lives with: lives with their spouse Lives in: House/apartment Stairs: built a ramp and armed forces logistics/support/administrative officer is on same level Has following equipment at home: knee scooter  OCCUPATION: retired  PLOF: Independent, Independent with basic ADLs, Independent with household mobility without device, Independent with community mobility without device, Independent with homemaking with ambulation, Independent with gait, and Independent with transfers  PATIENT GOALS: to be able to return to her usual workouts and walk normally; return to golf  NEXT MD VISIT: 12/17/23 (surgery 11/04/23)  OBJECTIVE:  Note: Objective measures were completed at Evaluation unless otherwise noted.  DIAGNOSTIC FINDINGS: na  PATIENT SURVEYS:  LEFS  Extreme difficulty/unable (0), Quite a bit of difficulty (1), Moderate difficulty (2), Little difficulty (3), No difficulty (4) Survey date:  12/10/23 01/20/24 01/30/24  Score total:  20/80 47/80 48/80      COGNITION: Overall cognitive status: Within functional limits for tasks assessed     SENSATION: WFL   POSTURE: Bilateral knee valgus  PALPATION: na  LOWER EXTREMITY ROM:  Active ROM Right eval Left eval Left 01/30/24  Ankle dorsiflexion  90 8  Ankle plantarflexion  40 45  Ankle inversion  32 32  Ankle eversion  10 22   (Blank rows = not tested)  LOWER EXTREMITY MMT: deferred due to acuity  MMT Right eval Left eval Left  01/30/24  Ankle  dorsiflexion   4  Ankle plantarflexion   3+  Ankle inversion   4  Ankle eversion   4   (Blank rows = not tested)  LOWER EXTREMITY SPECIAL TESTS:  Ankle special tests: na  FUNCTIONAL TESTS:  5 times sit to stand: 11.16 sec Timed up and go (TUG): 19.19 sec  01/20/2024 5STS 9.89 sec no UE support TUG:7.17 sec 6MWT:1231FT (increased pain at halfway mark; antalgic gait)- age expected 1345.4 ft  01/30/24 5STS 9.01 sec no UE support TUG: 5.67 sec : held due to currently  exacerbated  GAIT: Distance walked: 30 FEET Assistive device utilized: knee scooter Level of assistance: Complete Independence Comments: enjoys walking, golfing and working out  [employer]: 12 figure 8                                                                                                                              TREATMENT DATE:  02/11/2024 (12 weeks post op) Recumbent bike x 5 min level 4  (PT present to discuss status) Rocker board x 2 min Bilateral heel raises x 20 with approx 75% weight on left Standing gastroc stretch 5  x 10 sec Standing soleus stretch 5 x 10 using rocker board  Standing ankle inversion/eversion with half roll x 2 min Lunge + march x 10 with unilateral UE support x 10 on Rt Left leg on therapad + right leg swings (forward,side, back) x 10 each direction (light UE support) Golf simulation swings x 10 to assess tolerance and form Step up and hold on airex x 10 fwd and then x 10 lateral (occasional UE support) left only Lunge to BOSU x 10 fwd then x 10 lateral (left) Cone touches 3 x 10 (cone on counter top, standing on left LE on floor) Lateral band walks for hip strength and stability to protect ankle and foot with blue loop x 3 laps of 15 feet Standing hip ER with blue band 3 x 10 Side lying clam with blue band 2 x 10 each side Seated 4 way ankle tband with instructions to only do very light resistance on PF (red) left Ice pack to left heel in prone with toes hanging off edge of table x 10 min  02/06/2024 (12 weeks post op) Recumbent bike x 5 min level 4  (PT present to discuss status) Rocker board x 2 min Bilateral heel raises x 20 with approx 50% weight on left Standing ankle inversion/eversion with half roll x 2 min Lunge + march x 10 with unilateral UE support x 10 on Rt Left leg on therapad + right leg swings (forward,side, back) x 10 each direction (light UE support) Step up and hold on airex x 10 fwd and then x 10 lateral (occasional UE  support) left only Lunge to BOSU x 10 fwd then x 10 lateral (left) Seated short foot x 20 Seated towel inv/ev x 10 each side Seated round black BAPS board x 20 DF/PF  then INV/EV x 20 Seated 4 way ankle tband with instructions to only do very light resistance on PF (red) left Plantar fascia rolling with ice roller x 2 min Ice pack to left heel in prone with toes hanging off edge of table x 10 min  02/04/2024 (11 weeks post op) Nustep x 5 min level 4 no boot (PT present to discuss status) Rocker board x 2 min Standing ankle inversion/eversion with half roll x 2 min Standing heel raise with unilateral 5# KB hold 2 x 10 Lunge + march x 10 with unilateral UE support x 10 on Rt Standing ankle inversion/eversion with half roll x 2 min  Left leg on therapad + right leg swings (forward,side, back) x 10 each direction (light UE support) Airex step up + march (stepping with Lt ) light UE support x 10- forwards and lateral Sit to stand 8# DB hold x 10 then staggered with Lt behind x 10 Manual STM to gastoc, soleus, light cross friction to distal achilles, gentle passive DF x 10 min Airex step up + march (stepping with Lt ) light UE support x 10- forwards and lateral   PATIENT EDUCATION:  Education details: Initiated HEP Person educated: Patient Education method: Programmer, Multimedia, Facilities Manager, Verbal cues, and Handouts Education comprehension: verbalized understanding, returned demonstration, and verbal cues required  HOME EXERCISE PROGRAM: Access Code: [email or web address] URL: [email or web address] Date: 01/28/2024 Prepared by: Kristeen Sar  Exercises - Seated Toe Taps  - 1 x daily - 7 x weekly - 3 sets - 10 reps - Seated Heel Raise  - 1 x daily - 7 x weekly - 3 sets - 10 reps - Seated Arch Lifts  - 1 x daily - 7 x weekly - 3 sets - 10 reps - Seated Ankle Alphabet  - 1 x daily - 7 x weekly - 1 sets - 1 reps - a-z hold - Seated Quad Set  - 1 x daily - 7 x weekly - 1 sets - 20 reps - Seated  Heel Raise  - 1 x daily - 7 x weekly - 3 sets - 10 reps - Forward T  - 1 x daily - 7 x weekly - 3 sets - 10 reps - Runner's Lunge  - 1 x daily - 7 x weekly - 2 sets - 8 reps - Long Sitting Calf Stretch with Strap  - 1 x daily - 7 x weekly - 1 sets - 10 reps - 10s hold  ASSESSMENT:  CLINICAL IMPRESSION: Vannesa has been doing well.  No set backs since that last exacerbation.   Has not been in boot. She demonstrates normal heel to toe progression upon entering today.  She is beginning to resume some of her previous activities.  She will be playing golf when she travels to [place] .  We had her take a few practice swings today.  She demonstrates some slight difficulty with stabilizing on follow through but this should improve.  She had no pain with the actual swing.  We suggested that she plan on 9 holes to start with and to take her boot in the event she becomes sore.  Then, if she does well with the 9 holes, try 18 the next day.  She should do well as she has had several days without any pain at all.   Patient will benefit from skilled PT to address the below impairments and improve overall function.   OBJECTIVE IMPAIRMENTS: Abnormal gait, decreased balance, difficulty walking, decreased ROM, decreased strength, increased  fascial restrictions, increased muscle spasms, impaired flexibility, improper body mechanics, postural dysfunction, and pain.   ACTIVITY LIMITATIONS: carrying, lifting, bending, standing, squatting, sleeping, stairs, transfers, bed mobility, bathing, toileting, dressing, and caring for others  PARTICIPATION LIMITATIONS: meal prep, cleaning, laundry, driving, shopping, community activity, occupation, and yard work  PERSONAL FACTORS: Age and Past/current experiences are also affecting patient's functional outcome.   REHAB POTENTIAL: Good  CLINICAL DECISION MAKING: Stable/uncomplicated  EVALUATION COMPLEXITY: Low   GOALS: Goals reviewed with patient? Yes  SHORT TERM GOALS:  Target date: 01/07/2024  Pain report to be no greater than 4/10  Baseline: Goal status: met 12/3  2.  Patient will be independent with initial HEP  Baseline:  Goal status:met 12/3   LONG TERM GOALS: Target date: 02/04/2024  Patient to report pain no greater than 2/10  Baseline:  Goal status: MET 01/14/24  2.  Patient to be independent with advanced HEP  Baseline:  Goal status: In progress  3.  Functional scores to improve by 2-3 seconds Baseline:  Goal status: INITIAL  4.  LEFS to improve to 66/80 or better Baseline:  Goal status: INITIAL  5.  Patient to be able to ambulate with normal heel to toe progression with no a.d. and no boot Baseline:  Goal status: MET 02/11/24  6.  Patient to be able to ascend and descend steps without pain or no greater than 2/10  Baseline:  Goal status: In progress   PLAN:  PT FREQUENCY: 2x/week  PT DURATION: 8 weeks  PLANNED INTERVENTIONS: 97110-Therapeutic exercises, 97530- Therapeutic activity, [id number]- Neuromuscular re-education, 97535- Self Care, 02859- Manual therapy, [phone]- Gait training, [phone]- Canalith repositioning, [id number]- Aquatic Therapy, [phone]- Electrical stimulation (unattended), [phone]- Electrical stimulation (manual), [id number]- Vasopneumatic device, [id number]- Ultrasound, [id number]- Ionotophoresis 4mg /ml Dexamethasone, 20560 (1-2 muscles), 20561 (3+ muscles)- Dry Needling, Patient/Family education, Balance training, Stair training, Taping, Joint mobilization, Scar mobilization, Vestibular training, DME instructions, Cryotherapy, and Moist heat  PLAN FOR NEXT SESSION: Nustep, progress ankle ROM, functional training, hip strengthening to assist with good alignment and ankle support. Gait training with shoe; vasocompression; single leg balance;(Using [employer] achilles repair protocol) monitor pain   Debralee Braaksma B. Deisy Ozbun, PT 02/11/24 9:29 AM [facility] [address] [place],  [place] [address] Phone # [phone] Fax [phone]  "

## 2024-02-13 ENCOUNTER — Ambulatory Visit

## 2024-02-13 DIAGNOSIS — R252 Cramp and spasm: Secondary | ICD-10-CM

## 2024-02-13 DIAGNOSIS — R293 Abnormal posture: Secondary | ICD-10-CM

## 2024-02-13 DIAGNOSIS — M25572 Pain in left ankle and joints of left foot: Secondary | ICD-10-CM

## 2024-02-13 DIAGNOSIS — M6281 Muscle weakness (generalized): Secondary | ICD-10-CM

## 2024-02-13 DIAGNOSIS — M25672 Stiffness of left ankle, not elsewhere classified: Secondary | ICD-10-CM

## 2024-02-13 DIAGNOSIS — R262 Difficulty in walking, not elsewhere classified: Secondary | ICD-10-CM

## 2024-02-13 NOTE — Therapy (Signed)
 " OUTPATIENT PHYSICAL THERAPY LOWER EXTREMITY TREATMENT    Kendra Norman Name: Kendra Norman MRN: [id number] DOB:[birth year], 69 y.o., female Today's Date: 02/13/2024  END OF SESSION:  PT End of Session - 02/13/24 0853     Visit Number 16    Date for Recertification  03/27/24    Authorization Type Medicare    PT Start Time 0847    PT Stop Time 0938    PT Time Calculation (min) 51 min    Activity Tolerance Kendra Norman tolerated treatment well    Behavior During Therapy WFL for tasks assessed/performed              Past Medical History:  Diagnosis Date   GERD (gastroesophageal reflux disease)    Seasonal allergies    Past Surgical History:  Procedure Laterality Date   ABDOMINAL HYSTERECTOMY     BREAST BIOPSY     BREAST EXCISIONAL BIOPSY Bilateral    fibroadenomas - benign   BREAST LUMPECTOMY Bilateral    fibroadenomas - benign   VEIN SURGERY     Kendra Norman Active Problem List   Diagnosis Date Noted   Strain of muscle, fascia and tendon of lower back, initial encounter 10/16/2018    PCP: Debrah Josette ORN., PA-C  REFERRING PROVIDER: Silva Juliene SAUNDERS, DPM  REFERRING DIAG: Left achilles rupture  THERAPY DIAG:  Pain in left ankle and joints of left foot  Stiffness of left ankle, not elsewhere classified  Muscle weakness (generalized)  Difficulty in walking, not elsewhere classified  Cramp and spasm  Abnormal posture  Rationale for Evaluation and Treatment: Rehabilitation  ONSET DATE: 12/10/23  SUBJECTIVE:   SUBJECTIVE STATEMENT: Kendra Norman reports she is a little sore this morning.  She explains that after last session, she was uncomfortable and it did get a little sore but the next day she was perfectly fine.  However, she then went grocery shopping and the soreness returned.  She admits its mild but she is frustrated that she is having setbacks.  PT explained that this is very normal for foot and ankle surgeries and that she is actually doing very well.         PERTINENT HISTORY: Right achilles rupture and repair  PAIN: 02/13/2024 Are you having pain? Yes: NPRS scale: 2-3/10 Pain location: none Pain description: none Aggravating factors: weight bearing Relieving factors: rest, ice  PRECAUTIONS: Fall  RED FLAGS: None   WEIGHT BEARING RESTRICTIONS: MD   FALLS:  Has Kendra Norman fallen in last 6 months? No  LIVING ENVIRONMENT: Lives with: lives with their spouse Lives in: House/apartment Stairs: built a ramp and armed forces logistics/support/administrative officer is on same level Has following equipment at home: knee scooter  OCCUPATION: retired  PLOF: Independent, Independent with basic ADLs, Independent with household mobility without device, Independent with community mobility without device, Independent with homemaking with ambulation, Independent with gait, and Independent with transfers  Kendra Norman GOALS: to be able to return to her usual workouts and walk normally; return to golf  NEXT MD VISIT: 12/17/23 (surgery 11/04/23)  OBJECTIVE:  Note: Objective measures were completed at Evaluation unless otherwise noted.  DIAGNOSTIC FINDINGS: na  Kendra Norman SURVEYS:  LEFS  Extreme difficulty/unable (0), Quite a bit of difficulty (1), Moderate difficulty (2), Little difficulty (3), No difficulty (4) Survey date:  12/10/23 01/20/24 01/30/24  Score total:  20/80 47/80 48/80      COGNITION: Overall cognitive status: Within functional limits for tasks assessed     SENSATION: WFL   POSTURE: Bilateral knee valgus  PALPATION: na  LOWER EXTREMITY ROM:  Active ROM Right eval Left eval Left 01/30/24  Ankle dorsiflexion  90 8  Ankle plantarflexion  40 45  Ankle inversion  32 32  Ankle eversion  10 22   (Blank rows = not tested)  LOWER EXTREMITY MMT: deferred due to acuity  MMT Right eval Left eval Left  01/30/24  Ankle dorsiflexion   4  Ankle plantarflexion   3+  Ankle inversion   4  Ankle eversion   4   (Blank rows = not tested)  LOWER EXTREMITY SPECIAL TESTS:   Ankle special tests: na  FUNCTIONAL TESTS:  5 times sit to stand: 11.16 sec Timed up and go (TUG): 19.19 sec  01/20/2024 5STS 9.89 sec no UE support TUG:7.17 sec 6MWT:1231FT (increased pain at halfway mark; antalgic gait)- age expected 1345.4 ft  01/30/24 5STS 9.01 sec no UE support TUG: 5.67 sec : held due to currently exacerbated  GAIT: Distance walked: 30 FEET Assistive device utilized: knee scooter Level of assistance: Complete Independence Comments: enjoys walking, golfing and working out  [employer]: 12 figure 8                                                                                                                              TREATMENT DATE:  02/13/2024 (12 weeks post op) Recumbent bike x 5 min level 4  (PT present to discuss status) Rocker board x 2 min Gastroc stretch on rocker board with vc's for gentle stretch x 10 holding 10 sec Soleus stretch on rocker board with vc's for VERY gentle stretch x 10 holding 10 sec Seated round black BAPS board x 20 DF/PF then INV/EV x 20 Seated short foot x 20 Seated 4 way ankle tband with instructions to only do very light resistance on PF (red) left Foot roller x 2 min (half sitting and half standing) Standing ankle inversion/eversion with half roll x 2 min Seated 4 way ankle tband with instructions to only do very light resistance on PF (red) left Manual STM to plantar fascia and gastroc/soleus, PROM left ankle and foot, cross friction massage to left heel area. Discussed/educated on how to proceed over the weekend, to allow the inflammation to settle down again.  Use boot if going out for long periods of time or when she will be standing in one place for long periods of time.     Ice pack to left heel in prone with toes hanging off edge of table x 10 min  02/11/2024 (12 weeks post op) Recumbent bike x 5 min level 4  (PT present to discuss status) Rocker board x 2 min Bilateral heel raises x 20 with approx 75% weight on  left Standing gastroc stretch 5  x 10 sec Standing soleus stretch 5 x 10 using rocker board  Standing ankle inversion/eversion with half roll x 2 min Lunge + march x 10 with unilateral UE support x 10 on Rt Left leg on therapad +  right leg swings (forward,side, back) x 10 each direction (light UE support) Golf simulation swings x 10 to assess tolerance and form Step up and hold on airex x 10 fwd and then x 10 lateral (occasional UE support) left only Lunge to BOSU x 10 fwd then x 10 lateral (left) Cone touches 3 x 10 (cone on counter top, standing on left LE on floor) Lateral band walks for hip strength and stability to protect ankle and foot with blue loop x 3 laps of 15 feet Standing hip ER with blue band 3 x 10 Side lying clam with blue band 2 x 10 each side Seated 4 way ankle tband with instructions to only do very light resistance on PF (red) left Ice pack to left heel in prone with toes hanging off edge of table x 10 min  02/06/2024 (12 weeks post op) Recumbent bike x 5 min level 4  (PT present to discuss status) Rocker board x 2 min Bilateral heel raises x 20 with approx 50% weight on left Standing ankle inversion/eversion with half roll x 2 min Lunge + march x 10 with unilateral UE support x 10 on Rt Left leg on therapad + right leg swings (forward,side, back) x 10 each direction (light UE support) Step up and hold on airex x 10 fwd and then x 10 lateral (occasional UE support) left only Lunge to BOSU x 10 fwd then x 10 lateral (left) Seated short foot x 20 Seated towel inv/ev x 10 each side Seated round black BAPS board x 20 DF/PF then INV/EV x 20 Seated 4 way ankle tband with instructions to only do very light resistance on PF (red) left Plantar fascia rolling with ice roller x 2 min Ice pack to left heel in prone with toes hanging off edge of table x 10 min   Kendra Norman EDUCATION:  Education details: Initiated HEP Person educated: Kendra Norman Education method: Programmer, Multimedia,  Facilities Manager, Verbal cues, and Handouts Education comprehension: verbalized understanding, returned demonstration, and verbal cues required  HOME EXERCISE PROGRAM: Access Code: [email or web address] URL: [email or web address] Date: 01/28/2024 Prepared by: Kristeen Sar  Exercises - Seated Toe Taps  - 1 x daily - 7 x weekly - 3 sets - 10 reps - Seated Heel Raise  - 1 x daily - 7 x weekly - 3 sets - 10 reps - Seated Arch Lifts  - 1 x daily - 7 x weekly - 3 sets - 10 reps - Seated Ankle Alphabet  - 1 x daily - 7 x weekly - 1 sets - 1 reps - a-z hold - Seated Quad Set  - 1 x daily - 7 x weekly - 1 sets - 20 reps - Seated Heel Raise  - 1 x daily - 7 x weekly - 3 sets - 10 reps - Forward T  - 1 x daily - 7 x weekly - 3 sets - 10 reps - Runner's Lunge  - 1 x daily - 7 x weekly - 2 sets - 8 reps - Long Sitting Calf Stretch with Strap  - 1 x daily - 7 x weekly - 1 sets - 10 reps - 10s hold  ASSESSMENT:  CLINICAL IMPRESSION: Kendra Norman had a slight setback but discomfort is mild.  She actually had one day where she was pain free. Start up and gait was slightly antalgic when coming in to gym from lobby.   We did less balance activity today to allow the tendon to rest.  We focused on tissue mobility and  stretching to reduce soreness. We ended session with ice as well.   Kendra Norman will benefit from skilled PT to address the below impairments and improve overall function.   OBJECTIVE IMPAIRMENTS: Abnormal gait, decreased balance, difficulty walking, decreased ROM, decreased strength, increased fascial restrictions, increased muscle spasms, impaired flexibility, improper body mechanics, postural dysfunction, and pain.   ACTIVITY LIMITATIONS: carrying, lifting, bending, standing, squatting, sleeping, stairs, transfers, bed mobility, bathing, toileting, dressing, and caring for others  PARTICIPATION LIMITATIONS: meal prep, cleaning, laundry, driving, shopping, community activity, occupation, and yard  work  PERSONAL FACTORS: Age and Past/current experiences are also affecting Kendra Norman's functional outcome.   REHAB POTENTIAL: Good  CLINICAL DECISION MAKING: Stable/uncomplicated  EVALUATION COMPLEXITY: Low   GOALS: Goals reviewed with Kendra Norman? Yes  SHORT TERM GOALS: Target date: 01/07/2024  Pain report to be no greater than 4/10  Baseline: Goal status: met 12/3  2.  Kendra Norman will be independent with initial HEP  Baseline:  Goal status:met 12/3   LONG TERM GOALS: Target date: 02/04/2024  Kendra Norman to report pain no greater than 2/10  Baseline:  Goal status: MET 01/14/24  2.  Kendra Norman to be independent with advanced HEP  Baseline:  Goal status: In progress  3.  Functional scores to improve by 2-3 seconds Baseline:  Goal status: INITIAL  4.  LEFS to improve to 66/80 or better Baseline:  Goal status: INITIAL  5.  Kendra Norman to be able to ambulate with normal heel to toe progression with no a.d. and no boot Baseline:  Goal status: MET 02/11/24  6.  Kendra Norman to be able to ascend and descend steps without pain or no greater than 2/10  Baseline:  Goal status: In progress   PLAN:  PT FREQUENCY: 2x/week  PT DURATION: 8 weeks  PLANNED INTERVENTIONS: 97110-Therapeutic exercises, 97530- Therapeutic activity, [id number]- Neuromuscular re-education, 97535- Self Care, [id number]- Manual therapy, [phone]- Gait training, [phone]- Canalith repositioning, [id number]- Aquatic Therapy, [phone]- Electrical stimulation (unattended), [phone]- Electrical stimulation (manual), [id number]- Vasopneumatic device, [id number]- Ultrasound, [id number]- Ionotophoresis 4mg /ml Dexamethasone, 20560 (1-2 muscles), 20561 (3+ muscles)- Dry Needling, Kendra Norman/Family education, Balance training, Stair training, Taping, Joint mobilization, Scar mobilization, Vestibular training, DME instructions, Cryotherapy, and Moist heat  PLAN FOR NEXT SESSION: Nustep, progress ankle ROM, functional training, hip strengthening to assist with good alignment and  ankle support. Gait training with shoe; vasocompression; single leg balance;(Using [employer] [employer] achilles repair protocol) monitor pain   Scotti Motter B. Freddrick Gladson, PT 02/13/24 9:37 AM [facility] [address], [address], [place] [address] Phone # [phone] Fax [phone]  "

## 2024-02-17 ENCOUNTER — Ambulatory Visit: Admitting: Physical Therapy

## 2024-02-18 ENCOUNTER — Encounter: Payer: Self-pay | Admitting: Physical Therapy

## 2024-02-18 ENCOUNTER — Ambulatory Visit: Admitting: Physical Therapy

## 2024-02-18 DIAGNOSIS — M6281 Muscle weakness (generalized): Secondary | ICD-10-CM

## 2024-02-18 DIAGNOSIS — M25572 Pain in left ankle and joints of left foot: Secondary | ICD-10-CM

## 2024-02-18 DIAGNOSIS — R293 Abnormal posture: Secondary | ICD-10-CM

## 2024-02-18 DIAGNOSIS — M25672 Stiffness of left ankle, not elsewhere classified: Secondary | ICD-10-CM

## 2024-02-18 DIAGNOSIS — R252 Cramp and spasm: Secondary | ICD-10-CM

## 2024-02-18 DIAGNOSIS — R262 Difficulty in walking, not elsewhere classified: Secondary | ICD-10-CM

## 2024-02-18 NOTE — Therapy (Signed)
 " OUTPATIENT PHYSICAL THERAPY LOWER EXTREMITY TREATMENT    Patient Name: Kendra Norman MRN: [id number] DOB:06/16/1955, 69 y.o., female Today's Date: 02/18/2024  END OF SESSION:  PT End of Session - 02/18/24 1150     Visit Number 17    Date for Recertification  03/27/24    Authorization Type Medicare    Progress Note Due on Visit 20    PT Start Time 1018    PT Stop Time 1112    PT Time Calculation (min) 54 min    Activity Tolerance Patient tolerated treatment well    Behavior During Therapy WFL for tasks assessed/performed               Past Medical History:  Diagnosis Date   GERD (gastroesophageal reflux disease)    Seasonal allergies    Past Surgical History:  Procedure Laterality Date   ABDOMINAL HYSTERECTOMY     BREAST BIOPSY     BREAST EXCISIONAL BIOPSY Bilateral    fibroadenomas - benign   BREAST LUMPECTOMY Bilateral    fibroadenomas - benign   VEIN SURGERY     Patient Active Problem List   Diagnosis Date Noted   Strain of muscle, fascia and tendon of lower back, initial encounter 10/16/2018    PCP: Debrah Josette ORN., PA-C  REFERRING PROVIDER: Silva Juliene SAUNDERS, DPM  REFERRING DIAG: Left achilles rupture  THERAPY DIAG:  Pain in left ankle and joints of left foot  Stiffness of left ankle, not elsewhere classified  Muscle weakness (generalized)  Difficulty in walking, not elsewhere classified  Cramp and spasm  Abnormal posture  Rationale for Evaluation and Treatment: Rehabilitation  ONSET DATE: 12/10/23  SUBJECTIVE:   SUBJECTIVE STATEMENT: Patient reports she is doing okay today since she rested all weekend. She was a little sore and painful after last session, but by Friday she was better.      PERTINENT HISTORY: Right achilles rupture and repair  PAIN: 02/13/2024 Are you having pain? Yes: NPRS scale: 2-3/10 Pain location: none Pain description: none Aggravating factors: weight bearing Relieving factors: rest, ice  PRECAUTIONS:  Fall  RED FLAGS: None   WEIGHT BEARING RESTRICTIONS: MD   FALLS:  Has patient fallen in last 6 months? No  LIVING ENVIRONMENT: Lives with: lives with their spouse Lives in: House/apartment Stairs: built a ramp and armed forces logistics/support/administrative officer is on same level Has following equipment at home: knee scooter  OCCUPATION: retired  PLOF: Independent, Independent with basic ADLs, Independent with household mobility without device, Independent with community mobility without device, Independent with homemaking with ambulation, Independent with gait, and Independent with transfers  PATIENT GOALS: to be able to return to her usual workouts and walk normally; return to golf  NEXT MD VISIT: 12/17/23 (surgery 11/04/23)  OBJECTIVE:  Note: Objective measures were completed at Evaluation unless otherwise noted.  DIAGNOSTIC FINDINGS: na  PATIENT SURVEYS:  LEFS  Extreme difficulty/unable (0), Quite a bit of difficulty (1), Moderate difficulty (2), Little difficulty (3), No difficulty (4) Survey date:  12/10/23 01/20/24 01/30/24  Score total:  20/80 47/80 48/80      COGNITION: Overall cognitive status: Within functional limits for tasks assessed     SENSATION: WFL   POSTURE: Bilateral knee valgus  PALPATION: na  LOWER EXTREMITY ROM:  Active ROM Right eval Left eval Left 01/30/24  Ankle dorsiflexion  90 8  Ankle plantarflexion  40 45  Ankle inversion  32 32  Ankle eversion  10 22   (Blank rows = not tested)  LOWER EXTREMITY MMT: deferred due to acuity  MMT Right eval Left eval Left  01/30/24  Ankle dorsiflexion   4  Ankle plantarflexion   3+  Ankle inversion   4  Ankle eversion   4   (Blank rows = not tested)  LOWER EXTREMITY SPECIAL TESTS:  Ankle special tests: na  FUNCTIONAL TESTS:  5 times sit to stand: 11.16 sec Timed up and go (TUG): 19.19 sec  01/20/2024 5STS 9.89 sec no UE support TUG:7.17 sec 6MWT:1231FT (increased pain at halfway mark; antalgic gait)- age expected  1345.4 ft  01/30/24 5STS 9.01 sec no UE support TUG: 5.67 sec : held due to currently exacerbated  GAIT: Distance walked: 30 FEET Assistive device utilized: knee scooter Level of assistance: Complete Independence Comments: enjoys walking, golfing and working out  [employer]: 12 figure 8                                                                                                                              TREATMENT DATE:  02/18/2024 (12 weeks post op) NuStep Level 6 5 mins- PT present to discuss status Rocker board x 2 min Gastroc stretch on rocker board with vc's for gentle stretch x 10 holding 10 sec Soleus stretch on rocker board with vc's for VERY gentle stretch x 10 holding 10 sec Standing short foot x 20 Yellow loop under left big toe anchored with KB + march with right 2 x 10 Side stepping on airex beam x 3 laps (no shoes) then iso heel raise hold x 3 laps (one full 1 min rest in between)- no pain just fatiguing  Seated ankle inversion/eversion with half roll x 2 min Standing on Lt + right leg up and over orange hurdle x 20 Seated heel raise 10# KB x 20 Tandem around the worlds with 10# KB x 10 each direction bilateral  Review and update of HEP Ice pack to left heel in prone with toes hanging off edge of table x 10 min   02/13/2024 (12 weeks post op) Recumbent bike x 5 min level 4  (PT present to discuss status) Rocker board x 2 min Gastroc stretch on rocker board with vc's for gentle stretch x 10 holding 10 sec Soleus stretch on rocker board with vc's for VERY gentle stretch x 10 holding 10 sec Seated round black BAPS board x 20 DF/PF then INV/EV x 20 Seated short foot x 20 Seated 4 way ankle tband with instructions to only do very light resistance on PF (red) left Foot roller x 2 min (half sitting and half standing) Standing ankle inversion/eversion with half roll x 2 min Seated 4 way ankle tband with instructions to only do very light resistance on PF (red)  left Manual STM to plantar fascia and gastroc/soleus, PROM left ankle and foot, cross friction massage to left heel area. Discussed/educated on how to proceed over the weekend, to allow the inflammation to settle down again.  Use boot if going out for long periods of time or when she will be standing in one place for long periods of time.     Ice pack to left heel in prone with toes hanging off edge of table x 10 min  02/11/2024 (12 weeks post op) Recumbent bike x 5 min level 4  (PT present to discuss status) Rocker board x 2 min Bilateral heel raises x 20 with approx 75% weight on left Standing gastroc stretch 5  x 10 sec Standing soleus stretch 5 x 10 using rocker board  Standing ankle inversion/eversion with half roll x 2 min Lunge + march x 10 with unilateral UE support x 10 on Rt Left leg on therapad + right leg swings (forward,side, back) x 10 each direction (light UE support) Golf simulation swings x 10 to assess tolerance and form Step up and hold on airex x 10 fwd and then x 10 lateral (occasional UE support) left only Lunge to BOSU x 10 fwd then x 10 lateral (left) Cone touches 3 x 10 (cone on counter top, standing on left LE on floor) Lateral band walks for hip strength and stability to protect ankle and foot with blue loop x 3 laps of 15 feet Standing hip ER with blue band 3 x 10 Side lying clam with blue band 2 x 10 each side Seated 4 way ankle tband with instructions to only do very light resistance on PF (red) left Ice pack to left heel in prone with toes hanging off edge of table x 10 min  02/06/2024 (12 weeks post op) Recumbent bike x 5 min level 4  (PT present to discuss status) Rocker board x 2 min Bilateral heel raises x 20 with approx 50% weight on left Standing ankle inversion/eversion with half roll x 2 min Lunge + march x 10 with unilateral UE support x 10 on Rt Left leg on therapad + right leg swings (forward,side, back) x 10 each direction (light UE  support) Step up and hold on airex x 10 fwd and then x 10 lateral (occasional UE support) left only Lunge to BOSU x 10 fwd then x 10 lateral (left) Seated short foot x 20 Seated towel inv/ev x 10 each side Seated round black BAPS board x 20 DF/PF then INV/EV x 20 Seated 4 way ankle tband with instructions to only do very light resistance on PF (red) left Plantar fascia rolling with ice roller x 2 min Ice pack to left heel in prone with toes hanging off edge of table x 10 min   PATIENT EDUCATION:  Education details: Initiated HEP Person educated: Patient Education method: Programmer, Multimedia, Facilities Manager, Verbal cues, and Handouts Education comprehension: verbalized understanding, returned demonstration, and verbal cues required  HOME EXERCISE PROGRAM: Access Code: [email or web address] URL: [email or web address] Date: 02/18/2024 Prepared by: Kristeen Sar  Exercises - Seated Toe Taps  - 1 x daily - 7 x weekly - 3 sets - 10 reps - Seated Heel Raise  - 1 x daily - 7 x weekly - 3 sets - 10 reps - Seated Arch Lifts  - 1 x daily - 7 x weekly - 3 sets - 10 reps - Seated Ankle Alphabet  - 1 x daily - 7 x weekly - 1 sets - 1 reps - a-z hold - Seated Quad Set  - 1 x daily - 7 x weekly - 1 sets - 20 reps - Seated Heel Raise  - 1 x daily - 7 x weekly - 3  sets - 10 reps - Forward T  - 1 x daily - 7 x weekly - 3 sets - 10 reps - Runner's Lunge  - 1 x daily - 7 x weekly - 2 sets - 8 reps - Long Sitting Calf Stretch with Strap  - 1 x daily - 7 x weekly - 1 sets - 10 reps - 10s hold - Kettlebell Around Body Pass: Around the World  - 1 x daily - 7 x weekly - 1 sets - 10 reps - Arch Lifting  - 1 x daily - 7 x weekly - 2 sets - 10 reps - Sidestepping  - 1 x daily - 7 x weekly - 1 sets - 10 reps - Single Leg Balance with Clock Reach  - 1 x daily - 7 x weekly - 1 sets - 10 reps  ASSESSMENT:  CLINICAL IMPRESSION: Mckensi presents with no current pain or discomfort. She verbalized slick increase in pain last  session, but it did not last long. Today we focused on single leg balance and medial arch strengthening. She did well with all exercises performed today and did not verbalize any pain or discomfort. Patient will be going out of town for two weeks so reviewed and updated HEP to include exercise progressions. PT monitored patient response throughout session and provided cues as needed.  Patient demonstrates good rehab potential to achieve stated goals through skilled therapy intervention.     OBJECTIVE IMPAIRMENTS: Abnormal gait, decreased balance, difficulty walking, decreased ROM, decreased strength, increased fascial restrictions, increased muscle spasms, impaired flexibility, improper body mechanics, postural dysfunction, and pain.   ACTIVITY LIMITATIONS: carrying, lifting, bending, standing, squatting, sleeping, stairs, transfers, bed mobility, bathing, toileting, dressing, and caring for others  PARTICIPATION LIMITATIONS: meal prep, cleaning, laundry, driving, shopping, community activity, occupation, and yard work  PERSONAL FACTORS: Age and Past/current experiences are also affecting patient's functional outcome.   REHAB POTENTIAL: Good  CLINICAL DECISION MAKING: Stable/uncomplicated  EVALUATION COMPLEXITY: Low   GOALS: Goals reviewed with patient? Yes  SHORT TERM GOALS: Target date: 01/07/2024  Pain report to be no greater than 4/10  Baseline: Goal status: met 12/3  2.  Patient will be independent with initial HEP  Baseline:  Goal status:met 12/3   LONG TERM GOALS: Target date: 02/04/2024  Patient to report pain no greater than 2/10  Baseline:  Goal status: MET 01/14/24  2.  Patient to be independent with advanced HEP  Baseline:  Goal status: In progress  3.  Functional scores to improve by 2-3 seconds Baseline:  Goal status: INITIAL  4.  LEFS to improve to 66/80 or better Baseline:  Goal status: INITIAL  5.  Patient to be able to ambulate with normal heel to  toe progression with no a.d. and no boot Baseline:  Goal status: MET 02/11/24  6.  Patient to be able to ascend and descend steps without pain or no greater than 2/10  Baseline:  Goal status: In progress   PLAN:  PT FREQUENCY: 2x/week  PT DURATION: 8 weeks  PLANNED INTERVENTIONS: 97110-Therapeutic exercises, 97530- Therapeutic activity, [id number]- Neuromuscular re-education, 97535- Self Care, [id number]- Manual therapy, [phone]- Gait training, [phone]- Canalith repositioning, [id number]- Aquatic Therapy, [phone]- Electrical stimulation (unattended), [phone]- Electrical stimulation (manual), [id number]- Vasopneumatic device, [id number]- Ultrasound, [id number]- Ionotophoresis 4mg /ml Dexamethasone, 20560 (1-2 muscles), 20561 (3+ muscles)- Dry Needling, Patient/Family education, Balance training, Stair training, Taping, Joint mobilization, Scar mobilization, Vestibular training, DME instructions, Cryotherapy, and Moist heat  PLAN FOR NEXT SESSION: see how she did  being on vacation; progress as patient is able; Nustep, progress ankle ROM, functional training, hip strengthening to assist with good alignment and ankle support. Gait training with shoe; vasocompression; single leg balance;(Using [facility] [employer] achilles repair protocol) monitor pain   Kristeen Sar, PT, DPT 02/18/24 11:52 AM [facility] [address] [place], [place] [address] Phone # [phone] Fax [phone]  "

## 2024-03-10 ENCOUNTER — Ambulatory Visit

## 2024-03-12 ENCOUNTER — Ambulatory Visit

## 2024-03-17 ENCOUNTER — Ambulatory Visit

## 2024-03-19 ENCOUNTER — Ambulatory Visit

## 2024-03-26 ENCOUNTER — Ambulatory Visit: Admitting: Podiatry
